# Patient Record
Sex: Female | Born: 1980 | Race: White | Hispanic: No | Marital: Single | Smoking: Current every day smoker
Health system: Southern US, Community
[De-identification: ages and names within clinical notes are randomized; demographics above are authoritative.]

## PROBLEM LIST (undated history)

## (undated) DIAGNOSIS — K219 Gastro-esophageal reflux disease without esophagitis: Secondary | ICD-10-CM

## (undated) DIAGNOSIS — N3010 Interstitial cystitis (chronic) without hematuria: Secondary | ICD-10-CM

## (undated) DIAGNOSIS — K5909 Other constipation: Secondary | ICD-10-CM

## (undated) DIAGNOSIS — K2970 Gastritis, unspecified, without bleeding: Secondary | ICD-10-CM

## (undated) DIAGNOSIS — F329 Major depressive disorder, single episode, unspecified: Secondary | ICD-10-CM

## (undated) DIAGNOSIS — Z8742 Personal history of other diseases of the female genital tract: Secondary | ICD-10-CM

## (undated) DIAGNOSIS — F32A Depression, unspecified: Secondary | ICD-10-CM

## (undated) DIAGNOSIS — J45909 Unspecified asthma, uncomplicated: Secondary | ICD-10-CM

## (undated) DIAGNOSIS — Z87442 Personal history of urinary calculi: Secondary | ICD-10-CM

## (undated) DIAGNOSIS — F419 Anxiety disorder, unspecified: Secondary | ICD-10-CM

## (undated) HISTORY — PX: OTHER SURGICAL HISTORY: SHX169

## (undated) HISTORY — DX: Interstitial cystitis (chronic) without hematuria: N30.10

## (undated) HISTORY — DX: Other constipation: K59.09

## (undated) HISTORY — DX: Personal history of urinary calculi: Z87.442

## (undated) HISTORY — DX: Gastritis, unspecified, without bleeding: K29.70

## (undated) HISTORY — DX: Major depressive disorder, single episode, unspecified: F32.9

## (undated) HISTORY — DX: Gastro-esophageal reflux disease without esophagitis: K21.9

## (undated) HISTORY — DX: Depression, unspecified: F32.A

## (undated) HISTORY — PX: OVARIAN CYST SURGERY: SHX726

## (undated) HISTORY — DX: Unspecified asthma, uncomplicated: J45.909

## (undated) HISTORY — DX: Anxiety disorder, unspecified: F41.9

## (undated) HISTORY — DX: Personal history of other diseases of the female genital tract: Z87.42

---

## 2005-12-22 ENCOUNTER — Ambulatory Visit (HOSPITAL_COMMUNITY): Admission: RE | Admit: 2005-12-22 | Discharge: 2005-12-22 | Payer: Self-pay | Admitting: Urology

## 2008-09-25 ENCOUNTER — Ambulatory Visit: Payer: Self-pay | Admitting: Family Medicine

## 2008-10-09 ENCOUNTER — Ambulatory Visit: Payer: Self-pay | Admitting: Family Medicine

## 2009-07-16 ENCOUNTER — Ambulatory Visit: Payer: Self-pay | Admitting: Family Medicine

## 2010-01-15 ENCOUNTER — Ambulatory Visit: Payer: Self-pay | Admitting: Family Medicine

## 2010-08-27 ENCOUNTER — Emergency Department: Payer: Self-pay | Admitting: Emergency Medicine

## 2010-09-24 ENCOUNTER — Emergency Department: Payer: Self-pay | Admitting: Emergency Medicine

## 2010-10-02 ENCOUNTER — Emergency Department: Payer: Self-pay | Admitting: Emergency Medicine

## 2011-04-30 ENCOUNTER — Emergency Department: Payer: Self-pay | Admitting: Emergency Medicine

## 2011-05-01 ENCOUNTER — Emergency Department: Payer: Self-pay | Admitting: Emergency Medicine

## 2011-05-05 LAB — PATHOLOGY REPORT

## 2011-08-19 DIAGNOSIS — R3915 Urgency of urination: Secondary | ICD-10-CM | POA: Insufficient documentation

## 2011-08-19 DIAGNOSIS — N3010 Interstitial cystitis (chronic) without hematuria: Secondary | ICD-10-CM | POA: Insufficient documentation

## 2011-08-19 DIAGNOSIS — R350 Frequency of micturition: Secondary | ICD-10-CM | POA: Insufficient documentation

## 2011-08-19 DIAGNOSIS — R300 Dysuria: Secondary | ICD-10-CM | POA: Insufficient documentation

## 2011-08-19 DIAGNOSIS — IMO0002 Reserved for concepts with insufficient information to code with codable children: Secondary | ICD-10-CM | POA: Insufficient documentation

## 2011-08-19 DIAGNOSIS — R6882 Decreased libido: Secondary | ICD-10-CM | POA: Insufficient documentation

## 2011-08-19 DIAGNOSIS — R351 Nocturia: Secondary | ICD-10-CM | POA: Insufficient documentation

## 2012-04-27 DIAGNOSIS — N9489 Other specified conditions associated with female genital organs and menstrual cycle: Secondary | ICD-10-CM | POA: Insufficient documentation

## 2012-06-29 DIAGNOSIS — R319 Hematuria, unspecified: Secondary | ICD-10-CM | POA: Insufficient documentation

## 2012-06-29 DIAGNOSIS — IMO0001 Reserved for inherently not codable concepts without codable children: Secondary | ICD-10-CM | POA: Insufficient documentation

## 2012-07-14 LAB — HM PAP SMEAR: HM Pap smear: NORMAL

## 2013-02-23 ENCOUNTER — Encounter: Payer: Self-pay | Admitting: *Deleted

## 2013-02-28 ENCOUNTER — Ambulatory Visit: Payer: Self-pay | Admitting: Cardiovascular Disease

## 2013-03-24 ENCOUNTER — Ambulatory Visit: Payer: Self-pay | Admitting: Cardiovascular Disease

## 2013-05-23 ENCOUNTER — Emergency Department: Payer: Self-pay | Admitting: Emergency Medicine

## 2013-05-31 ENCOUNTER — Ambulatory Visit: Payer: Self-pay | Admitting: Cardiovascular Disease

## 2013-05-31 ENCOUNTER — Telehealth: Payer: Self-pay

## 2013-05-31 NOTE — Telephone Encounter (Signed)
Notified patient can come today to see Dr. Elease Hashimoto at 10:15.

## 2013-05-31 NOTE — Telephone Encounter (Signed)
Message copied by Festus Aloe on Wed May 31, 2013  8:17 AM ------      Message from: Tarri Fuller      Created: Tue May 30, 2013  4:57 PM      Regarding: New Pt appt.       Hi Jovonte Commins,            This pt called after you left last night. She said she was supposed to come in a few months ago as a new pt to see Dr. Kirke Corin. Pt states she had cancelled the 1st appt the she was moving and we did not have the new address or phone #. I have updated her address and phone #.             Pt states her PCP Dr. Sherrie Mustache referred her a few months ago. I have scheduled her for 06/09/13 with Arida as New pt appt. However; pt states she has been having chest tightness, sob on and off for the last year; she is also having left arm weakness, rapid HR around 156, low grade fever, elevated BP. I thought you may want to d/w Dr. Mariah Milling and see what he thinks if pt needs to come in sooner; I think she probably does due to her elevated HR. Have a great day!! Was good to see you!            Thank you      Okey Regal  ------

## 2013-06-09 ENCOUNTER — Ambulatory Visit: Payer: Self-pay | Admitting: Cardiovascular Disease

## 2013-06-13 ENCOUNTER — Encounter: Payer: Self-pay | Admitting: Cardiovascular Disease

## 2013-06-13 ENCOUNTER — Ambulatory Visit (INDEPENDENT_AMBULATORY_CARE_PROVIDER_SITE_OTHER): Payer: Medicaid Other | Admitting: Cardiovascular Disease

## 2013-06-13 VITALS — BP 112/74 | HR 85 | Ht 64.5 in | Wt 162.8 lb

## 2013-06-13 DIAGNOSIS — R002 Palpitations: Secondary | ICD-10-CM | POA: Insufficient documentation

## 2013-06-13 DIAGNOSIS — R0789 Other chest pain: Secondary | ICD-10-CM

## 2013-06-13 NOTE — Progress Notes (Signed)
**Note Martha-Identified via Obfuscation** Martha Guerra Date of Birth  19-May-1981       [facility] Office [address]  [address] [place], [place]  [address]   [place], [place]  [address] [phone]     [phone]   Fax  [phone]    Fax [phone]  Problem List: 1. Chest pain 2. GERD 3. Depression 4, anxiety 5. Ovarian cysts   History of Present Illness:  Martha Guerra is a 32 yo with several cardiac complaints.  She has had a rapid HR for years.  She has trouble sleeping at night because of the rapid HR.  She has constant chest heaviness.  She has had a rapid HR on occasion - was measured at 160 in her urologist's office.    She takes her adderall only as needed and her HR is rapid on occasion even when she does not take the adderall.    Current Outpatient Prescriptions on File Prior to Visit  Medication Sig Dispense Refill  . amphetamine-dextroamphetamine (ADDERALL) 10 MG tablet Take 10 mg by mouth 4 (four) times daily - after meals and at bedtime.      Marland Kitchen esomeprazole (NEXIUM) 40 MG capsule Take 40 mg by mouth daily before breakfast.      . lidocaine 2 % SOLN 12.25 mL with EPINEPHrine 1 MG/ML SOLN 0.25 mL once.       Marland Kitchen oxycodone (OXY-IR) 5 MG capsule Take 5 mg by mouth 4 (four) times daily.      . pentosan polysulfate (ELMIRON) 100 MG capsule Take 100 mg by mouth 2 (two) times daily.       No current facility-administered medications on file prior to visit.    Allergies  Allergen Reactions  . Tylenol [Acetaminophen]     Past Medical History  Diagnosis Date  . Depression   . Chronic constipation   . History of ovarian cyst   . History of kidney stones   . Anxiety   . GERD (gastroesophageal reflux disease)   . Asthma   . Gastritis   . Cystitides, interstitial, chronic     Past Surgical History  Procedure Laterality Date  . Cystectomy    . Ovarian cyst surgery      History  Smoking status  . Current Every Day Smoker -- 1.50 packs/day  for 17 years  . Types: Cigarettes  Smokeless tobacco  . Not on file    History  Alcohol Use No    Family History  Problem Relation Age of Onset  . Heart attack Mother   . Hypertension Father     Reviw of Systems:  Reviewed in the HPI.  All other systems are negative.  Physical Exam: Blood pressure 112/74, pulse 85, height 5' 4.5" (1.638 m), weight 162 lb 12 oz (73.823 kg). General: Well developed, well nourished, in no acute distress.  Head: Normocephalic, atraumatic, sclera non-icteric, mucus membranes are moist,   Neck: Supple. Carotids are 2 + without bruits. No JVD   Lungs: Clear   Heart: RR, normal S1, S2  Abdomen: Soft, non-tender, non-distended with normal bowel sounds.  Msk:  Strength and tone are normal   Extremities: No clubbing or cyanosis. No edema.  Distal pedal pulses are 2+ and equal    Neuro: CN II - XII intact.  Alert and oriented X 3.   Psych:  Normal   ECG: June 13, 2013:  NSR . Normal ECG  Assessment / Plan:

## 2013-06-13 NOTE — Assessment & Plan Note (Addendum)
Martha Guerra presents today for further ablation of her palpitations. She states that she's had a fast heart rate for many years.  In talking with her it is clear that she has a very unhealthy living style. Her diet is completely unrestricted. She drinks up to 4 Vanella  milkshakes each day. She used to drink 4 or 5 bottles of [facility]-  now has decreased it to one bottle day. She smokes one half packs of cigarettes a day. She does not get any regular exercise.  I told her about the possibility of supraventricular tachycardia and explained how to slow heart rate down using the Valsalva maneuver.  I truly think that her heart rate in the 90s are due to her unhealthy living so. I've given her information on the [facility] low glycemic index diet. I've counseled her about eating a low-fat low carbohydrate diet. I've explained her that it may take 2 or 3 months for her to change her diet to more healthy diet. I've explained the importance of regular exercise.  I'll see her again in one month for followup visit. She has been successful in correcting her diet and is still having palpitations we will post monitor on her for further evaluation of her arrhythmias. Place monitor her if she has any serious arrhythmias in the meantime.  Told her to expect to go through some withdrawal. 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

## 2013-06-13 NOTE — Patient Instructions (Addendum)
  [facility] Low Glycemic Diet (Source: [facility], 2006) Low Glycemic Foods (20-49) (Decrease risk of developing heart disease) Breakfast Cereals: All-Bran All-Bran Fruit 'n Oats Fiber One Oatmeal (not instant) Oat bran Fruits and fruit juices: (Limit to 1-2 servings per day) Apples Apricots (fresh & dried) Blackberries Blueberries Cherries Cranberries Peaches Pears Plums Prunes Grapefruit Raspberries Strawberries Tangerine Apple juice Grapefruit juice Tomato juice Beans and legumes (fresh-cooked): Black-eyed peas Butter beans Chick peas Lentils  Green beans Lima beans Kidney beans Navy beans Pinto beans Snow peas Non-starchy vegetables: Asparagus, avocado, broccoli, cabbage, cauliflower, celery, cucumber, greens, lettuce, mushrooms, peppers, tomatoes, okra, onions, spinach, summer squash Grains: Barley Bulgur Rye Wild rice Nuts and oils : Almonds Peanuts Sunflower seeds Hazelnuts Pecans Walnuts Oils that are liquid at room temperature Dairy, fish, meat, soy, and eggs: Milk, skim Lowfat cheese Yogurt, lowfat, fruit sugar sweetened Lean red meat Fish  Skinless chicken & [place] Shellfish Egg whites (up to 3 daily) Soy products  Egg yolks (up to 7 or _____ per week) Moderate Glycemic Foods (50-69) Breakfast Cereals: Bran Buds Bran Chex Just Right Mini-Wheats  Special K Swiss muesli Fruits: Banana (under-ripe) Dates Figs Grapes Kiwi Mango Oranges Raisins Fruit Juices: Cranberry juice Orange juice Beans and legumes: Boston-type baked beans Canned pinto, kidney, or navy beans Green peas Vegetables: Beets Carrots  Sweet potato Yam Corn on the cob Breads: Pita (pocket) bread Oat bran bread Pumpernickel bread Rye bread Wheat bread, high fiber  Grains: Cornmeal Rice, brown Rice, white Couscous Pasta: Macaroni Pizza, cheese Ravioli, meat filled Spaghetti, white  Nuts: Cashews Macadamia Snacks: Chocolate Ice cream, lowfat Muffin  Popcorn High Glycemic Foods (70-100)  Breakfast Cereals: Cheerios Corn Chex Corn Flakes Cream of Wheat Grape Nuts Grape Nut Flakes Grits Nutri-Grain Puffed Rice Puffed Wheat Rice Chex Rice Krispies Shredded Wheat Team Total Fruits: Pineapple Watermelon Banana (over-ripe) Beverages: Sodas, sweet tea, pineapple juice Vegetables: Potato, baked, boiled, fried, mashed Jamaica fries Canned or frozen corn Parsnips Winter squash Breads: Most breads (white and whole grain) Bagels Bread sticks Bread stuffing Kaiser roll Dinner rolls Grains: Rice, instant Tapioca, with milk Candy and most cookies Snacks: Donuts Corn chips Jelly beans Pretzels Pastries    Follow-up in 1 month with Dr Elease Hashimoto.

## 2013-06-20 ENCOUNTER — Telehealth: Payer: Self-pay

## 2013-06-20 NOTE — Telephone Encounter (Signed)
Returned call to patient she stated she has changed her diet and is eating more vegetables,no mountain dews,drinking 6 glasses of water a day.Stated her heart rate is much better ranging under 100 beats/min.Stated B/P is ranging 90/54,93/68.Stated she feels fine.Advised to continue to eat a healthy diet and drink plenty of water.Advised to continue to monitor B/P.Message sent to Dr.Nahser for review.

## 2013-06-20 NOTE — Telephone Encounter (Signed)
Returned call to patient Dr.Nahser is pleased with your progress.Continue the good work.

## 2013-06-20 NOTE — Telephone Encounter (Signed)
Pt states dr Elease Hashimoto put her on a special diet, pt states her BP is 90/54, wonders if it is too low. States she feels ok. Just not sure if this reading is normal. Please call.

## 2013-06-20 NOTE — Telephone Encounter (Signed)
Great.  She just needed some guidance.  Tell her that I'm pleased with her progress.

## 2013-07-19 ENCOUNTER — Ambulatory Visit: Payer: Medicaid Other | Admitting: Cardiovascular Disease

## 2013-07-25 ENCOUNTER — Ambulatory Visit: Payer: Medicaid Other | Admitting: Cardiovascular Disease

## 2013-08-16 ENCOUNTER — Ambulatory Visit: Payer: Self-pay | Admitting: Family Medicine

## 2013-11-03 ENCOUNTER — Encounter: Payer: Self-pay | Admitting: Family Medicine

## 2014-03-09 ENCOUNTER — Ambulatory Visit: Payer: Medicaid Other | Admitting: Cardiovascular Disease

## 2014-03-15 ENCOUNTER — Ambulatory Visit: Payer: Self-pay | Admitting: Family Medicine

## 2014-03-27 ENCOUNTER — Ambulatory Visit (INDEPENDENT_AMBULATORY_CARE_PROVIDER_SITE_OTHER): Payer: Medicaid Other | Admitting: Cardiovascular Disease

## 2014-03-27 ENCOUNTER — Encounter: Payer: Self-pay | Admitting: Cardiovascular Disease

## 2014-03-27 VITALS — BP 102/74 | HR 78 | Ht 64.5 in | Wt 164.2 lb

## 2014-03-27 DIAGNOSIS — R079 Chest pain, unspecified: Secondary | ICD-10-CM

## 2014-03-27 DIAGNOSIS — R002 Palpitations: Secondary | ICD-10-CM

## 2014-03-27 NOTE — Patient Instructions (Addendum)
Increase your intake of fluids (water with electrolyte tabs like Nun tablets, or gatorade) , protein ( hard boiled eggs, chicken, fish) , and a electrolytes ( V-8 juice, salt, potassium chloride  which is sold as No-Salt    Follow up as needed

## 2014-03-27 NOTE — Assessment & Plan Note (Signed)
Martha Guerra is feeling much better.  He's completely stopped eating some and sugar. She's not drinking [employer] or  Milkshakes.  She's having fewer palpitations.  Overall she's doing very well. I've encouraged her to stop smoking which I think will also help her quite a bit.  I'll see her on an as-needed basis.

## 2014-03-27 NOTE — Progress Notes (Signed)
Martha Guerra Date of Birth  1981/08/31       [facility] Office [address], [place]  [address]   [place], [place]  [address] [phone]     [phone]   Fax  [phone]    Fax [phone]  Problem List: 1. Chest pain 2. GERD 3. Depression 4, anxiety 5. Ovarian cysts   History of Present Illness:  Martha Guerra is a 33 yo with several cardiac complaints.  She has had a rapid HR for years.  She has trouble sleeping at night because of the rapid HR.  She has constant chest heaviness.  She has had a rapid HR on occasion - was measured at 160 in her urologist's office.    She takes her adderall only as needed and her HR is rapid on occasion even when she does not take the adderall.    March 27, 2014:  She has changed her diet for the better.  She is avoiding sugar,  Avoiding [facility].   She has had lots of stomach issues.  Liver enzymes are elevate.   Slightly better on PPI Does not drink ETOH.    She is still smoking.  Wants to quit.    Current Outpatient Prescriptions on File Prior to Visit  Medication Sig Dispense Refill  . fesoterodine (TOVIAZ) 8 MG TB24 tablet Take 8 mg by mouth as needed.       Marland Kitchen oxycodone (OXY-IR) 5 MG capsule Take 5 mg by mouth 4 (four) times daily.      . pentosan polysulfate (ELMIRON) 100 MG capsule Take 100 mg by mouth 2 (two) times daily.       No current facility-administered medications on file prior to visit.    Allergies  Allergen Reactions  . Tylenol [Acetaminophen]     Past Medical History  Diagnosis Date  . Depression   . Chronic constipation   . History of ovarian cyst   . History of kidney stones   . Anxiety   . GERD (gastroesophageal reflux disease)   . Asthma   . Gastritis   . Cystitides, interstitial, chronic     Past Surgical History  Procedure Laterality Date  . Cystectomy    . Ovarian cyst surgery      History  Smoking status    . Current Every Day Smoker -- 1.50 packs/day for 17 years  . Types: Cigarettes  Smokeless tobacco  . Not on file    History  Alcohol Use No    Family History  Problem Relation Age of Onset  . Heart attack Mother   . Hypertension Father     Reviw of Systems:  Reviewed in the HPI.  All other systems are negative.  Physical Exam: Blood pressure 102/74, pulse 78, height 5' 4.5" (1.638 m), weight 164 lb 4 oz (74.503 kg). General: Well developed, well nourished, in no acute distress.  Head: Normocephalic, atraumatic, sclera non-icteric, mucus membranes are moist,   Neck: Supple. Carotids are 2 + without bruits. No JVD   Lungs: Clear   Heart: RR, normal S1, S2  Abdomen: Soft, non-tender, non-distended with normal bowel sounds.  Msk:  Strength and tone are normal   Extremities: No clubbing or cyanosis. No edema.  Distal pedal pulses are 2+ and equal    Neuro: CN II - XII intact.  Alert and oriented X 3.   Psych:  Normal  ECG: March 27, 2014:   NSR at 12.  Normal ECG   Assessment / Plan:

## 2014-07-13 ENCOUNTER — Ambulatory Visit: Payer: Self-pay | Admitting: Family Medicine

## 2014-07-17 ENCOUNTER — Ambulatory Visit: Payer: Self-pay | Admitting: Family Medicine

## 2014-08-07 DIAGNOSIS — F411 Generalized anxiety disorder: Secondary | ICD-10-CM | POA: Insufficient documentation

## 2014-08-07 DIAGNOSIS — F17200 Nicotine dependence, unspecified, uncomplicated: Secondary | ICD-10-CM | POA: Insufficient documentation

## 2014-08-07 DIAGNOSIS — J449 Chronic obstructive pulmonary disease, unspecified: Secondary | ICD-10-CM | POA: Insufficient documentation

## 2014-08-07 DIAGNOSIS — R7401 Elevation of levels of liver transaminase levels: Secondary | ICD-10-CM | POA: Insufficient documentation

## 2014-08-07 DIAGNOSIS — F410 Panic disorder [episodic paroxysmal anxiety] without agoraphobia: Secondary | ICD-10-CM | POA: Insufficient documentation

## 2014-08-07 DIAGNOSIS — M549 Dorsalgia, unspecified: Secondary | ICD-10-CM | POA: Insufficient documentation

## 2014-08-07 DIAGNOSIS — K219 Gastro-esophageal reflux disease without esophagitis: Secondary | ICD-10-CM | POA: Insufficient documentation

## 2014-08-07 DIAGNOSIS — F329 Major depressive disorder, single episode, unspecified: Secondary | ICD-10-CM | POA: Insufficient documentation

## 2014-08-07 DIAGNOSIS — F319 Bipolar disorder, unspecified: Secondary | ICD-10-CM | POA: Insufficient documentation

## 2014-08-07 DIAGNOSIS — R1011 Right upper quadrant pain: Secondary | ICD-10-CM | POA: Insufficient documentation

## 2014-08-07 DIAGNOSIS — K5900 Constipation, unspecified: Secondary | ICD-10-CM | POA: Insufficient documentation

## 2014-08-07 DIAGNOSIS — R7402 Elevation of levels of lactic acid dehydrogenase (LDH): Secondary | ICD-10-CM | POA: Insufficient documentation

## 2014-08-07 DIAGNOSIS — R740 Nonspecific elevation of levels of transaminase and lactic acid dehydrogenase [LDH]: Secondary | ICD-10-CM

## 2014-08-15 ENCOUNTER — Ambulatory Visit: Payer: Self-pay | Admitting: Gastroenterology

## 2014-09-20 ENCOUNTER — Ambulatory Visit: Payer: Self-pay | Admitting: Gastroenterology

## 2014-10-09 ENCOUNTER — Ambulatory Visit: Payer: Self-pay | Admitting: Gastroenterology

## 2014-10-25 DIAGNOSIS — Z79891 Long term (current) use of opiate analgesic: Secondary | ICD-10-CM | POA: Insufficient documentation

## 2014-10-25 DIAGNOSIS — R82998 Other abnormal findings in urine: Secondary | ICD-10-CM | POA: Insufficient documentation

## 2014-11-12 ENCOUNTER — Ambulatory Visit: Payer: Self-pay

## 2014-11-13 DIAGNOSIS — M797 Fibromyalgia: Secondary | ICD-10-CM | POA: Insufficient documentation

## 2015-01-07 ENCOUNTER — Ambulatory Visit: Payer: Self-pay | Admitting: Gastroenterology

## 2015-02-11 LAB — SURGICAL PATHOLOGY

## 2015-05-01 ENCOUNTER — Telehealth: Payer: Self-pay | Admitting: Family Medicine

## 2015-05-01 MED ORDER — FLUCONAZOLE 150 MG PO TABS: 150.0000 mg | ORAL_TABLET | Freq: Once | ORAL | Status: DC

## 2015-05-01 NOTE — Telephone Encounter (Signed)
Have sent rx diflucan to [facility] [place]. Ov if not better in 4-5 days

## 2015-05-01 NOTE — Telephone Encounter (Signed)
Pt wants to know if you can call her in something for yeast infection.  Her call back is [place].  Thanks, Con Memos

## 2015-05-01 NOTE — Telephone Encounter (Signed)
Advised patient that prescription has been sent into the pharmacy.

## 2015-05-03 ENCOUNTER — Telehealth: Payer: Self-pay | Admitting: Family Medicine

## 2015-05-03 DIAGNOSIS — B373 Candidiasis of vulva and vagina: Secondary | ICD-10-CM

## 2015-05-03 DIAGNOSIS — B3731 Acute candidiasis of vulva and vagina: Secondary | ICD-10-CM

## 2015-05-03 MED ORDER — TERCONAZOLE 0.4 % VA CREA: 1.0000 | TOPICAL_CREAM | Freq: Every day | VAGINAL | Status: DC

## 2015-05-03 NOTE — Telephone Encounter (Signed)
Sent rx. Please notify patient. Thanks.   

## 2015-05-03 NOTE — Telephone Encounter (Signed)
Pt states she was given a Rx fluconazole (DIFLUCAN) 150 MG for yeast infection and pt took this yesterday.  Pt states about 10 minutes after she took the Rx her stomach started hurting so she made herself throw up to get it off of her stomach.  Pt is requesting a cream or a suppository to help with a yeast infection.  [facility] [place].  [phone]

## 2015-05-16 ENCOUNTER — Other Ambulatory Visit: Payer: Self-pay | Admitting: Family Medicine

## 2015-05-16 NOTE — Telephone Encounter (Signed)
Pt contacted office for refill request on the following medications: Alprazolam 2 mg. Pt stated she contacted [facility] to get her last refill and was advised that she doesn't have any more refills. Pt stated that the pharmacy has new owners and new computer systems and thinks that may be the problem. Pt would like [place] to send in new RX to contact the pharmacy so she can get the last refill. Thanks TNP

## 2015-05-17 NOTE — Telephone Encounter (Signed)
Phoned in to pharmacy. 

## 2015-05-17 NOTE — Telephone Encounter (Signed)
Refill request for Alprazolam 2 mg Last filled by MD on- 03/08/2015 #90 x3 Last Appt: 11/03/2014 Next Appt: none Please advise refill? [place] stated that pt picked-up last refill 05/16/2015.

## 2015-05-17 NOTE — Telephone Encounter (Signed)
It's OK to add 3 additional refills to this prescription so she can have it refilled when it is time. This is not an approval to Refill it Early if it was just filled on 05-16-15. Thanks.

## 2015-05-23 ENCOUNTER — Telehealth: Payer: Self-pay | Admitting: Family Medicine

## 2015-05-23 NOTE — Telephone Encounter (Signed)
Pt called thinking she has intestinal parasite.  Please advise    Thanks,  Con Memos

## 2015-05-23 NOTE — Telephone Encounter (Signed)
Patient called back and stated that her cousin, who has stayed at her house recently was diagnosed with pinworms. Patient said that she did the tape test and found more worms.

## 2015-05-23 NOTE — Telephone Encounter (Signed)
Returned pt's call. Patient stated that she found a worm in her stool this morning. Patient wanted to know want she should do?

## 2015-05-24 NOTE — Telephone Encounter (Signed)
LMOVM for pt to return call 

## 2015-05-24 NOTE — Telephone Encounter (Signed)
She needs to try OTC Pin-X. This is more effective and safer than prescription medications.

## 2015-05-24 NOTE — Telephone Encounter (Signed)
i spoke to patient and told her about the pin x otc.  She said she would try that.  Thanks Con Memos

## 2015-05-28 ENCOUNTER — Telehealth: Payer: Self-pay | Admitting: Family Medicine

## 2015-05-28 MED ORDER — ALBENDAZOLE 200 MG PO TABS: ORAL_TABLET | ORAL | Status: DC

## 2015-05-28 NOTE — Telephone Encounter (Signed)
Cancelled Rx at [facility]. Left message for patient saying that Rx was ready.

## 2015-05-28 NOTE — Telephone Encounter (Signed)
She wants to know if you will please call in something for the pinworms. She has medicaid and does not have the money to buy the over the counter RX.  [facility] [place].    [facility]

## 2015-05-28 NOTE — Telephone Encounter (Signed)
[facility] [place]. Also sent sent prescription to [facility] which was her default pharmacy in [place]. Please cancel the prescription that was sent to [facility] drug. Thanks.

## 2015-06-26 ENCOUNTER — Ambulatory Visit: Payer: Self-pay | Admitting: Family Medicine

## 2015-07-15 ENCOUNTER — Other Ambulatory Visit: Payer: Self-pay

## 2015-07-15 ENCOUNTER — Ambulatory Visit: Payer: Self-pay | Admitting: Gastroenterology

## 2015-07-15 DIAGNOSIS — K2970 Gastritis, unspecified, without bleeding: Secondary | ICD-10-CM | POA: Insufficient documentation

## 2015-07-15 DIAGNOSIS — G4700 Insomnia, unspecified: Secondary | ICD-10-CM | POA: Insufficient documentation

## 2015-07-15 DIAGNOSIS — Z87898 Personal history of other specified conditions: Secondary | ICD-10-CM | POA: Insufficient documentation

## 2015-07-15 DIAGNOSIS — Z87442 Personal history of urinary calculi: Secondary | ICD-10-CM | POA: Insufficient documentation

## 2015-08-15 ENCOUNTER — Ambulatory Visit: Payer: Self-pay | Admitting: Family Medicine

## 2015-08-16 ENCOUNTER — Ambulatory Visit: Payer: Self-pay | Admitting: Family Medicine

## 2015-08-20 ENCOUNTER — Other Ambulatory Visit: Payer: Self-pay

## 2015-08-20 ENCOUNTER — Telehealth: Payer: Self-pay | Admitting: Gastroenterology

## 2015-08-20 DIAGNOSIS — K5900 Constipation, unspecified: Secondary | ICD-10-CM

## 2015-08-20 MED ORDER — LINACLOTIDE 290 MCG PO CAPS: 290.0000 ug | ORAL_CAPSULE | Freq: Every day | ORAL | Status: DC

## 2015-08-20 NOTE — Telephone Encounter (Signed)
patient would like a prescription of linzess 290 called into pharmacy [facility] [place], please call patient once it has bene done

## 2015-08-20 NOTE — Telephone Encounter (Signed)
Rx for Linzess 290mg  sent to pt's pharmacy and pt notified this was done.

## 2015-08-27 ENCOUNTER — Other Ambulatory Visit: Payer: Self-pay

## 2015-08-28 ENCOUNTER — Encounter (INDEPENDENT_AMBULATORY_CARE_PROVIDER_SITE_OTHER): Payer: Self-pay

## 2015-08-28 ENCOUNTER — Encounter: Payer: Self-pay | Admitting: Gastroenterology

## 2015-08-28 ENCOUNTER — Ambulatory Visit (INDEPENDENT_AMBULATORY_CARE_PROVIDER_SITE_OTHER): Payer: Medicaid Other | Admitting: Gastroenterology

## 2015-08-28 VITALS — BP 115/69 | HR 84 | Temp 98.4°F | Ht 61.5 in | Wt 168.0 lb

## 2015-08-28 DIAGNOSIS — R1011 Right upper quadrant pain: Secondary | ICD-10-CM

## 2015-08-28 NOTE — Progress Notes (Signed)
Primary Care Physician: Lelon Huh, MD  Primary Gastroenterologist:  Dr. Lucilla Lame  Chief Complaint  Patient presents with  . Abdominal Pain  . Nausea    HPI: Martha Guerra is a 34 y.o. female here off of her right-sided abdominal pain. The patient thinks it may be something wrong with her liver. The patient reports that she has the nausea with the abdominal pain. With that she took anti-inflammatory medication use warm compresses when she saw me last for the same abdominal pain. She reports that it did not help her symptoms much. As chronic back pain also.  Current Outpatient Prescriptions  Medication Sig Dispense Refill  . alprazolam (XANAX) 2 MG tablet TAKE 1/2 TABLET BY MOUTH FOUR TIMES A DAY AND 1 TABLET AT BEDTIME AS NEEDED  3  . amphetamine-dextroamphetamine (ADDERALL) 10 MG tablet Take by mouth.    . busPIRone (BUSPAR) 15 MG tablet   4  . fesoterodine (TOVIAZ) 8 MG TB24 tablet Take 8 mg by mouth as needed.     . lamoTRIgine (LAMICTAL) 100 MG tablet   4  . lidocaine (XYLOCAINE) 2 % jelly 1 application as needed.    . Linaclotide (LINZESS) 290 MCG CAPS capsule Take 1 capsule (290 mcg total) by mouth daily. 30 capsule 6  . oxycodone (OXY-IR) 5 MG capsule Take 5 mg by mouth 4 (four) times daily.    . pentosan polysulfate (ELMIRON) 100 MG capsule Take 100 mg by mouth 2 (two) times daily.    . TRINTELLIX 20 MG TABS   4  . albendazole (ALBENZA) 200 MG tablet 2 tablets at once, repeat in 2 weeks (Patient not taking: Reported on 08/28/2015) 4 tablet 0  . fluconazole (DIFLUCAN) 150 MG tablet Take 1 tablet (150 mg total) by mouth once. (Patient not taking: Reported on 08/28/2015) 1 tablet 0  . PROAIR HFA 108 (90 BASE) MCG/ACT inhaler TAKE 2 PUFFS BY MOUTH INTO LUNGS EVERY 6 HOURS AS NEEDED FOR WHEEZING  12  . terconazole (TERAZOL 7) 0.4 % vaginal cream Place 1 applicator vaginally at bedtime. (Patient not taking: Reported on 08/28/2015) 45 g 0   No current facility-administered  medications for this visit.    Allergies as of 08/28/2015 - Review Complete 08/28/2015  Allergen Reaction Noted  . Tylenol [acetaminophen]  02/23/2013    ROS:  General: Negative for anorexia, weight loss, fever, chills, fatigue, weakness. ENT: Negative for hoarseness, difficulty swallowing , nasal congestion. CV: Negative for chest pain, angina, palpitations, dyspnea on exertion, peripheral edema.  Respiratory: Negative for dyspnea at rest, dyspnea on exertion, cough, sputum, wheezing.  GI: See history of present illness. GU:  Negative for dysuria, hematuria, urinary incontinence, urinary frequency, nocturnal urination.  Endo: Negative for unusual weight change.    Physical Examination:   BP 115/69 mmHg  Pulse 84  Temp(Src) 98.4 F (36.9 C) (Oral)  Ht 5' 1.5" (1.562 m)  Wt 168 lb (76.204 kg)  BMI 31.23 kg/m2  General: Well-nourished, well-developed in no acute distress.  Eyes: No icterus. Conjunctivae pink. Mouth: Oropharyngeal mucosa moist and pink , no lesions erythema or exudate. Lungs: Clear to auscultation bilaterally. Non-labored. Heart: Regular rate and rhythm, no murmurs rubs or gallops.  Abdomen: The patient's tenderness was reproducible with out touching the abdominal wall and only lifting the leg 6 inches above the exam table. One finger palpation was then applied to the right side of the abdomen which resulted in excruciating tenderness Extremities: No lower extremity edema. No clubbing or deformities.  Neuro: Alert and oriented x 3.  Grossly intact. Skin: Warm and dry, no jaundice.   Psych: Alert and cooperative, normal mood and affect.  Labs:    Imaging Studies: No results found.  Assessment and Plan:   Martha Guerra is a 34 y.o. y/o female who comes in today with continued abdominal pain that was reproduced with out touching the patient but by merely lifting the patient's legs above the exam table thereby flexing the abdominal wall muscles. After doing  this the pain continued and she started to have nausea from the pain. The patient also has chronic back pain. I will check the patient's liver enzymes as per her request because of the right-sided abdominal pain although she has been assured that this is 100% musculoskeletal pain.   Note: This dictation was prepared with Dragon dictation along with smaller phrase technology. Any transcriptional errors that result from this process are unintentional.

## 2015-08-29 LAB — HEPATIC FUNCTION PANEL
ALT: 19 IU/L (ref 0–32)
AST: 18 IU/L (ref 0–40)
Albumin: 4.5 g/dL (ref 3.5–5.5)
Alkaline Phosphatase: 79 IU/L (ref 39–117)
Bilirubin Total: 0.3 mg/dL (ref 0.0–1.2)
Bilirubin, Direct: 0.09 mg/dL (ref 0.00–0.40)
Total Protein: 6.9 g/dL (ref 6.0–8.5)

## 2015-08-30 ENCOUNTER — Telehealth: Payer: Self-pay

## 2015-08-30 NOTE — Telephone Encounter (Signed)
Pt notified of lab results

## 2015-08-30 NOTE — Telephone Encounter (Signed)
-----   Message from Lucilla Lame, MD sent at 08/29/2015  9:57 AM EST ----- The patient know her liver enzymes were normal.

## 2015-09-11 ENCOUNTER — Telehealth: Payer: Self-pay | Admitting: Family Medicine

## 2015-09-11 ENCOUNTER — Other Ambulatory Visit: Payer: Self-pay | Admitting: *Deleted

## 2015-09-11 MED ORDER — ALPRAZOLAM 2 MG PO TABS: ORAL_TABLET | ORAL | Status: DC

## 2015-09-11 NOTE — Telephone Encounter (Signed)
Pt calling stating she got a call from [facility] saying her medication got ejected alprazolam Duanne Moron) 2 MG tablet  cause she was using too pharmacies pt states she only use's [facility], Pt states she did get it refilled this morning 09-11-15 but at [facility] not [place], pt states she is calling to let Dr know so the Dr. Lance Muss thinking she abusing this drug. Pt states to please take [facility] out for her pharmacy.  Thanks CC.

## 2015-09-11 NOTE — Telephone Encounter (Signed)
Please call in alprazolam.  

## 2015-09-11 NOTE — Telephone Encounter (Signed)
Rx called in to pharmacy. 

## 2015-09-13 NOTE — Telephone Encounter (Signed)
Please review. [place] [facility] has been listed as Copy.

## 2015-10-07 ENCOUNTER — Other Ambulatory Visit: Payer: Self-pay | Admitting: Family Medicine

## 2015-10-07 NOTE — Telephone Encounter (Signed)
Pt needs needs new refill on her alprazolam Duanne Moron) 2 MG tablet 09/11/15 -- Birdie Sons, MD TAKE 1/2 TABLET BY MOUTH FOUR TIMES   She uses [facility] in [place]  Her call back [phone]  Thanks,  Con Memos

## 2015-10-08 MED ORDER — ALPRAZOLAM 2 MG PO TABS: ORAL_TABLET | ORAL | Status: DC

## 2015-10-08 NOTE — Telephone Encounter (Signed)
Please call in alprazolam.  

## 2015-10-09 NOTE — Telephone Encounter (Signed)
Rx called in to pharmacy. 

## 2015-11-19 ENCOUNTER — Ambulatory Visit: Payer: Medicaid Other | Admitting: Family Medicine

## 2015-11-26 ENCOUNTER — Ambulatory Visit (INDEPENDENT_AMBULATORY_CARE_PROVIDER_SITE_OTHER): Payer: Medicaid Other | Admitting: Family Medicine

## 2015-11-26 ENCOUNTER — Encounter: Payer: Self-pay | Admitting: Family Medicine

## 2015-11-26 VITALS — BP 130/60 | HR 104 | Temp 99.2°F | Resp 16 | Wt 172.0 lb

## 2015-11-26 DIAGNOSIS — R0981 Nasal congestion: Secondary | ICD-10-CM | POA: Diagnosis not present

## 2015-11-26 MED ORDER — FLUTICASONE PROPIONATE 50 MCG/ACT NA SUSP: 2.0000 | Freq: Every day | NASAL | Status: DC

## 2015-11-26 NOTE — Patient Instructions (Signed)
Use OTC nasal SALINE spray every 3-4 hours throughout the day

## 2015-11-26 NOTE — Progress Notes (Signed)
Patient: Martha Guerra Female    DOB: Jul 17, 1981   35 y.o.   MRN: [id number] Visit Date: 11/26/2015  Today's Provider: Lelon Huh, MD   Chief Complaint  Patient presents with  . Nasal Congestion   Subjective:    HPI   Right side nostril has felt stopped-up  for the last 8 months. Feels like she can't breathe through the right side. It is only on the right side. It feels like her nose is caving in. Has frequent bloody mucoid drainage. No drainage. Has not used any OTC medications or nose sprays. She requests ENT referral for evaluation.   Allergies  Allergen Reactions  . Tylenol [Acetaminophen]    Previous Medications   ALPRAZOLAM (XANAX) 2 MG TABLET    TAKE 1/2 TABLET BY MOUTH FOUR TIMES A DAY AND 1 TABLET AT BEDTIME AS NEEDED   AMPHETAMINE-DEXTROAMPHETAMINE (ADDERALL) 10 MG TABLET    Take by mouth.   BUSPIRONE (BUSPAR) 15 MG TABLET       FESOTERODINE (TOVIAZ) 8 MG TB24 TABLET    Take 8 mg by mouth as needed.    LAMOTRIGINE (LAMICTAL) 100 MG TABLET       LIDOCAINE (XYLOCAINE) 2 % JELLY    1 application as needed.   LINACLOTIDE (LINZESS) 290 MCG CAPS CAPSULE    Take 1 capsule (290 mcg total) by mouth daily.   OXYCODONE (OXY-IR) 5 MG CAPSULE    Take 5 mg by mouth 4 (four) times daily.   PENTOSAN POLYSULFATE (ELMIRON) 100 MG CAPSULE    Take 100 mg by mouth 2 (two) times daily.   PROAIR HFA 108 (90 BASE) MCG/ACT INHALER    TAKE 2 PUFFS BY MOUTH INTO LUNGS EVERY 6 HOURS AS NEEDED FOR WHEEZING   TRINTELLIX 20 MG TABS        Review of Systems  Constitutional: Negative for fever, appetite change and fatigue.  HENT: Positive for congestion and sinus pressure. Negative for postnasal drip, rhinorrhea and sore throat.        Occasional ear congestion  Respiratory: Negative for chest tightness, shortness of breath and wheezing.   Cardiovascular: Negative for chest pain and palpitations.  Gastrointestinal: Negative for nausea, vomiting and abdominal pain.  Neurological:  Positive for headaches. Negative for dizziness and weakness.    Social History  Substance Use Topics  . Smoking status: Current Every Day Smoker -- 1.50 packs/day for 17 years    Types: Cigarettes  . Smokeless tobacco: Never Used  . Alcohol Use: No   Objective:   BP 130/60 mmHg  Pulse 104  Temp(Src) 99.2 F (37.3 C) (Oral)  Resp 16  Wt 172 lb (78.019 kg)  SpO2 97%  LMP 11/24/2015  Physical Exam  General Appearance:    Alert, cooperative, no distress  HENT:   bilateral TM normal without fluid or infection, neck without nodes, right frontal sinus tender and nasal mucosa congested.   Eyes:    PERRL, conjunctiva/corneas clear, EOM's intact       Lungs:     Clear to auscultation bilaterally, respirations unlabored  Heart:    Regular rate and rhythm  Neurologic:   Awake, alert, oriented x 3. No apparent focal neurological           defect.           Assessment & Plan:     1. Nasal congestion  - fluticasone (FLONASE) 50 MCG/ACT nasal spray; Place 2 sprays into both nostrils daily.  Dispense: 16 g; Refill: 6  Patient Instructions  Use OTC nasal SALINE spray every 3-4 hours throughout the day   She requests start ENT referral process while trying nasal spray above.  - Ambulatory referral to ENT        Lelon Huh, MD  [facility]

## 2015-12-25 ENCOUNTER — Other Ambulatory Visit: Payer: Self-pay | Admitting: Psychiatry

## 2015-12-25 DIAGNOSIS — M546 Pain in thoracic spine: Secondary | ICD-10-CM

## 2016-01-16 ENCOUNTER — Ambulatory Visit
Admission: RE | Admit: 2016-01-16 | Discharge: 2016-01-16 | Disposition: A | Discharge status: Home / Self Care | Payer: Medicaid Other | Source: Ambulatory Visit | Attending: Psychiatry | Admitting: Psychiatry

## 2016-01-16 DIAGNOSIS — M546 Pain in thoracic spine: Secondary | ICD-10-CM | POA: Insufficient documentation

## 2016-01-24 ENCOUNTER — Ambulatory Visit: Payer: Medicaid Other | Admitting: Family Medicine

## 2016-02-26 ENCOUNTER — Ambulatory Visit (INDEPENDENT_AMBULATORY_CARE_PROVIDER_SITE_OTHER): Payer: Medicaid Other | Admitting: Family Medicine

## 2016-02-26 ENCOUNTER — Encounter: Payer: Self-pay | Admitting: Family Medicine

## 2016-02-26 VITALS — BP 102/60 | HR 89 | Temp 98.6°F | Resp 16 | Wt 175.0 lb

## 2016-02-26 DIAGNOSIS — N3001 Acute cystitis with hematuria: Secondary | ICD-10-CM | POA: Diagnosis not present

## 2016-02-26 DIAGNOSIS — R309 Painful micturition, unspecified: Secondary | ICD-10-CM

## 2016-02-26 DIAGNOSIS — R5383 Other fatigue: Secondary | ICD-10-CM | POA: Diagnosis not present

## 2016-02-26 DIAGNOSIS — N390 Urinary tract infection, site not specified: Secondary | ICD-10-CM | POA: Insufficient documentation

## 2016-02-26 LAB — POCT URINALYSIS DIPSTICK
Bilirubin, UA: NEGATIVE
Glucose, UA: NEGATIVE
Leukocytes, UA: NEGATIVE
Nitrite, UA: NEGATIVE
Spec Grav, UA: 1.025
Urobilinogen, UA: 0.2
pH, UA: 6

## 2016-02-26 MED ORDER — CIPROFLOXACIN HCL 500 MG PO TABS: 500.0000 mg | ORAL_TABLET | Freq: Two times a day (BID) | ORAL | Status: AC

## 2016-02-26 NOTE — Progress Notes (Signed)
Patient: Martha Guerra Female    DOB: Apr 20, 1981   35 y.o.   MRN: [id number] Visit Date: 02/26/2016  Today's Provider: Lelon Huh, MD   Chief Complaint  Patient presents with  . Nausea  . Back Pain   Subjective:    Dysuria  This is a new problem. The current episode started in the past 7 days (3 days ago). The problem occurs every urination. The quality of the pain is described as burning and aching. The pain is at a severity of 4/10. The pain is moderate. There has been no fever. She is not sexually active. Associated symptoms include chills, flank pain, frequency, nausea and urgency. Pertinent negatives include no discharge, hematuria, hesitancy, possible pregnancy, sweats or vomiting. Treatments tried: pain meds. The treatment provided mild relief. Her past medical history is significant for recurrent UTIs.    Painful urination x3 days. Pain in lower abdomen and lower back. Pain is described as burning, aching. Patient stated that urine has strong odor and is cloudy.   Allergies  Allergen Reactions  . Tylenol [Acetaminophen]    Previous Medications   ALPRAZOLAM (XANAX) 2 MG TABLET    TAKE 1/2 TABLET BY MOUTH FOUR TIMES A DAY AND 1 TABLET AT BEDTIME AS NEEDED   AMPHETAMINE-DEXTROAMPHETAMINE (ADDERALL) 10 MG TABLET    Take by mouth.   BUSPIRONE (BUSPAR) 15 MG TABLET       FESOTERODINE (TOVIAZ) 8 MG TB24 TABLET    Take 8 mg by mouth as needed.    LAMOTRIGINE (LAMICTAL) 100 MG TABLET       LIDOCAINE (XYLOCAINE) 2 % JELLY    1 application as needed.   LINACLOTIDE (LINZESS) 290 MCG CAPS CAPSULE    Take 1 capsule (290 mcg total) by mouth daily.   OXYCODONE (OXY-IR) 5 MG CAPSULE    Take 5 mg by mouth 4 (four) times daily.   PENTOSAN POLYSULFATE (ELMIRON) 100 MG CAPSULE    Take 100 mg by mouth 2 (two) times daily.   PROAIR HFA 108 (90 BASE) MCG/ACT INHALER    Reported on 02/26/2016   TRINTELLIX 20 MG TABS        Review of Systems  Constitutional: Positive for chills.  Negative for appetite change and fatigue.  Respiratory: Negative for chest tightness and shortness of breath.   Cardiovascular: Negative for palpitations.  Gastrointestinal: Positive for nausea. Negative for vomiting.  Genitourinary: Positive for dysuria, urgency, frequency, flank pain and vaginal pain. Negative for hesitancy and hematuria.  Neurological: Negative for dizziness.    Social History  Substance Use Topics  . Smoking status: Current Every Day Smoker -- 1.50 packs/day for 17 years    Types: Cigarettes  . Smokeless tobacco: Never Used  . Alcohol Use: No   Objective:   BP 102/60 mmHg  Pulse 89  Temp(Src) 98.6 F (37 C) (Oral)  Resp 16  Wt 175 lb (79.379 kg)  SpO2 98%  LMP 02/18/2016  Physical Exam  General Appearance:    Alert, cooperative, no distress  Eyes:    PERRL, conjunctiva/corneas clear, EOM's intact       Lungs:     Clear to auscultation bilaterally, respirations unlabored  Heart:    Regular rate and rhythm  Abdomen:   soft, round. Mild suprapubic tenderness. BS + x 4. No CVA tenderness      Assessment & Plan:     1. Painful urination  - POCT urinalysis dipstick - Urine culture  2. Acute  cystitis with hematuria  - Urine culture - ciprofloxacin (CIPRO) 500 MG tablet; Take 1 tablet (500 mg total) by mouth 2 (two) times daily.  Dispense: 14 tablet; Refill: 0  3. Other fatigue  - Comprehensive metabolic panel - CBC - TSH       Lelon Huh, MD  [facility]

## 2016-02-27 LAB — COMPREHENSIVE METABOLIC PANEL
ALT: 50 IU/L — ABNORMAL HIGH (ref 0–32)
AST: 31 IU/L (ref 0–40)
Albumin/Globulin Ratio: 1.5 (ref 1.2–2.2)
Albumin: 4 g/dL (ref 3.5–5.5)
Alkaline Phosphatase: 99 IU/L (ref 39–117)
BUN/Creatinine Ratio: 24 — ABNORMAL HIGH (ref 9–23)
BUN: 14 mg/dL (ref 6–20)
Bilirubin Total: <0.2 mg/dL (ref 0.0–1.2)
CO2: 24 mmol/L (ref 18–29)
Calcium: 9.2 mg/dL (ref 8.7–10.2)
Chloride: 99 mmol/L (ref 96–106)
Creatinine, Ser: 0.59 mg/dL (ref 0.57–1.00)
GFR calc Af Amer: 137 mL/min/{1.73_m2} (ref >59)
GFR calc non Af Amer: 119 mL/min/{1.73_m2} (ref >59)
Globulin, Total: 2.7 g/dL (ref 1.5–4.5)
Glucose: 93 mg/dL (ref 65–99)
Potassium: 4.8 mmol/L (ref 3.5–5.2)
Sodium: 137 mmol/L (ref 134–144)
Total Protein: 6.7 g/dL (ref 6.0–8.5)

## 2016-02-27 LAB — TSH: TSH: 0.658 u[IU]/mL (ref 0.450–4.500)

## 2016-02-27 LAB — CBC
Hematocrit: 43.7 % (ref 34.0–46.6)
Hemoglobin: 14.6 g/dL (ref 11.1–15.9)
MCH: 30 pg (ref 26.6–33.0)
MCHC: 33.4 g/dL (ref 31.5–35.7)
MCV: 90 fL (ref 79–97)
Platelets: 317 10*3/uL (ref 150–379)
RBC: 4.86 x10E6/uL (ref 3.77–5.28)
RDW: 13.6 % (ref 12.3–15.4)
WBC: 7.2 10*3/uL (ref 3.4–10.8)

## 2016-02-28 ENCOUNTER — Telehealth: Payer: Self-pay

## 2016-02-28 ENCOUNTER — Other Ambulatory Visit: Payer: Self-pay

## 2016-02-28 DIAGNOSIS — R309 Painful micturition, unspecified: Secondary | ICD-10-CM

## 2016-02-28 DIAGNOSIS — R319 Hematuria, unspecified: Secondary | ICD-10-CM

## 2016-02-28 LAB — URINE CULTURE: Organism ID, Bacteria: NO GROWTH

## 2016-02-28 NOTE — Telephone Encounter (Signed)
Patient advised. Patient is still having pain and would like to proceed with pelvic ultrasound. Please schedule. Order has been placed in patient chart. Thanks.

## 2016-02-28 NOTE — Telephone Encounter (Signed)
Patient advised and verbally voiced understanding.  

## 2016-02-28 NOTE — Telephone Encounter (Signed)
-----   Message from Birdie Sons, MD sent at 02/28/2016  8:35 AM EDT ----- Urine culture is negative. If pain is not better then she needs pelvic ultrasound for pain and hematuria if pain is better she needs to follow up next after finishing antibiotics to recheck urinalysis.

## 2016-02-28 NOTE — Telephone Encounter (Signed)
Patient advised. Patient states she has been taking the Cipro since Tuesday and it has been upsetting her stomach and causing nausea. Patient states in the past, she has not been able to tolerate the Cipro because of the side effects. Patient would like to have the antibiotic switched to [place]. Patient uses [facility] in [place]. Patient is still having pain and agrees to have the pelvic ultrasound as stated below. Order placed for pelvic ultrasound.

## 2016-02-28 NOTE — Telephone Encounter (Signed)
Spoke with Linus Salmons about patient. Normally we like for a course of antibiotics to be completed once started since unfinished courses of antibiotics is what is believed to build antibiotic resistance. However, if she is not tolerating the antibiotic and being that she has no bacterial growth and no signs of infection on CBC she may just discontinue and await pelvic US. No need to start a new antibiotic without cause and risk other antibiotic resistance developing.

## 2016-03-03 ENCOUNTER — Telehealth: Payer: Self-pay | Admitting: Family Medicine

## 2016-03-03 NOTE — Telephone Encounter (Signed)
FYI--Insurance company denied pelvic ultrasound

## 2016-03-12 ENCOUNTER — Ambulatory Visit (INDEPENDENT_AMBULATORY_CARE_PROVIDER_SITE_OTHER): Payer: Medicaid Other | Admitting: Family Medicine

## 2016-03-12 ENCOUNTER — Telehealth: Payer: Self-pay | Admitting: Family Medicine

## 2016-03-12 ENCOUNTER — Encounter: Payer: Self-pay | Admitting: Family Medicine

## 2016-03-12 VITALS — BP 110/72 | HR 100 | Temp 98.5°F | Resp 16 | Wt 174.0 lb

## 2016-03-12 DIAGNOSIS — L709 Acne, unspecified: Secondary | ICD-10-CM

## 2016-03-12 DIAGNOSIS — N63 Unspecified lump in unspecified breast: Secondary | ICD-10-CM

## 2016-03-12 DIAGNOSIS — N6310 Unspecified lump in the right breast, unspecified quadrant: Secondary | ICD-10-CM

## 2016-03-12 DIAGNOSIS — R102 Pelvic and perineal pain: Secondary | ICD-10-CM

## 2016-03-12 LAB — POCT URINALYSIS DIPSTICK
Bilirubin, UA: NEGATIVE
Glucose, UA: NEGATIVE
Ketones, UA: NEGATIVE
Nitrite, UA: NEGATIVE
Spec Grav, UA: 1.02
Urobilinogen, UA: 0.2
pH, UA: 6

## 2016-03-12 MED ORDER — TRETINOIN 0.05 % EX CREA: TOPICAL_CREAM | Freq: Every day | CUTANEOUS | Status: DC

## 2016-03-12 NOTE — Telephone Encounter (Signed)
Please order as requested by radiology. Lump is in right breast at approximately 7 oclock

## 2016-03-12 NOTE — Telephone Encounter (Signed)
Ok to add order  

## 2016-03-12 NOTE — Progress Notes (Signed)
Patient: Martha Guerra Female    DOB: 1981/08/28   35 y.o.   MRN: [id number] Visit Date: 03/12/2016  Today's Provider: Lelon Huh, MD   Chief Complaint  Patient presents with  . Back Pain   Subjective:    Back Pain This is a recurrent problem. The current episode started more than 1 month ago. The problem occurs intermittently. The problem is unchanged. The quality of the pain is described as cramping and aching. The pain does not radiate. The pain is moderate. The symptoms are aggravated by position. Associated symptoms include bladder incontinence, dysuria, headaches and pelvic pain. Pertinent negatives include no abdominal pain, bowel incontinence, chest pain, fever, leg pain, numbness, paresis, paresthesias, perianal numbness, tingling, weakness or weight loss. Treatments tried: pain medications. The treatment provided moderate relief.   Patient was seen 02/26/2016 for painful urination and hematuria. She was started on Cipro, but stopped a few days later since cultures were negative and it was upsetting her stomach. She states that since then she has had persistent moderate to severe lower pelvic pain that is crampy in nature. Mildly nauseated, normal BMs, no vaginal discharge or bleeding. LMP was first week of May and was normal. She is followed by urology for interstitial cystitis and gyn for history of endometriosis. She has follow up scheduled with Dr. Keturah Barre on June 14th. She states she has not been sexually active for many months.   She is also having pain and tenderness in her right breast for about 2 weeks. Feels like there is a knot in her breast.   She also requests prescription for tretinoin for fascial acne   Allergies  Allergen Reactions  . Tylenol [Acetaminophen]    Previous Medications   ALPRAZOLAM (XANAX) 2 MG TABLET    TAKE 1/2 TABLET BY MOUTH FOUR TIMES A DAY AND 1 TABLET AT BEDTIME AS NEEDED   AMPHETAMINE-DEXTROAMPHETAMINE (ADDERALL) 10 MG TABLET    Take  by mouth.   BUSPIRONE (BUSPAR) 15 MG TABLET       FESOTERODINE (TOVIAZ) 8 MG TB24 TABLET    Take 8 mg by mouth as needed.    LAMOTRIGINE (LAMICTAL) 100 MG TABLET       LIDOCAINE (XYLOCAINE) 2 % JELLY    1 application as needed.   LINACLOTIDE (LINZESS) 290 MCG CAPS CAPSULE    Take 1 capsule (290 mcg total) by mouth daily.   OXYCODONE (OXY-IR) 5 MG CAPSULE    Take 5 mg by mouth 4 (four) times daily.   PENTOSAN POLYSULFATE (ELMIRON) 100 MG CAPSULE    Take 100 mg by mouth 2 (two) times daily.   PROAIR HFA 108 (90 BASE) MCG/ACT INHALER    Reported on 02/26/2016   TRINTELLIX 20 MG TABS        Review of Systems  Constitutional: Negative for fever, chills, weight loss, appetite change and fatigue.  Respiratory: Negative for chest tightness and shortness of breath.   Cardiovascular: Negative for chest pain and palpitations.  Gastrointestinal: Negative for nausea, vomiting, abdominal pain and bowel incontinence.  Genitourinary: Positive for bladder incontinence, dysuria and pelvic pain.  Musculoskeletal: Positive for back pain.  Neurological: Positive for headaches. Negative for dizziness, tingling, weakness, numbness and paresthesias.    Social History  Substance Use Topics  . Smoking status: Current Every Day Smoker -- 1.50 packs/day for 17 years    Types: Cigarettes  . Smokeless tobacco: Never Used  . Alcohol Use: No   Objective:  BP 110/72 mmHg  Pulse 100  Temp(Src) 98.5 F (36.9 C) (Oral)  Resp 16  Wt 174 lb (78.926 kg)  SpO2 98%  LMP 02/18/2016  Physical Exam  Breast Exam. Moderate tenderness with subtle nodule right breast, just lateral to areola at about 7 o'clock. No nipple discharge or bleeding  Abd: Upper abd soft, nontender, non-distended with no massed. Moderate supra pubic tenderness. No masses.     Assessment & Plan:     1. Pelvic pain in female  - POCT urinalysis dipstick - US Pelvis Complete; Future  2. Acne, unspecified acne type rx tretinoin 0.05%  cream  3. Lump of right breast U/s and dx mammogram right breast.    Results for orders placed or performed in visit on 03/12/16  POCT urinalysis dipstick  Result Value Ref Range   Color, UA Yellow    Clarity, UA Slightly Cloudy    Glucose, UA Neg    Bilirubin, UA Neg    Ketones, UA Neg    Spec Grav, UA 1.020    Blood, UA Trace    pH, UA 6.0    Protein, UA Trace    Urobilinogen, UA 0.2    Nitrite, UA Neg    Leukocytes, UA small (1+) (A) Negative       The entirety of the information documented in the History of Present Illness, Review of Systems and Physical Exam were personally obtained by me. Portions of this information were initially documented by Meyer Cory, CMA and reviewed by me for thoroughness and accuracy.    Lelon Huh, MD  [facility]

## 2016-03-12 NOTE — Telephone Encounter (Signed)
Please add order for bilateral diagnostic mammogram with ultrasound of right/left breast.[facility] will also need clock position of breast lump

## 2016-03-13 NOTE — Telephone Encounter (Signed)
Orders in Epic.

## 2016-03-17 ENCOUNTER — Telehealth: Payer: Self-pay | Admitting: *Deleted

## 2016-03-17 DIAGNOSIS — Z1239 Encounter for other screening for malignant neoplasm of breast: Secondary | ICD-10-CM

## 2016-03-17 NOTE — Telephone Encounter (Signed)
-----   Message from Parke Poisson sent at 03/17/2016  8:57 AM EDT ----- Please add ultrasound of left breast limited

## 2016-03-17 NOTE — Telephone Encounter (Signed)
Order in epic. 

## 2016-03-17 NOTE — Telephone Encounter (Signed)
-----   Message from Parke Poisson sent at 03/17/2016  9:46 AM EDT ----- [id number] is the  test we need to order

## 2016-03-18 ENCOUNTER — Telehealth: Payer: Self-pay | Admitting: Family Medicine

## 2016-03-18 NOTE — Telephone Encounter (Signed)
Returned call

## 2016-03-18 NOTE — Telephone Encounter (Signed)
Martha Guerra in Ultrasound needs to talk to you about the order that was entered for [name].  [phone]

## 2016-03-19 ENCOUNTER — Ambulatory Visit: Admission: RE | Admit: 2016-03-19 | Payer: Medicaid Other | Source: Ambulatory Visit

## 2016-03-23 ENCOUNTER — Other Ambulatory Visit: Payer: Self-pay | Admitting: *Deleted

## 2016-03-24 ENCOUNTER — Other Ambulatory Visit: Payer: Medicaid Other

## 2016-03-24 ENCOUNTER — Telehealth: Payer: Self-pay | Admitting: Family Medicine

## 2016-03-24 ENCOUNTER — Ambulatory Visit: Admission: RE | Admit: 2016-03-24 | Payer: Medicaid Other | Source: Ambulatory Visit

## 2016-03-24 MED ORDER — ALPRAZOLAM 2 MG PO TABS: ORAL_TABLET | ORAL | Status: DC

## 2016-03-24 NOTE — Telephone Encounter (Signed)
Rx called in to pharmacy. 

## 2016-03-24 NOTE — Telephone Encounter (Signed)
Patient instructed that she needed an appointment, she states that she would wait she didn't want to come in right now.

## 2016-03-24 NOTE — Telephone Encounter (Signed)
Pt states she is having sinus congestion and pressure.  Pt is requesting a Rx to help with a sinus infection.  [facility] [place].  [phone]

## 2016-03-24 NOTE — Telephone Encounter (Signed)
Please call in lorazepam.  

## 2016-03-30 ENCOUNTER — Other Ambulatory Visit: Payer: Self-pay | Admitting: *Deleted

## 2016-03-30 ENCOUNTER — Other Ambulatory Visit: Payer: Self-pay | Admitting: Family Medicine

## 2016-03-30 DIAGNOSIS — R309 Painful micturition, unspecified: Secondary | ICD-10-CM

## 2016-03-30 DIAGNOSIS — R102 Pelvic and perineal pain: Secondary | ICD-10-CM

## 2016-03-30 DIAGNOSIS — R319 Hematuria, unspecified: Secondary | ICD-10-CM

## 2016-03-31 ENCOUNTER — Ambulatory Visit: Admission: RE | Admit: 2016-03-31 | Payer: Medicaid Other | Source: Ambulatory Visit

## 2016-04-01 ENCOUNTER — Ambulatory Visit: Payer: Medicaid Other | Admitting: Obstetrics and Gynecology

## 2016-04-10 ENCOUNTER — Inpatient Hospital Stay: Admission: RE | Admit: 2016-04-10 | Payer: Medicaid Other | Source: Ambulatory Visit

## 2016-04-10 ENCOUNTER — Ambulatory Visit: Payer: Medicaid Other | Attending: Family Medicine

## 2016-04-10 ENCOUNTER — Other Ambulatory Visit: Payer: Medicaid Other

## 2016-04-20 ENCOUNTER — Other Ambulatory Visit: Payer: Self-pay | Admitting: Family Medicine

## 2016-04-20 NOTE — Telephone Encounter (Signed)
Pt contacted office for refill request on the following medications:  alprazolam (XANAX) 2 MG tablet.  [facility].  CB#[phone]/MW

## 2016-04-20 NOTE — Telephone Encounter (Signed)
Refills are available at pharmacy for alprazolam. Confirmed.

## 2016-04-27 ENCOUNTER — Ambulatory Visit: Payer: Medicaid Other | Attending: Family Medicine

## 2016-08-14 ENCOUNTER — Ambulatory Visit: Payer: Medicaid Other | Admitting: Family Medicine

## 2016-09-18 ENCOUNTER — Other Ambulatory Visit: Payer: Self-pay | Admitting: *Deleted

## 2016-09-18 MED ORDER — ALPRAZOLAM 2 MG PO TABS: ORAL_TABLET | ORAL | 5 refills | Status: DC

## 2016-09-18 NOTE — Telephone Encounter (Signed)
Called pharmacy x 2. Phone call keeps getting hung up-aa

## 2016-09-18 NOTE — Telephone Encounter (Signed)
Please call in alprazolam.  

## 2016-09-21 NOTE — Telephone Encounter (Signed)
Rx called in to pharmacy. 

## 2016-10-07 ENCOUNTER — Other Ambulatory Visit: Payer: Self-pay | Admitting: Family Medicine

## 2016-10-07 DIAGNOSIS — K5900 Constipation, unspecified: Secondary | ICD-10-CM

## 2016-10-07 NOTE — Telephone Encounter (Signed)
This medication is prescribed by Dr. Allen Norris. Patient will need to contact him for refills. Looks like he last filled it 08/20/2015 with qty 30 with 6 refills. Tried calling patient to advise her of this. Left a message to call back.

## 2016-10-07 NOTE — Telephone Encounter (Signed)
Pt needs refill on   Linaclotide (LINZESS) 290 MCG CAPS capsule  Taking 08/20/15 -- Lucilla Lame, MD    Take 1 capsule (290 mcg total) by mouth daily.     [facility]

## 2016-10-07 NOTE — Telephone Encounter (Signed)
Please review. Thanks!  

## 2016-10-08 NOTE — Telephone Encounter (Signed)
Patient advised as below.  

## 2016-10-15 ENCOUNTER — Other Ambulatory Visit: Payer: Self-pay

## 2016-10-15 ENCOUNTER — Telehealth: Payer: Self-pay | Admitting: Gastroenterology

## 2016-10-15 DIAGNOSIS — K5900 Constipation, unspecified: Secondary | ICD-10-CM

## 2016-10-15 MED ORDER — LINACLOTIDE 290 MCG PO CAPS: 290.0000 ug | ORAL_CAPSULE | Freq: Every day | ORAL | 6 refills | Status: DC

## 2016-10-15 NOTE — Telephone Encounter (Signed)
Left vm notifying Pt rx for Linzess was sent to pharmacy.

## 2016-10-15 NOTE — Telephone Encounter (Signed)
linzess 290 mcg - Can she get a refill? Her stomach is beginning to bother her again and this is the only thing that helps and she is out. Please call [facility] in [place]

## 2016-12-25 ENCOUNTER — Ambulatory Visit (INDEPENDENT_AMBULATORY_CARE_PROVIDER_SITE_OTHER): Payer: Medicaid Other | Admitting: Family Medicine

## 2016-12-25 ENCOUNTER — Encounter: Payer: Self-pay | Admitting: Family Medicine

## 2016-12-25 VITALS — BP 100/60 | HR 98 | Temp 98.8°F | Resp 16 | Wt 195.0 lb

## 2016-12-25 DIAGNOSIS — R519 Headache, unspecified: Secondary | ICD-10-CM

## 2016-12-25 DIAGNOSIS — R51 Headache: Secondary | ICD-10-CM

## 2016-12-25 DIAGNOSIS — G8929 Other chronic pain: Secondary | ICD-10-CM

## 2016-12-25 MED ORDER — AMITRIPTYLINE HCL 10 MG PO TABS: ORAL_TABLET | ORAL | 1 refills | Status: DC

## 2016-12-25 NOTE — Progress Notes (Signed)
Patient: Martha Guerra Female    DOB: Dec 13, 1980   36 y.o.   MRN: [id number] Visit Date: 12/25/2016  Today's Provider: Lelon Huh, MD   Chief Complaint  Patient presents with  . Headache   Subjective:    Headache   This is a chronic problem. Episode onset: 1 year ago. The problem occurs intermittently. The problem has been gradually worsening. The pain is located in the bilateral region. The pain quality is similar to prior headaches. The quality of the pain is described as throbbing. Pertinent negatives include no abdominal pain, abnormal behavior, anorexia, back pain, blurred vision, coughing, dizziness, drainage, ear pain, eye pain, eye redness, eye watering, facial sweating, fever, hearing loss, insomnia, loss of balance, muscle aches, nausea, neck pain, numbness, phonophobia, photophobia, rhinorrhea, seizures, sinus pressure, sore throat, swollen glands, tingling, tinnitus, visual change, vomiting, weakness or weight loss. The symptoms are aggravated by bright light and emotional stress. Treatments tried: Botox injections. The treatment provided moderate relief.   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 Is now having headaches across both sides of forehead and temples nearly everyday. She is under a lot of stress since her father is dying of bone cancer.      Allergies  Allergen Reactions  . Tylenol [Acetaminophen]      Current Outpatient Prescriptions:  .  alprazolam (XANAX) 2 MG tablet, TAKE 1/2 TABLET BY MOUTH FOUR TIMES A DAY AND 1 TABLET AT BEDTIME AS NEEDED, Disp: 90 tablet, Rfl: 5 .  amphetamine-dextroamphetamine (ADDERALL) 10 MG tablet, Take by mouth., Disp: , Rfl:  .  busPIRone (BUSPAR) 15 MG tablet, , Disp: , Rfl: 4 .  fesoterodine (TOVIAZ) 8 MG TB24 tablet, Take 8 mg by mouth as needed. , Disp: , Rfl:  .  lamoTRIgine (LAMICTAL) 100 MG tablet, , Disp: , Rfl: 4 .   lidocaine (XYLOCAINE) 2 % jelly, 1 application as needed., Disp: , Rfl:  .  linaclotide (LINZESS) 290 MCG CAPS capsule, Take 1 capsule (290 mcg total) by mouth daily., Disp: 30 capsule, Rfl: 6 .  oxycodone (OXY-IR) 5 MG capsule, Take 5 mg by mouth 4 (four) times daily., Disp: , Rfl:  .  pentosan polysulfate (ELMIRON) 100 MG capsule, Take 100 mg by mouth 2 (two) times daily., Disp: , Rfl:  .  tretinoin (RETIN-A) 0.05 % cream, Apply topically at bedtime., Disp: 45 g, Rfl: 1 .  TRINTELLIX 20 MG TABS, , Disp: , Rfl: 4  Review of Systems  Constitutional: Negative for appetite change, chills, fatigue, fever and weight loss.  HENT: Negative for ear pain, hearing loss, rhinorrhea, sinus pressure, sore throat and tinnitus.   Eyes: Negative for blurred vision, photophobia, pain and redness.  Respiratory: Negative for cough, chest tightness and shortness of breath.   Cardiovascular: Negative for chest pain and palpitations.  Gastrointestinal: Negative for abdominal pain, anorexia, nausea and vomiting.  Musculoskeletal: Negative for back pain and neck pain.  Neurological: Positive for headaches. Negative for dizziness, tingling, seizures, weakness, numbness and loss of balance.  Psychiatric/Behavioral: The patient does not have insomnia.     Social History  Substance Use Topics  . Smoking status: Current Every Day Smoker    Packs/day: 0.50    Years: 17.00    Types: Cigarettes  . Smokeless tobacco: Never Used  . Alcohol use No   Objective:   BP 100/60 (BP  Location: Left Arm, Patient Position: Sitting, Cuff Size: Large)   Pulse 98   Temp 98.8 F (37.1 C) (Oral)   Resp 16   Wt 195 lb (88.5 kg)   SpO2 97% Comment: room air  BMI 36.25 kg/m  Vitals:   12/25/16 1129  Resp: 16  Weight: 195 lb (88.5 kg)     Physical Exam   General Appearance:    Alert, cooperative, no distress  Eyes:    PERRL, conjunctiva/corneas clear, EOM's intact       Lungs:     Clear to auscultation bilaterally,  respirations unlabored  Heart:    Regular rate and rhythm  Neurologic:   Awake, alert, oriented x 3. No apparent focal neurological           defect.           Assessment & Plan:     1. Chronic intractable headache, unspecified headache type Responded well to botux injections injection in the past. Will see if we get her with headache clinic for botux injection that takes Medicaid.  In the mean time will start 10mg  TCA and titrate slowly to effective dose. Call if any problems from medication.  - amitriptyline (ELAVIL) 10 MG tablet; Take 1-5 tablets at before bedtime  Dispense: 100 tablet; Refill: 1 - AMB referral to headache clinic       Lelon Huh, MD  [facility]

## 2017-02-05 DIAGNOSIS — R51 Headache: Secondary | ICD-10-CM | POA: Diagnosis not present

## 2017-02-05 DIAGNOSIS — M542 Cervicalgia: Secondary | ICD-10-CM | POA: Diagnosis not present

## 2017-02-05 DIAGNOSIS — M5481 Occipital neuralgia: Secondary | ICD-10-CM | POA: Diagnosis not present

## 2017-02-05 DIAGNOSIS — G245 Blepharospasm: Secondary | ICD-10-CM | POA: Diagnosis not present

## 2017-02-05 LAB — VITAMIN D 25 HYDROXY (VIT D DEFICIENCY, FRACTURES): Vit D, 25-Hydroxy: 7.8

## 2017-03-19 ENCOUNTER — Other Ambulatory Visit: Payer: Self-pay | Admitting: *Deleted

## 2017-03-19 MED ORDER — ALPRAZOLAM 2 MG PO TABS: ORAL_TABLET | ORAL | 5 refills | Status: DC

## 2017-03-19 NOTE — Telephone Encounter (Signed)
Please call in alprazolam.  

## 2017-03-20 NOTE — Telephone Encounter (Signed)
Called in. Martha Guerra, CMA  

## 2017-04-29 ENCOUNTER — Other Ambulatory Visit: Payer: Self-pay | Admitting: Family Medicine

## 2017-04-29 DIAGNOSIS — G8929 Other chronic pain: Secondary | ICD-10-CM

## 2017-04-29 DIAGNOSIS — M545 Low back pain: Principal | ICD-10-CM

## 2017-06-30 ENCOUNTER — Ambulatory Visit
Admission: RE | Admit: 2017-06-30 | Discharge: 2017-06-30 | Disposition: A | Discharge status: Home / Self Care | Payer: Medicaid Other | Source: Ambulatory Visit | Attending: Family Medicine | Admitting: Family Medicine

## 2017-06-30 DIAGNOSIS — M545 Low back pain: Secondary | ICD-10-CM

## 2017-06-30 DIAGNOSIS — G8929 Other chronic pain: Secondary | ICD-10-CM | POA: Diagnosis present

## 2017-06-30 DIAGNOSIS — M5127 Other intervertebral disc displacement, lumbosacral region: Secondary | ICD-10-CM | POA: Diagnosis not present

## 2017-07-09 ENCOUNTER — Ambulatory Visit (INDEPENDENT_AMBULATORY_CARE_PROVIDER_SITE_OTHER): Payer: Medicaid Other | Admitting: Physician Assistant

## 2017-07-09 ENCOUNTER — Encounter: Payer: Self-pay | Admitting: Physician Assistant

## 2017-07-09 VITALS — BP 104/60 | HR 80 | Temp 98.0°F | Wt 187.0 lb

## 2017-07-09 DIAGNOSIS — R062 Wheezing: Secondary | ICD-10-CM

## 2017-07-09 DIAGNOSIS — R918 Other nonspecific abnormal finding of lung field: Secondary | ICD-10-CM | POA: Diagnosis not present

## 2017-07-09 DIAGNOSIS — J40 Bronchitis, not specified as acute or chronic: Secondary | ICD-10-CM | POA: Diagnosis not present

## 2017-07-09 DIAGNOSIS — L301 Dyshidrosis [pompholyx]: Secondary | ICD-10-CM

## 2017-07-09 DIAGNOSIS — J0110 Acute frontal sinusitis, unspecified: Secondary | ICD-10-CM | POA: Diagnosis not present

## 2017-07-09 MED ORDER — ALBUTEROL SULFATE HFA 108 (90 BASE) MCG/ACT IN AERS: 2.0000 | INHALATION_SPRAY | Freq: Four times a day (QID) | RESPIRATORY_TRACT | 2 refills | Status: DC | PRN

## 2017-07-09 MED ORDER — TRIAMCINOLONE ACETONIDE 0.1 % EX CREA: 1.0000 "application " | TOPICAL_CREAM | Freq: Two times a day (BID) | CUTANEOUS | 0 refills | Status: DC

## 2017-07-09 MED ORDER — DOXYCYCLINE HYCLATE 100 MG PO TABS: 100.0000 mg | ORAL_TABLET | Freq: Two times a day (BID) | ORAL | 0 refills | Status: AC

## 2017-07-09 NOTE — Progress Notes (Signed)
[place]  Chief Complaint  Patient presents with  . Sinusitis    Started about two weeks ago.  Marland Kitchen URI    Subjective:    Patient ID: Martha Guerra, female    DOB: [birth year], 36 y.o.   MRN: [id number]  Upper Respiratory Infection: Martha Guerra is a 36 y.o. female with a past medical history significant for pack per day smoking, lung nodules, and recent sick contacts complaining of symptoms of a URI, possible sinusitis. Symptoms include right ear pain, congestion and cough. Onset of symptoms was 2 weeks ago, gradually worsening since that time. She also c/o congestion, cough described as productive, lightheadedness and post nasal drip for the past 2 weeks .  She is drinking plenty of fluids. Evaluation to date: none. Treatment to date: decongestants. The treatment has provided minimal relief.   Also c/o bumps on the palm of her hands that are itchy and painful. Get worse with sweating. She pops them and they spread.  Also needs another referral for pulmonology. She had previously seen Dr. Renee Harder for lung nodules and hasn't followed up for two years.  Review of Systems  Constitutional: Positive for fatigue. Negative for activity change, appetite change, chills, diaphoresis, fever and unexpected weight change.  HENT: Positive for congestion, ear pain (Right ear pain), postnasal drip, rhinorrhea, sinus pain, sinus pressure and voice change. Negative for ear discharge, nosebleeds, sneezing, sore throat, tinnitus and trouble swallowing.   Eyes: Negative.   Respiratory: Positive for cough, chest tightness, shortness of breath and wheezing. Negative for apnea, choking and stridor.   Gastrointestinal: Negative.   Neurological: Positive for light-headedness. Negative for dizziness and headaches.       Objective:   BP 104/60 (BP Location: Left Arm, Patient Position: Sitting, Cuff Size: Normal)   Pulse 80   Temp 98 F (36.7 C) (Oral)    Wt 187 lb (84.8 kg)   LMP 06/26/2017   SpO2 93%   BMI 34.76 kg/m   Patient Active Problem List   Diagnosis Date Noted  . Urinary tract infection 02/26/2016  . Cannot sleep 07/15/2015  . H/O disease 07/15/2015  . H/O urinary stone 07/15/2015  . Gastric catarrh 07/15/2015  . Anxiety state 08/07/2014  . Chronic interstitial cystitis 08/07/2014  . CAFL (chronic airflow limitation) ([place]) 08/07/2014  . Acid reflux 08/07/2014  . Compulsive tobacco user syndrome 08/07/2014  . Depression, major, single episode 08/07/2014  . Palpitations 06/13/2013    Outpatient Encounter Prescriptions as of 07/09/2017  Medication Sig Note  . alprazolam (XANAX) 2 MG tablet TAKE 1/2 TABLET BY MOUTH FOUR TIMES A DAY AND 1 TABLET AT BEDTIME AS NEEDED   . amitriptyline (ELAVIL) 10 MG tablet Take 1-5 tablets at before bedtime   . amphetamine-dextroamphetamine (ADDERALL) 10 MG tablet Take by mouth. 06/26/2015: Received from: [facility]  . busPIRone (BUSPAR) 15 MG tablet  08/27/2015: Received from: External Pharmacy  . fesoterodine (TOVIAZ) 8 MG TB24 tablet Take 8 mg by mouth as needed.    . lamoTRIgine (LAMICTAL) 100 MG tablet  08/27/2015: Received from: External Pharmacy  . lidocaine (XYLOCAINE) 2 % jelly 1 application as needed.   . linaclotide (LINZESS) 290 MCG CAPS capsule Take 1 capsule (290 mcg total) by mouth daily.   Marland Kitchen oxycodone (OXY-IR) 5 MG capsule Take 5 mg by mouth 4 (four) times daily.   . pentosan polysulfate (ELMIRON) 100 MG capsule Take 100 mg by mouth 2 (two) times daily.   Marland Kitchen  tretinoin (RETIN-A) 0.05 % cream Apply topically at bedtime.   Marland Kitchen albuterol (PROVENTIL HFA;VENTOLIN HFA) 108 (90 Base) MCG/ACT inhaler Inhale 2 puffs into the lungs every 6 (six) hours as needed for wheezing or shortness of breath.   . doxycycline (VIBRA-TABS) 100 MG tablet Take 1 tablet (100 mg total) by mouth 2 (two) times daily.   Marland Kitchen triamcinolone cream (KENALOG) 0.1 % Apply 1 application topically 2 (two)  times daily.   . TRINTELLIX 20 MG TABS  08/27/2015: Received from: External Pharmacy   No facility-administered encounter medications on file as of 07/09/2017.     Allergies  Allergen Reactions  . Tylenol [Acetaminophen]        Physical Exam  Constitutional: She is oriented to person, place, and time. She appears well-developed.  Cardiovascular: Normal rate and regular rhythm.   Pulmonary/Chest: Effort normal. She has wheezes. She has no rales.  Wheezing in bilateral lung fields. Rhonchi in bilateral lung fields that clears with coughing.  Neurological: She is alert and oriented to person, place, and time.  Skin: Skin is warm and dry.  Fluid filled bumps on palms of hands.  Psychiatric: She has a normal mood and affect. Her behavior is normal.       Assessment & Plan:   1. Bronchitis  Declines prednisone. Will do inhaler.  2. Acute frontal sinusitis, recurrence not specified  - doxycycline (VIBRA-TABS) 100 MG tablet; Take 1 tablet (100 mg total) by mouth 2 (two) times daily.  Dispense: 20 tablet; Refill: 0  3. Multiple lung nodules  - Ambulatory referral to Pulmonology  4. Dyshidrotic eczema  - triamcinolone cream (KENALOG) 0.1 %; Apply 1 application topically 2 (two) times daily.  Dispense: 30 g; Refill: 0  5. Wheezing  - albuterol (PROVENTIL HFA;VENTOLIN HFA) 108 (90 Base) MCG/ACT inhaler; Inhale 2 puffs into the lungs every 6 (six) hours as needed for wheezing or shortness of breath.  Dispense: 1 Inhaler; Refill: 2  Return if symptoms worsen or fail to improve.    Patient Instructions  Acute Bronchitis, Adult Acute bronchitis is when air tubes (bronchi) in the lungs suddenly get swollen. The condition can make it hard to breathe. It can also cause these symptoms:  A cough.  Coughing up clear, yellow, or green mucus.  Wheezing.  Chest congestion.  Shortness of breath.  A fever.  Body aches.  Chills.  A sore throat.  Follow these instructions at  home: Medicines  Take over-the-counter and prescription medicines only as told by your doctor.  If you were prescribed an antibiotic medicine, take it as told by your doctor. Do not stop taking the antibiotic even if you start to feel better. General instructions  Rest.  Drink enough fluids to keep your pee (urine) clear or pale yellow.  Avoid smoking and secondhand smoke. If you smoke and you need help quitting, ask your doctor. Quitting will help your lungs heal faster.  Use an inhaler, cool mist vaporizer, or humidifier as told by your doctor.  Keep all follow-up visits as told by your doctor. This is important. How is this prevented? To lower your risk of getting this condition again:  Wash your hands often with soap and water. If you cannot use soap and water, use hand sanitizer.  Avoid contact with people who have cold symptoms.  Try not to touch your hands to your mouth, nose, or eyes.  Make sure to get the flu shot every year.  Contact a doctor if:  Your symptoms do  not get better in 2 weeks. Get help right away if:  You cough up blood.  You have chest pain.  You have very bad shortness of breath.  You become dehydrated.  You faint (pass out) or keep feeling like you are going to pass out.  You keep throwing up (vomiting).  You have a very bad headache.  Your fever or chills gets worse. This information is not intended to replace advice given to you by your health care provider. Make sure you discuss any questions you have with your health care provider. Document Released: 03/23/2008 Document Revised: 05/13/2016 Document Reviewed: 03/25/2016 Elsevier Interactive Patient Education  2017 [employer].     The entirety of the information documented in the History of Present Illness, Review of Systems and Physical Exam were personally obtained by me. Portions of this information were initially documented by Ashley Royalty, CMA and reviewed by me for  thoroughness and accuracy.

## 2017-07-09 NOTE — Patient Instructions (Signed)

## 2017-07-15 ENCOUNTER — Encounter: Payer: Self-pay | Admitting: *Deleted

## 2017-10-06 ENCOUNTER — Other Ambulatory Visit: Payer: Self-pay | Admitting: Family Medicine

## 2018-01-24 ENCOUNTER — Ambulatory Visit: Payer: Self-pay | Admitting: Physician Assistant

## 2018-01-28 ENCOUNTER — Other Ambulatory Visit: Payer: Self-pay | Admitting: Family Medicine

## 2018-01-28 MED ORDER — ALPRAZOLAM 2 MG PO TABS: ORAL_TABLET | ORAL | 3 refills | Status: DC

## 2018-01-28 NOTE — Telephone Encounter (Signed)
Please review. Thanks!  

## 2018-01-28 NOTE — Telephone Encounter (Signed)
Pt contacted office for refill request on the following medications:  alprazolam Duanne Moron) 2 MG tablet  [place]  Last Rx: 10/07/17 with 3 refills Pt stated she will be out of the medication this weekend and her pharmacy is closed on Sunday. Pt is requesting Rx be sent in today if possible. Pt was advised that refill request can take 24 to 48 hours. Please advise. Thanks TNP

## 2018-01-30 ENCOUNTER — Other Ambulatory Visit: Payer: Self-pay | Admitting: Family Medicine

## 2018-01-31 ENCOUNTER — Other Ambulatory Visit: Payer: Self-pay | Admitting: Family Medicine

## 2018-01-31 NOTE — Telephone Encounter (Signed)
[facility] faxed a refill request for the following medication. Thanks CC  alprazolam (XANAX) 2 MG tablet

## 2018-01-31 NOTE — Telephone Encounter (Signed)
Was sent to pharmacy earlier today. Ok to dispens 90 with no additional refills. Patient needs to schedule o.v. Before any additional refills can be approved.

## 2018-02-07 NOTE — Telephone Encounter (Signed)
Patient advised that she needs to schedule an office visit. She has an appointment to see Adrianna on Thursday 02/10/2018, and she states she would schedule an appointment with Fisher while she was in the office.

## 2018-02-10 ENCOUNTER — Ambulatory Visit: Payer: Self-pay | Admitting: Physician Assistant

## 2018-02-23 ENCOUNTER — Encounter: Payer: Self-pay | Admitting: Family Medicine

## 2018-02-23 ENCOUNTER — Ambulatory Visit: Payer: Medicaid Other | Admitting: Family Medicine

## 2018-02-23 VITALS — BP 104/60 | HR 103 | Temp 98.2°F | Resp 16 | Wt 190.0 lb

## 2018-02-23 DIAGNOSIS — F411 Generalized anxiety disorder: Secondary | ICD-10-CM

## 2018-02-23 DIAGNOSIS — R490 Dysphonia: Secondary | ICD-10-CM | POA: Diagnosis not present

## 2018-02-23 DIAGNOSIS — K219 Gastro-esophageal reflux disease without esophagitis: Secondary | ICD-10-CM | POA: Diagnosis not present

## 2018-02-23 DIAGNOSIS — L301 Dyshidrosis [pompholyx]: Secondary | ICD-10-CM | POA: Diagnosis not present

## 2018-02-23 MED ORDER — OMEPRAZOLE 40 MG PO CPDR: 40.0000 mg | DELAYED_RELEASE_CAPSULE | Freq: Every day | ORAL | 3 refills | Status: DC

## 2018-02-23 MED ORDER — TRIAMCINOLONE ACETONIDE 0.1 % EX LOTN: 1.0000 "application " | TOPICAL_LOTION | Freq: Two times a day (BID) | CUTANEOUS | 1 refills | Status: DC

## 2018-02-23 MED ORDER — MONTELUKAST SODIUM 10 MG PO TABS
10.0000 mg | ORAL_TABLET | Freq: Every day | ORAL | 3 refills | Status: DC
Start: 2018-02-23 — End: 2019-10-10

## 2018-02-23 MED ORDER — ALPRAZOLAM 2 MG PO TABS: ORAL_TABLET | ORAL | 3 refills | Status: DC

## 2018-02-23 NOTE — Progress Notes (Signed)
Patient: Martha Guerra Female    DOB: 09-Feb-1981   37 y.o.   MRN: [id number] Visit Date: 02/23/2018  Today's Provider: Lelon Huh, MD   Chief Complaint  Patient presents with  . Follow-up  . Anxiety   Subjective:    HPI  Anxiety Current treatment alprazolam pm. She reports it continues to work well. She is being followed by Dr. Kasandra Knudsen for depression, but he she states she prefers to have alprazolam refilled here, but Dr. Kasandra Knudsen is aware that she is taking it.   Follow up hoarseness Patient saw Carles Collet 07/09/2017 for bronchitis. Patient states she was having hoarseness then and it has not gotten better.States she gets heartburn every day. Previously diagnosed GERD by GI years ago. Is taking generic Zantac 150 every day which helps but she still has some heartburn most days.    Patient also had blisters on her hands off and on and was prescribed kenalog cream. Patient still has the blisters and Kenalog cream did not help.  She feels like it just rubs off her hands.    Allergies  Allergen Reactions  . Tylenol [Acetaminophen]      Current Outpatient Medications:  .  alprazolam (XANAX) 2 MG tablet, TAKE 1/2 TABLET BY MOUTH FOUR TIMES DAILY AND TAKE ONE TABLET BY MOUTH AT BEDTIME AS NEEDED, Disp: 90 tablet, Rfl: 3 .  amphetamine-dextroamphetamine (ADDERALL) 10 MG tablet, Take by mouth., Disp: , Rfl:  .  lamoTRIgine (LAMICTAL) 100 MG tablet, , Disp: , Rfl: 4 .  lidocaine (XYLOCAINE) 2 % jelly, 1 application as needed., Disp: , Rfl:  .  linaclotide (LINZESS) 290 MCG CAPS capsule, Take 1 capsule (290 mcg total) by mouth daily., Disp: 30 capsule, Rfl: 6 .  oxycodone (OXY-IR) 5 MG capsule, Take 5 mg by mouth 4 (four) times daily., Disp: , Rfl:  .  pentosan polysulfate (ELMIRON) 100 MG capsule, Take 100 mg by mouth 2 (two) times daily., Disp: , Rfl:  .  tretinoin (RETIN-A) 0.05 % cream, Apply topically at bedtime., Disp: 45 g, Rfl: 1 .  TRINTELLIX 20 MG TABS, , Disp: , Rfl:  4 .  triamcinolone cream (KENALOG) 0.1 %, Apply 1 application topically 2 (two) times daily. (Patient not taking: Reported on 02/23/2018), Disp: 30 g, Rfl: 0  Review of Systems  Constitutional: Negative for appetite change, chills, fatigue and fever.  Respiratory: Negative for chest tightness and shortness of breath.   Cardiovascular: Negative for chest pain and palpitations.  Gastrointestinal: Negative for abdominal pain, nausea and vomiting.  Neurological: Negative for dizziness and weakness.    Social History   Tobacco Use  . Smoking status: Current Every Day Smoker    Packs/day: 0.50    Years: 17.00    Pack years: 8.50    Types: Cigarettes  . Smokeless tobacco: Never Used  Substance Use Topics  . Alcohol use: No   Objective:   BP 104/60 (BP Location: Right Arm, Patient Position: Sitting, Cuff Size: Large)   Pulse (!) 103   Temp 98.2 F (36.8 C) (Oral)   Resp 16   Wt 190 lb (86.2 kg)   SpO2 97%   BMI 35.32 kg/m  Vitals:   02/23/18 1020  BP: 104/60  Pulse: (!) 103  Resp: 16  Temp: 98.2 F (36.8 C)  TempSrc: Oral  SpO2: 97%  Weight: 190 lb (86.2 kg)     Physical Exam  General Appearance:    Alert, cooperative, no distress  HENT:   neck without nodes, sinuses nontender and nasal mucosa pale and congested  Eyes:    PERRL, conjunctiva/corneas clear, EOM's intact       Lungs:     Clear to auscultation bilaterally, respirations unlabored  Heart:    Regular rate and rhythm  Neurologic:   Awake, alert, oriented x 3. No apparent focal neurological           defect.           Assessment & Plan:     1. Hoarseness of voice Discussed Ddx including GERD, post nasal drainage, and vocal cord damage secondary to smoking. Will add- montelukast (SINGULAIR) 10 MG tablet; Take 1 tablet (10 mg total) by mouth at bedtime.  Dispense: 30 tablet; Refill: 3 And treat GERD as below.   She requests to go ahead and initiate ENT referral since she also had chronic nasal congestion  which she thinks is secondary to nasal polyps. Advised she will likely need laryngoscopy if hoarseness does not resolve by the time she sees ENT.   2. Gastroesophageal reflux disease, esophagitis presence not specified Partially treated by taking her son's ranitidine. Will change to- omeprazole (PRILOSEC) 40 MG capsule; Take 1 capsule (40 mg total) by mouth daily.  Dispense: 30 capsule; Refill: 3  3. Anxiety state Stable, refill- alprazolam (XANAX) 2 MG tablet; TAKE 1/2 TABLET BY MOUTH FOUR TIMES DAILY AND TAKE ONE TABLET BY MOUTH AT BEDTIME AS NEEDED  Dispense: 90 tablet; Refill: 3  Continue routine follow up with Dr. Kasandra Knudsen for depression.   4. Dyshidrotic eczema Change from steroid cream to lotion. If not effective then she is to call for dermatology referral.  - triamcinolone lotion (KENALOG) 0.1 %; Apply 1 application topically 2 (two) times daily.  Dispense: 60 mL; Refill: 1       Lelon Huh, MD  [facility]

## 2018-03-02 ENCOUNTER — Encounter: Payer: Self-pay | Admitting: Physician Assistant

## 2018-03-02 ENCOUNTER — Ambulatory Visit: Payer: Medicaid Other | Admitting: Physician Assistant

## 2018-03-02 VITALS — BP 108/72 | HR 84 | Temp 98.4°F | Resp 16 | Wt 189.0 lb

## 2018-03-02 DIAGNOSIS — N889 Noninflammatory disorder of cervix uteri, unspecified: Secondary | ICD-10-CM

## 2018-03-02 DIAGNOSIS — N898 Other specified noninflammatory disorders of vagina: Secondary | ICD-10-CM | POA: Diagnosis not present

## 2018-03-02 DIAGNOSIS — R918 Other nonspecific abnormal finding of lung field: Secondary | ICD-10-CM | POA: Diagnosis not present

## 2018-03-02 DIAGNOSIS — Z124 Encounter for screening for malignant neoplasm of cervix: Secondary | ICD-10-CM

## 2018-03-02 NOTE — Patient Instructions (Signed)

## 2018-03-02 NOTE — Progress Notes (Signed)
Patient: Martha Guerra Female    DOB: 04-Sep-1981   37 y.o.   MRN: [id number] Visit Date: 03/03/2018  Today's Provider: Trinna Post, PA-C   Chief Complaint  Patient presents with  . Vaginal Pain   Subjective:    Martha Guerra is a 37 y/o woman with history of pulmonary nodules presenting today with below issue.   Additionally, she is due for a PAP smear. Her last PAP smear was performed in 07/13/2012 and was normal. She reports however some years ago after having a miscarriage, a nurse told her that she could see precancerous cells on her cervix and that this should be followed up.   She also reports having a history of pulmonary nodules. She saw Dr. Vella Kohler in 2016 and was supposed to return for a follow up but never did. She is wondering whether this would be the cause of her hoarse voice over the past several months. Has recently seen Dr. Caryn Section for this and she was referred to ENT and also instructed to take heartburn medications. She would like to go back and see Dr. Vella Kohler.   Vaginal Pain  The patient's primary symptoms include genital lesions and pelvic pain (Chronic issue). The patient's pertinent negatives include no vaginal discharge. This is a new problem. The current episode started in the past 7 days. The problem has been unchanged. Associated symptoms include back pain (Chronic issue). Pertinent negatives include no dysuria, flank pain, frequency, hematuria, joint swelling, nausea, painful intercourse, rash, sore throat, urgency or vomiting. She has tried nothing for the symptoms. The treatment provided mild relief. She is not sexually active. No, her partner does not have an STD.       Allergies  Allergen Reactions  . Tylenol [Acetaminophen]      Current Outpatient Medications:  .  alprazolam (XANAX) 2 MG tablet, TAKE 1/2 TABLET BY MOUTH FOUR TIMES DAILY AND TAKE ONE TABLET BY MOUTH AT BEDTIME AS NEEDED, Disp: 90 tablet, Rfl: 3 .   amphetamine-dextroamphetamine (ADDERALL) 10 MG tablet, Take by mouth., Disp: , Rfl:  .  lamoTRIgine (LAMICTAL) 100 MG tablet, , Disp: , Rfl: 4 .  lidocaine (XYLOCAINE) 2 % jelly, 1 application as needed., Disp: , Rfl:  .  linaclotide (LINZESS) 290 MCG CAPS capsule, Take 1 capsule (290 mcg total) by mouth daily., Disp: 30 capsule, Rfl: 6 .  montelukast (SINGULAIR) 10 MG tablet, Take 1 tablet (10 mg total) by mouth at bedtime., Disp: 30 tablet, Rfl: 3 .  omeprazole (PRILOSEC) 40 MG capsule, Take 1 capsule (40 mg total) by mouth daily., Disp: 30 capsule, Rfl: 3 .  oxycodone (OXY-IR) 5 MG capsule, Take 5 mg by mouth 4 (four) times daily., Disp: , Rfl:  .  pentosan polysulfate (ELMIRON) 100 MG capsule, Take 100 mg by mouth 2 (two) times daily., Disp: , Rfl:  .  tretinoin (RETIN-A) 0.05 % cream, Apply topically at bedtime., Disp: 45 g, Rfl: 1 .  triamcinolone lotion (KENALOG) 0.1 %, Apply 1 application topically 2 (two) times daily., Disp: 60 mL, Rfl: 1 .  TRINTELLIX 20 MG TABS, , Disp: , Rfl: 4  Review of Systems  Constitutional: Negative.   HENT: Negative for sore throat.   Gastrointestinal: Negative for nausea and vomiting.  Genitourinary: Positive for pelvic pain (Chronic issue) and vaginal pain. Negative for decreased urine volume, difficulty urinating, dyspareunia, dysuria, enuresis, flank pain, frequency, genital sores, hematuria, menstrual problem, urgency, vaginal bleeding and vaginal discharge.  Musculoskeletal:  Positive for back pain (Chronic issue).  Skin: Negative for rash.    Social History   Tobacco Use  . Smoking status: Former Smoker    Packs/day: 0.50    Years: 17.00    Pack years: 8.50    Types: Cigarettes  . Smokeless tobacco: Never Used  . Tobacco comment: started smoking age 64, 1/2-1 ppd, quit smoking 01/2018  Substance Use Topics  . Alcohol use: No   Objective:   BP 108/72 (BP Location: Right Arm, Patient Position: Sitting, Cuff Size: Large)   Pulse 84   Temp  98.4 F (36.9 C) (Oral)   Resp 16   Wt 189 lb (85.7 kg)   LMP 02/13/2018   BMI 35.13 kg/m  Vitals:   03/02/18 1415  BP: 108/72  Pulse: 84  Resp: 16  Temp: 98.4 F (36.9 C)  TempSrc: Oral  Weight: 189 lb (85.7 kg)     Physical Exam  Constitutional: She is oriented to person, place, and time. She appears well-developed and well-nourished.  Cardiovascular: Normal rate and regular rhythm.  Pulmonary/Chest: Effort normal and breath sounds normal.  Genitourinary: Cervix exhibits no motion tenderness. No erythema, tenderness or bleeding in the vagina. No foreign body in the vagina. No signs of injury around the vagina. No vaginal discharge found.  Genitourinary Comments: There is a cystic lesion on the inferior portion of the right labia minora that is tender and mildly fluctuant to palpation. The lesion does not appear herpetic and does not express any fluid. On internal exam, there appears to be a lesion on her cervix.   Neurological: She is alert and oriented to person, place, and time.  Skin: Skin is warm and dry.  Psychiatric: Her behavior is normal.        Assessment & Plan:     1. Lesion of cervix  Cervix appears abnormal on exam today. I am not sure how a provider was able to grossly visualize precancerous cells on previous exam, but she does appear to have lesion today and I have informed her gynecology should further evaluate. Will send PAP smear.   - Ambulatory referral to Obstetrics / Gynecology  2. Screening for cervical cancer  - Ambulatory referral to Obstetrics / Gynecology - Pap IG and HPV (high risk) DNA detection  3. Vaginal lesion  Cystic lesion, does not appear infected. Sitz baths 3-4 times per day. Does not appear herpetic but swabbed to r/o.  - Ambulatory referral to Obstetrics / Gynecology - Herpes simplex virus culture  4. Pulmonary nodules  This is a longstanding issue where patient was lost to follow up. I had placed a referral for this in  06/2017 and an appointment was scheduled on 07/20/2017 however the patient says she did not know about this. She says this clinic probably had the wrong phone number and that's why that occurred.  I will re-refer.  - Ambulatory referral to Pulmonology  Return if symptoms worsen or fail to improve.  The entirety of the information documented in the History of Present Illness, Review of Systems and Physical Exam were personally obtained by me. Portions of this information were initially documented by Ashley Royalty, CMA and reviewed by me for thoroughness and accuracy.   I have spent 25 minutes with this patient, >50% of which was spent on counseling and coordination of care.      Trinna Post, PA-C  [facility]

## 2018-03-05 LAB — HERPES SIMPLEX VIRUS CULTURE

## 2018-03-07 LAB — PAP IG AND HPV HIGH-RISK
HPV, high-risk: NEGATIVE
PAP Smear Comment: 0

## 2018-03-08 ENCOUNTER — Telehealth: Payer: Self-pay

## 2018-03-08 NOTE — Telephone Encounter (Signed)
Pt returned call ° °teri °

## 2018-03-08 NOTE — Telephone Encounter (Signed)
-----   Message from Trinna Post, [place] sent at 03/08/2018  9:11 AM EDT ----- Her PAP is normal and her HPV is negative, but I would like OBGYN to evaluate her and just make sure everything is OK.

## 2018-03-08 NOTE — Telephone Encounter (Signed)
LMTCB 03/08/2018  Thanks,   -Mickel Baas

## 2018-03-08 NOTE — Telephone Encounter (Signed)
-----   Message from Trinna Post, [place] sent at 03/05/2018  9:07 AM EDT ----- No herpes simplex virus isolated from lesion.

## 2018-03-09 ENCOUNTER — Encounter: Payer: Self-pay | Admitting: Obstetrics and Gynecology

## 2018-03-09 ENCOUNTER — Ambulatory Visit: Payer: Medicaid Other | Admitting: Obstetrics and Gynecology

## 2018-03-09 VITALS — BP 106/72 | HR 92 | Ht 64.0 in | Wt 192.1 lb

## 2018-03-09 DIAGNOSIS — Z8742 Personal history of other diseases of the female genital tract: Secondary | ICD-10-CM | POA: Diagnosis not present

## 2018-03-09 DIAGNOSIS — N941 Unspecified dyspareunia: Secondary | ICD-10-CM | POA: Diagnosis not present

## 2018-03-09 DIAGNOSIS — N888 Other specified noninflammatory disorders of cervix uteri: Secondary | ICD-10-CM

## 2018-03-09 DIAGNOSIS — N814 Uterovaginal prolapse, unspecified: Secondary | ICD-10-CM

## 2018-03-09 DIAGNOSIS — N854 Malposition of uterus: Secondary | ICD-10-CM | POA: Diagnosis not present

## 2018-03-09 NOTE — Telephone Encounter (Signed)
Pt advised.   Thanks,   -Zailen Albarran  

## 2018-03-09 NOTE — Progress Notes (Signed)
HPI:      Martha Guerra is a 37 y.o. G2P1011 who LMP was Patient's last menstrual period was 02/07/2018.  Subjective:   She presents today sent because of a concern for possible lesion on the cervix and vagina. A significant note the patient's most recent Pap smear was normal with negative HPV.  She complains of pelvic pressure symptoms and dyspareunia. She also states that she has noticed something possibly bulging at the opening of the vagina and is concerned. Reports occasional urine leakage but says that this is a constant for her because of her interstitial cystitis. Reports a remote history of endometriosis treated surgically which significantly reduced her pain. Recent HSV negative.    Hx: The following portions of the patient's history were reviewed and updated as appropriate:             She  has a past medical history of Anxiety, Asthma, Chronic constipation, Cystitides, interstitial, chronic, Depression, Gastritis, GERD (gastroesophageal reflux disease), History of kidney stones, and History of ovarian cyst. She does not have any pertinent problems on file. She  has a past surgical history that includes cystectomy and Ovarian cyst surgery. Her family history includes Heart attack in her mother; Hypertension in her father. She  reports that she has quit smoking. Her smoking use included cigarettes. She has a 8.50 pack-year smoking history. She has never used smokeless tobacco. She reports that she does not drink alcohol or use drugs. She has a current medication list which includes the following prescription(s): alprazolam, amphetamine-dextroamphetamine, lamotrigine, lidocaine, linaclotide, montelukast, omeprazole, oxycodone, pentosan polysulfate, tretinoin, triamcinolone lotion, and trintellix. She is allergic to tylenol [acetaminophen].       Review of Systems:  Review of Systems  Constitutional: Denied constitutional symptoms, night sweats, recent illness, fatigue,  fever, insomnia and weight loss.  Eyes: Denied eye symptoms, eye pain, photophobia, vision change and visual disturbance.  Ears/Nose/Throat/Neck: Denied ear, nose, throat or neck symptoms, hearing loss, nasal discharge, sinus congestion and sore throat.  Cardiovascular: Denied cardiovascular symptoms, arrhythmia, chest pain/pressure, edema, exercise intolerance, orthopnea and palpitations.  Respiratory: Denied pulmonary symptoms, asthma, pleuritic pain, productive sputum, cough, dyspnea and wheezing.  Gastrointestinal: Denied, gastro-esophageal reflux, melena, nausea and vomiting.  Genitourinary: See HPI for additional information.  Musculoskeletal: Denied musculoskeletal symptoms, stiffness, swelling, muscle weakness and myalgia.  Dermatologic: Denied dermatology symptoms, rash and scar.  Neurologic: Denied neurology symptoms, dizziness, headache, neck pain and syncope.  Psychiatric: Denied psychiatric symptoms, anxiety and depression.  Endocrine: Denied endocrine symptoms including hot flashes and night sweats.   Meds:   Current Outpatient Medications on File Prior to Visit  Medication Sig Dispense Refill  . alprazolam (XANAX) 2 MG tablet TAKE 1/2 TABLET BY MOUTH FOUR TIMES DAILY AND TAKE ONE TABLET BY MOUTH AT BEDTIME AS NEEDED 90 tablet 3  . amphetamine-dextroamphetamine (ADDERALL) 10 MG tablet Take by mouth.    . lamoTRIgine (LAMICTAL) 100 MG tablet   4  . lidocaine (XYLOCAINE) 2 % jelly 1 application as needed.    . linaclotide (LINZESS) 290 MCG CAPS capsule Take 1 capsule (290 mcg total) by mouth daily. 30 capsule 6  . montelukast (SINGULAIR) 10 MG tablet Take 1 tablet (10 mg total) by mouth at bedtime. 30 tablet 3  . omeprazole (PRILOSEC) 40 MG capsule Take 1 capsule (40 mg total) by mouth daily. 30 capsule 3  . oxycodone (OXY-IR) 5 MG capsule Take 5 mg by mouth 4 (four) times daily.    . pentosan polysulfate (ELMIRON)  100 MG capsule Take 100 mg by mouth 2 (two) times daily.    Marland Kitchen  tretinoin (RETIN-A) 0.05 % cream Apply topically at bedtime. 45 g 1  . triamcinolone lotion (KENALOG) 0.1 % Apply 1 application topically 2 (two) times daily. 60 mL 1  . TRINTELLIX 20 MG TABS   4   No current facility-administered medications on file prior to visit.     Objective:     Vitals:   03/09/18 1441  BP: 106/72  Pulse: 92              Physical examination   Pelvic:   Vulva: Normal appearance.  No lesions.  Vagina: No lesions or abnormalities noted.  Support:  Second-degree cystocele with some uterine descensus  Urethra No masses tenderness or scarring.  Meatus Normal size without lesions or prolapse.  Cervix:  Friable but no ulcerated lesions noted.  Several small nabothian cysts present  Anus: Normal exam.  No lesions.  Perineum: Normal exam.  No lesions.        Bimanual   Uterus: Normal size.  Tender to palpation.  Mobile.  Retroverted  Adnexae: No masses.  Non-tender to palpation.  Cul-de-sac: Negative for abnormality.     Assessment:    G2P1011 Patient Active Problem List   Diagnosis Date Noted  . Urinary tract infection 02/26/2016  . Cannot sleep 07/15/2015  . H/O disease 07/15/2015  . H/O urinary stone 07/15/2015  . Gastric catarrh 07/15/2015  . Anxiety state 08/07/2014  . Chronic interstitial cystitis 08/07/2014  . CAFL (chronic airflow limitation) ([place]) 08/07/2014  . GERD (gastroesophageal reflux disease) 08/07/2014  . Compulsive tobacco user syndrome 08/07/2014  . Depression, major, single episode 08/07/2014  . Palpitations 06/13/2013     1. Nabothian follicles on cervix   2. Female dyspareunia   3. Personal history of endometriosis   4. Retroverted uterus   5. Cystocele with uterine descensus     Can find no evidence of ulcerative lesions at this time.  I have reassured by her normal Pap negative HSV and negative HPV. I believe it is likely her pelvic pain and pain with intercourse may be secondary to a recurrence of her  endometriosis. Her pelvic pressure symptoms also may be because of some mild to moderate pelvic prolapse and indeed this is likely which she sees at the entrance to her vagina.   Plan:            1.  I have reassured her regarding her Pap smear and cervical cancer.  2.  No further work-up at this time.  3.  I think it would be reasonable to consider treatment for endometriosis- OCPs or IUD.  Patient will consider these options and inform us if she would like to try something.  4.  Her prolapse is mild and not generally causing issues-no treatment at this time.  I do not believe surgery is warranted yet. Orders No orders of the defined types were placed in this encounter.   No orders of the defined types were placed in this encounter.     F/U  Return for Pt to contact us if symptoms worsen. I spent 37 minutes involved in the care of this patient of which greater than 50% was spent discussing all of the above issues including endometriosis cervical cancer Pap smears pain with intercourse ulcerative lesions of the vagina and cervix nabothian cysts pelvic prolapse interstitial cystitis etc.  Finis Bud, M.D. 03/09/2018 4:05 PM

## 2018-03-09 NOTE — Progress Notes (Signed)
Pt is present today due to a lesion on cervix. Pt stated that she has sharp pain and aching in the vaginal area.

## 2018-04-08 DIAGNOSIS — K219 Gastro-esophageal reflux disease without esophagitis: Secondary | ICD-10-CM | POA: Diagnosis not present

## 2018-04-08 DIAGNOSIS — J358 Other chronic diseases of tonsils and adenoids: Secondary | ICD-10-CM | POA: Diagnosis not present

## 2018-04-08 DIAGNOSIS — J301 Allergic rhinitis due to pollen: Secondary | ICD-10-CM | POA: Diagnosis not present

## 2018-04-28 ENCOUNTER — Telehealth: Payer: Self-pay | Admitting: Physician Assistant

## 2018-04-28 NOTE — Telephone Encounter (Signed)
Pt was referred to [facility] pulmonology for pulmonary nodules.Their office and myself have made several attempts to contact pt without return phone call

## 2018-04-28 NOTE — Telephone Encounter (Signed)
Noted, thanks!

## 2018-05-04 DIAGNOSIS — M542 Cervicalgia: Secondary | ICD-10-CM | POA: Diagnosis not present

## 2018-05-04 DIAGNOSIS — Z3202 Encounter for pregnancy test, result negative: Secondary | ICD-10-CM | POA: Diagnosis not present

## 2018-05-04 DIAGNOSIS — M25551 Pain in right hip: Secondary | ICD-10-CM | POA: Diagnosis not present

## 2018-05-04 DIAGNOSIS — G8929 Other chronic pain: Secondary | ICD-10-CM | POA: Diagnosis not present

## 2018-05-04 DIAGNOSIS — Z79891 Long term (current) use of opiate analgesic: Secondary | ICD-10-CM | POA: Diagnosis not present

## 2018-05-04 DIAGNOSIS — M47812 Spondylosis without myelopathy or radiculopathy, cervical region: Secondary | ICD-10-CM | POA: Diagnosis not present

## 2018-06-01 DIAGNOSIS — G8929 Other chronic pain: Secondary | ICD-10-CM | POA: Diagnosis not present

## 2018-06-01 DIAGNOSIS — M25551 Pain in right hip: Secondary | ICD-10-CM | POA: Diagnosis not present

## 2018-06-01 DIAGNOSIS — Z3202 Encounter for pregnancy test, result negative: Secondary | ICD-10-CM | POA: Diagnosis not present

## 2018-06-01 DIAGNOSIS — Z79891 Long term (current) use of opiate analgesic: Secondary | ICD-10-CM | POA: Diagnosis not present

## 2018-06-01 DIAGNOSIS — M47812 Spondylosis without myelopathy or radiculopathy, cervical region: Secondary | ICD-10-CM | POA: Diagnosis not present

## 2018-06-01 DIAGNOSIS — M542 Cervicalgia: Secondary | ICD-10-CM | POA: Diagnosis not present

## 2018-06-16 DIAGNOSIS — N8110 Cystocele, unspecified: Secondary | ICD-10-CM | POA: Diagnosis not present

## 2018-06-16 DIAGNOSIS — N3010 Interstitial cystitis (chronic) without hematuria: Secondary | ICD-10-CM | POA: Diagnosis not present

## 2018-06-23 ENCOUNTER — Other Ambulatory Visit: Payer: Self-pay | Admitting: Family Medicine

## 2018-06-23 DIAGNOSIS — F4010 Social phobia, unspecified: Secondary | ICD-10-CM | POA: Diagnosis not present

## 2018-06-23 DIAGNOSIS — F3189 Other bipolar disorder: Secondary | ICD-10-CM | POA: Diagnosis not present

## 2018-06-23 DIAGNOSIS — F411 Generalized anxiety disorder: Secondary | ICD-10-CM

## 2018-06-23 DIAGNOSIS — F410 Panic disorder [episodic paroxysmal anxiety] without agoraphobia: Secondary | ICD-10-CM | POA: Diagnosis not present

## 2018-06-23 DIAGNOSIS — F900 Attention-deficit hyperactivity disorder, predominantly inattentive type: Secondary | ICD-10-CM | POA: Diagnosis not present

## 2018-06-23 MED ORDER — ALPRAZOLAM 2 MG PO TABS: ORAL_TABLET | ORAL | 3 refills | Status: DC

## 2018-06-23 NOTE — Telephone Encounter (Signed)
Patient needs refill on Alprazolam 2 mg. Sent to [facility] in [place]

## 2018-06-30 DIAGNOSIS — G8929 Other chronic pain: Secondary | ICD-10-CM | POA: Diagnosis not present

## 2018-06-30 DIAGNOSIS — M25551 Pain in right hip: Secondary | ICD-10-CM | POA: Diagnosis not present

## 2018-06-30 DIAGNOSIS — M542 Cervicalgia: Secondary | ICD-10-CM | POA: Diagnosis not present

## 2018-06-30 DIAGNOSIS — Z3202 Encounter for pregnancy test, result negative: Secondary | ICD-10-CM | POA: Diagnosis not present

## 2018-06-30 DIAGNOSIS — M47812 Spondylosis without myelopathy or radiculopathy, cervical region: Secondary | ICD-10-CM | POA: Diagnosis not present

## 2018-06-30 DIAGNOSIS — Z79891 Long term (current) use of opiate analgesic: Secondary | ICD-10-CM | POA: Diagnosis not present

## 2018-08-01 DIAGNOSIS — M47812 Spondylosis without myelopathy or radiculopathy, cervical region: Secondary | ICD-10-CM | POA: Diagnosis not present

## 2018-08-01 DIAGNOSIS — M542 Cervicalgia: Secondary | ICD-10-CM | POA: Diagnosis not present

## 2018-08-01 DIAGNOSIS — Z3202 Encounter for pregnancy test, result negative: Secondary | ICD-10-CM | POA: Diagnosis not present

## 2018-08-01 DIAGNOSIS — Z79891 Long term (current) use of opiate analgesic: Secondary | ICD-10-CM | POA: Diagnosis not present

## 2018-08-01 DIAGNOSIS — G8929 Other chronic pain: Secondary | ICD-10-CM | POA: Diagnosis not present

## 2018-08-30 ENCOUNTER — Ambulatory Visit: Payer: Medicaid Other | Admitting: Family Medicine

## 2018-08-30 ENCOUNTER — Encounter: Payer: Self-pay | Admitting: Family Medicine

## 2018-08-30 VITALS — BP 92/62 | HR 96 | Temp 98.7°F | Resp 16 | Wt 187.0 lb

## 2018-08-30 DIAGNOSIS — G8929 Other chronic pain: Secondary | ICD-10-CM | POA: Diagnosis not present

## 2018-08-30 DIAGNOSIS — R05 Cough: Secondary | ICD-10-CM | POA: Diagnosis not present

## 2018-08-30 DIAGNOSIS — M542 Cervicalgia: Secondary | ICD-10-CM | POA: Diagnosis not present

## 2018-08-30 DIAGNOSIS — M5126 Other intervertebral disc displacement, lumbar region: Secondary | ICD-10-CM | POA: Diagnosis not present

## 2018-08-30 DIAGNOSIS — N3010 Interstitial cystitis (chronic) without hematuria: Secondary | ICD-10-CM

## 2018-08-30 DIAGNOSIS — R059 Cough, unspecified: Secondary | ICD-10-CM

## 2018-08-30 DIAGNOSIS — M549 Dorsalgia, unspecified: Secondary | ICD-10-CM

## 2018-08-30 DIAGNOSIS — M545 Low back pain: Secondary | ICD-10-CM | POA: Diagnosis not present

## 2018-08-30 NOTE — Progress Notes (Signed)
Patient: Martha Guerra Female    DOB: 1981-05-30   37 y.o.   MRN: [id number] Visit Date: 08/30/2018  Today's Provider: Lelon Huh, MD   Chief Complaint  Patient presents with  . Follow-up   Subjective:    HPI Pain management: Patient comes in requesting a referral to a different pain management clinic. She has been followed at [facility] in [facility] for several conditions until she received notice this week this it was closed and would she need to follow up with PCP for referral to another pain clinic. She was originally referred at the recommendation of her urologist Alona Bene for pain related to interstitial cystitis. She has also had back and neck pains secondary to disk herniations. She was evaluated for this at [place] a few years ago, but states has since been spine injections done at [facility] and they have been effective. She states he current medication regiment has been effective at controlling pain from IC and has been stable for the last year.     Allergies  Allergen Reactions  . Tylenol [Acetaminophen]      Current Outpatient Medications:  .  alprazolam (XANAX) 2 MG tablet, TAKE 1/2 TABLET BY MOUTH FOUR TIMES DAILY AND TAKE ONE TABLET BY MOUTH AT BEDTIME AS NEEDED, Disp: 90 tablet, Rfl: 3 .  amphetamine-dextroamphetamine (ADDERALL) 10 MG tablet, Take by mouth., Disp: , Rfl:  .  lamoTRIgine (LAMICTAL) 100 MG tablet, , Disp: , Rfl: 4 .  lidocaine (XYLOCAINE) 2 % jelly, 1 application as needed., Disp: , Rfl:  .  linaclotide (LINZESS) 290 MCG CAPS capsule, Take 1 capsule (290 mcg total) by mouth daily., Disp: 30 capsule, Rfl: 6 .  montelukast (SINGULAIR) 10 MG tablet, Take 1 tablet (10 mg total) by mouth at bedtime., Disp: 30 tablet, Rfl: 3 .  omeprazole (PRILOSEC) 40 MG capsule, Take 1 capsule (40 mg total) by mouth daily., Disp: 30 capsule, Rfl: 3 .  oxyCODONE (ROXICODONE) 15 MG immediate release tablet, Take 1 tablet by  mouth 4 (four) times daily., Disp: , Rfl: 0 .  pentosan polysulfate (ELMIRON) 100 MG capsule, Take 100 mg by mouth 2 (two) times daily., Disp: , Rfl:  .  tretinoin (RETIN-A) 0.05 % cream, Apply topically at bedtime., Disp: 45 g, Rfl: 1 .  triamcinolone lotion (KENALOG) 0.1 %, Apply 1 application topically 2 (two) times daily., Disp: 60 mL, Rfl: 1 .  TRINTELLIX 20 MG TABS, , Disp: , Rfl: 4  Review of Systems  Constitutional: Negative for appetite change, chills, fatigue and fever.  Respiratory: Negative for chest tightness and shortness of breath.   Cardiovascular: Negative for chest pain and palpitations.  Gastrointestinal: Negative for abdominal pain, nausea and vomiting.  Neurological: Negative for dizziness and weakness.    Social History   Tobacco Use  . Smoking status: Former Smoker    Packs/day: 0.50    Years: 17.00    Pack years: 8.50    Types: Cigarettes  . Smokeless tobacco: Never Used  . Tobacco comment: started smoking age 73, 1/2-1 ppd, quit smoking 01/2018  Substance Use Topics  . Alcohol use: No   Objective:   BP 92/62 (BP Location: Left Arm, Patient Position: Sitting, Cuff Size: Large)   Pulse 96   Temp 98.7 F (37.1 C) (Oral)   Resp 16   Wt 187 lb (84.8 kg)   BMI 32.10 kg/m  Vitals:   08/30/18 1404  BP: 92/62  Pulse: 96  Resp: 16  Temp: 98.7 F (37.1 C)  TempSrc: Oral  Weight: 187 lb (84.8 kg)     Physical Exam  General Appearance:    Alert, cooperative, no distress  HENT:   ENT exam normal, no neck nodes or sinus tenderness, neck without nodes, post nasal drip noted and nasal mucosa congested  Eyes:    PERRL, conjunctiva/corneas clear, EOM's intact       Lungs:     Clear to auscultation bilaterally, respirations unlabored  Heart:    Regular rate and rhythm  Neurologic:   Awake, alert, oriented x 3. No apparent focal neurological           defect.           Assessment & Plan:     1. Chronic interstitial cystitis Followed by Dr Amalia Hailey and  pain previously managed at [facility] which is now closed.  - Ambulatory referral to Pain Clinic  2. Cough Mild. Likely viral URI. Call if no improving over the next several days.   3. Chronic bilateral low back pain without sciatica   4. Neck pain   5. Lumbar herniated disc  - Ambulatory referral to Pain Clinic       Lelon Huh, MD  [facility]

## 2018-08-30 NOTE — Patient Instructions (Signed)
Try OTC chlorpheniramine (Clrotrimeton) and Delsym at night for cough

## 2018-09-28 DIAGNOSIS — Z79891 Long term (current) use of opiate analgesic: Secondary | ICD-10-CM | POA: Diagnosis not present

## 2018-09-28 DIAGNOSIS — M542 Cervicalgia: Secondary | ICD-10-CM | POA: Diagnosis not present

## 2018-09-28 DIAGNOSIS — Z3202 Encounter for pregnancy test, result negative: Secondary | ICD-10-CM | POA: Diagnosis not present

## 2018-09-28 DIAGNOSIS — G8929 Other chronic pain: Secondary | ICD-10-CM | POA: Diagnosis not present

## 2018-09-28 DIAGNOSIS — M47812 Spondylosis without myelopathy or radiculopathy, cervical region: Secondary | ICD-10-CM | POA: Diagnosis not present

## 2018-10-17 ENCOUNTER — Other Ambulatory Visit: Payer: Self-pay | Admitting: Family Medicine

## 2018-10-17 DIAGNOSIS — F411 Generalized anxiety disorder: Secondary | ICD-10-CM

## 2018-10-17 MED ORDER — ALPRAZOLAM 2 MG PO TABS: ORAL_TABLET | ORAL | 3 refills | Status: DC

## 2018-10-17 NOTE — Telephone Encounter (Signed)
Pt needing a refill on: alprazolam Duanne Moron) 2 MG tablet  Please fill at: [id number] - [address], [place] [phone] (Phone) [phone] (Fax)    Thanks, [employer]

## 2018-10-26 DIAGNOSIS — Z3202 Encounter for pregnancy test, result negative: Secondary | ICD-10-CM | POA: Diagnosis not present

## 2018-10-26 DIAGNOSIS — Z79891 Long term (current) use of opiate analgesic: Secondary | ICD-10-CM | POA: Diagnosis not present

## 2018-10-26 DIAGNOSIS — M545 Low back pain: Secondary | ICD-10-CM | POA: Diagnosis not present

## 2018-10-26 DIAGNOSIS — G8929 Other chronic pain: Secondary | ICD-10-CM | POA: Diagnosis not present

## 2018-10-26 DIAGNOSIS — M542 Cervicalgia: Secondary | ICD-10-CM | POA: Diagnosis not present

## 2018-10-26 DIAGNOSIS — M47812 Spondylosis without myelopathy or radiculopathy, cervical region: Secondary | ICD-10-CM | POA: Diagnosis not present

## 2018-11-17 DIAGNOSIS — F4310 Post-traumatic stress disorder, unspecified: Secondary | ICD-10-CM | POA: Diagnosis not present

## 2018-11-17 DIAGNOSIS — F3189 Other bipolar disorder: Secondary | ICD-10-CM | POA: Diagnosis not present

## 2018-11-17 DIAGNOSIS — F900 Attention-deficit hyperactivity disorder, predominantly inattentive type: Secondary | ICD-10-CM | POA: Diagnosis not present

## 2018-11-17 DIAGNOSIS — Z79899 Other long term (current) drug therapy: Secondary | ICD-10-CM | POA: Diagnosis not present

## 2018-11-17 DIAGNOSIS — F410 Panic disorder [episodic paroxysmal anxiety] without agoraphobia: Secondary | ICD-10-CM | POA: Diagnosis not present

## 2018-11-23 DIAGNOSIS — G8929 Other chronic pain: Secondary | ICD-10-CM | POA: Diagnosis not present

## 2018-11-30 ENCOUNTER — Encounter: Payer: Self-pay | Admitting: Family Medicine

## 2018-11-30 ENCOUNTER — Ambulatory Visit: Payer: Medicaid Other | Admitting: Family Medicine

## 2018-11-30 VITALS — BP 108/62 | HR 89 | Temp 98.0°F | Resp 16 | Wt 181.0 lb

## 2018-11-30 DIAGNOSIS — L659 Nonscarring hair loss, unspecified: Secondary | ICD-10-CM

## 2018-11-30 DIAGNOSIS — Z79899 Other long term (current) drug therapy: Secondary | ICD-10-CM

## 2018-11-30 DIAGNOSIS — M545 Low back pain: Secondary | ICD-10-CM | POA: Diagnosis not present

## 2018-11-30 DIAGNOSIS — N3010 Interstitial cystitis (chronic) without hematuria: Secondary | ICD-10-CM | POA: Diagnosis not present

## 2018-11-30 DIAGNOSIS — G8929 Other chronic pain: Secondary | ICD-10-CM | POA: Diagnosis not present

## 2018-11-30 DIAGNOSIS — M5126 Other intervertebral disc displacement, lumbar region: Secondary | ICD-10-CM

## 2018-11-30 NOTE — Progress Notes (Signed)
Patient: Martha Guerra Female    DOB: [birth year]   38 y.o.   MRN: [id number] Visit Date: 11/30/2018  Today's Provider: Lelon Huh, MD   Chief Complaint  Patient presents with  . Follow-up   Subjective:     HPI  Follow up Evaluation: Patient comes in today requesting a referral to a new pain clinic. She was previously followed at [facility] for chronic pain secondary to interstitial cystitis and back and neck pain due to disk herniations. She requests referral to local pain clinic since the practice in [facility] has shut down  She also reports that she is losing her hair and request evaluation or referral to have this treated. It has been going on for several months and seems to be getting worse.    Allergies  Allergen Reactions  . Tylenol [Acetaminophen]      Current Outpatient Medications:  .  alprazolam (XANAX) 2 MG tablet, TAKE 1/2 TABLET BY MOUTH FOUR TIMES DAILY AND TAKE ONE TABLET BY MOUTH AT BEDTIME AS NEEDED, Disp: 90 tablet, Rfl: 3 .  amphetamine-dextroamphetamine (ADDERALL) 10 MG tablet, Take by mouth., Disp: , Rfl:  .  lamoTRIgine (LAMICTAL) 100 MG tablet, , Disp: , Rfl: 4 .  lidocaine (XYLOCAINE) 2 % jelly, 1 application as needed., Disp: , Rfl:  .  linaclotide (LINZESS) 290 MCG CAPS capsule, Take 1 capsule (290 mcg total) by mouth daily., Disp: 30 capsule, Rfl: 6 .  omeprazole (PRILOSEC) 40 MG capsule, Take 1 capsule (40 mg total) by mouth daily., Disp: 30 capsule, Rfl: 3 .  oxyCODONE (ROXICODONE) 15 MG immediate release tablet, Take 1 tablet by mouth 4 (four) times daily., Disp: , Rfl: 0 .  pentosan polysulfate (ELMIRON) 100 MG capsule, Take 100 mg by mouth 2 (two) times daily., Disp: , Rfl:  .  tretinoin (RETIN-A) 0.05 % cream, Apply topically at bedtime., Disp: 45 g, Rfl: 1 .  triamcinolone lotion (KENALOG) 0.1 %, Apply 1 application topically 2 (two) times daily., Disp: 60 mL, Rfl: 1 .  TRINTELLIX 20 MG TABS, , Disp: , Rfl: 4 .   montelukast (SINGULAIR) 10 MG tablet, Take 1 tablet (10 mg total) by mouth at bedtime. (Patient not taking: Reported on 11/30/2018), Disp: 30 tablet, Rfl: 3  Review of Systems  Constitutional: Negative for appetite change, chills, fatigue and fever.  Respiratory: Negative for chest tightness and shortness of breath.   Cardiovascular: Negative for chest pain and palpitations.  Gastrointestinal: Negative for abdominal pain, nausea and vomiting.  Musculoskeletal: Positive for arthralgias.  Skin:       Hair loss  Neurological: Negative for dizziness and weakness.    Social History   Tobacco Use  . Smoking status: Current Every Day Smoker    Packs/day: 0.50    Years: 17.00    Pack years: 8.50    Types: Cigarettes  . Smokeless tobacco: Never Used  . Tobacco comment: started smoking age 35, 1/2-1 ppd, quit smoking 01/2018  Substance Use Topics  . Alcohol use: No      Objective:   BP 108/62 (BP Location: Left Arm, Patient Position: Sitting, Cuff Size: Large)   Pulse 89   Temp 98 F (36.7 C) (Oral)   Resp 16   Wt 181 lb (82.1 kg)   SpO2 98% Comment: room air  BMI 31.07 kg/m  Vitals:   11/30/18 1633  BP: 108/62  Pulse: 89  Resp: 16  Temp: 98 F (36.7 C)  TempSrc: Oral  SpO2: 98%  Weight: 181 lb (82.1 kg)     Physical Exam  General appearance: alert, well developed, well nourished, cooperative and in no distress Head: Normocephalic, without obvious abnormality, atraumatic Respiratory: Respirations even and unlabored, normal respiratory rate Extremities: No gross deformities Skin: several areas of thinning hear across scalp.     Assessment & Plan    1. Chronic interstitial cystitis  - Ambulatory referral to Pain Clinic  2. Chronic bilateral low back pain without sciatica  - Ambulatory referral to Pain Clinic  3. Lumbar herniated disc  - Ambulatory referral to Pain Clinic  4. Alopecia Anticipated referral to dermatology if labs are normal.  - CBC with  Differential/Platelet - TSH  5. Long-term use of high-risk medication  - Comprehensive metabolic panel     Lelon Huh, MD  [facility]

## 2018-11-30 NOTE — Patient Instructions (Signed)
.   Please review the attached list of medications and notify my office if there are any errors.   . Please bring all of your medications to every appointment so we can make sure that our medication list is the same as yours.   

## 2018-12-02 DIAGNOSIS — Z79899 Other long term (current) drug therapy: Secondary | ICD-10-CM | POA: Diagnosis not present

## 2018-12-03 LAB — COMPREHENSIVE METABOLIC PANEL
ALT: 27 IU/L (ref 0–32)
AST: 19 IU/L (ref 0–40)
Albumin/Globulin Ratio: 1.7 (ref 1.2–2.2)
Albumin: 4 g/dL (ref 3.8–4.8)
Alkaline Phosphatase: 96 IU/L (ref 39–117)
BUN/Creatinine Ratio: 17 (ref 9–23)
BUN: 12 mg/dL (ref 6–20)
Bilirubin Total: <0.2 mg/dL (ref 0.0–1.2)
CO2: 23 mmol/L (ref 20–29)
Calcium: 9.4 mg/dL (ref 8.7–10.2)
Chloride: 99 mmol/L (ref 96–106)
Creatinine, Ser: 0.72 mg/dL (ref 0.57–1.00)
GFR calc Af Amer: 123 mL/min/{1.73_m2} (ref >59)
GFR calc non Af Amer: 107 mL/min/{1.73_m2} (ref >59)
Globulin, Total: 2.4 g/dL (ref 1.5–4.5)
Glucose: 91 mg/dL (ref 65–99)
Potassium: 4.6 mmol/L (ref 3.5–5.2)
Sodium: 136 mmol/L (ref 134–144)
Total Protein: 6.4 g/dL (ref 6.0–8.5)

## 2018-12-03 LAB — CBC WITH DIFFERENTIAL/PLATELET
Basophils Absolute: 0.1 10*3/uL (ref 0.0–0.2)
Basos: 1 %
EOS (ABSOLUTE): 0.2 10*3/uL (ref 0.0–0.4)
Eos: 2 %
Hematocrit: 40.3 % (ref 34.0–46.6)
Hemoglobin: 13.9 g/dL (ref 11.1–15.9)
Immature Grans (Abs): 0 10*3/uL (ref 0.0–0.1)
Immature Granulocytes: 0 %
Lymphocytes Absolute: 2.6 10*3/uL (ref 0.7–3.1)
Lymphs: 30 %
MCH: 30.1 pg (ref 26.6–33.0)
MCHC: 34.5 g/dL (ref 31.5–35.7)
MCV: 87 fL (ref 79–97)
Monocytes Absolute: 0.8 10*3/uL (ref 0.1–0.9)
Monocytes: 9 %
Neutrophils Absolute: 5.2 10*3/uL (ref 1.4–7.0)
Neutrophils: 58 %
Platelets: 337 10*3/uL (ref 150–450)
RBC: 4.62 x10E6/uL (ref 3.77–5.28)
RDW: 13.3 % (ref 11.7–15.4)
WBC: 8.9 10*3/uL (ref 3.4–10.8)

## 2018-12-03 LAB — TSH: TSH: 1.39 u[IU]/mL (ref 0.450–4.500)

## 2018-12-05 ENCOUNTER — Telehealth: Payer: Self-pay

## 2018-12-05 DIAGNOSIS — L659 Nonscarring hair loss, unspecified: Secondary | ICD-10-CM

## 2018-12-05 NOTE — Telephone Encounter (Signed)
Pt advised.  She would like to be referred to Dermatology for hair loss.   Thanks,   -Mickel Baas

## 2018-12-05 NOTE — Telephone Encounter (Signed)
-----   Message from Birdie Sons, MD sent at 12/03/2018  7:41 PM EST ----- Labs normal. Continue current medications.  Check yearly.

## 2018-12-12 ENCOUNTER — Telehealth: Payer: Self-pay | Admitting: Family Medicine

## 2018-12-12 DIAGNOSIS — L659 Nonscarring hair loss, unspecified: Secondary | ICD-10-CM

## 2018-12-12 NOTE — Telephone Encounter (Signed)
Pt wanting a new dermatologist referral sent to: [facility] on [address], [place]  1st referral was to a dermatologist with bad reviews.  Thanks, [employer]

## 2018-12-20 ENCOUNTER — Other Ambulatory Visit: Payer: Self-pay

## 2018-12-20 ENCOUNTER — Ambulatory Visit: Payer: Medicaid Other | Attending: Nurse Practitioner | Admitting: Nurse Practitioner

## 2018-12-20 ENCOUNTER — Encounter: Payer: Self-pay | Admitting: Nurse Practitioner

## 2018-12-20 VITALS — BP 106/85 | HR 125 | Temp 98.1°F | Resp 16 | Ht 65.0 in | Wt 185.0 lb

## 2018-12-20 DIAGNOSIS — Z79891 Long term (current) use of opiate analgesic: Secondary | ICD-10-CM | POA: Diagnosis not present

## 2018-12-20 DIAGNOSIS — M5441 Lumbago with sciatica, right side: Secondary | ICD-10-CM | POA: Diagnosis not present

## 2018-12-20 DIAGNOSIS — M533 Sacrococcygeal disorders, not elsewhere classified: Secondary | ICD-10-CM | POA: Diagnosis not present

## 2018-12-20 DIAGNOSIS — M79604 Pain in right leg: Secondary | ICD-10-CM | POA: Diagnosis not present

## 2018-12-20 DIAGNOSIS — G8929 Other chronic pain: Secondary | ICD-10-CM

## 2018-12-20 DIAGNOSIS — M899 Disorder of bone, unspecified: Secondary | ICD-10-CM | POA: Diagnosis not present

## 2018-12-20 DIAGNOSIS — R102 Pelvic and perineal pain: Secondary | ICD-10-CM | POA: Diagnosis not present

## 2018-12-20 DIAGNOSIS — Z79899 Other long term (current) drug therapy: Secondary | ICD-10-CM | POA: Diagnosis not present

## 2018-12-20 DIAGNOSIS — M5442 Lumbago with sciatica, left side: Secondary | ICD-10-CM

## 2018-12-20 DIAGNOSIS — M79605 Pain in left leg: Secondary | ICD-10-CM | POA: Insufficient documentation

## 2018-12-20 DIAGNOSIS — M542 Cervicalgia: Secondary | ICD-10-CM | POA: Insufficient documentation

## 2018-12-20 DIAGNOSIS — Z789 Other specified health status: Secondary | ICD-10-CM | POA: Diagnosis not present

## 2018-12-20 NOTE — Progress Notes (Signed)
Safety precautions to be maintained throughout the outpatient stay will include: orient to surroundings, keep bed in low position, maintain call bell within reach at all times, provide assistance with transfer out of bed and ambulation.  

## 2018-12-20 NOTE — Patient Instructions (Addendum)
____________________________________________________________________________________________  Appointment Policy Summary  It is our goal and responsibility to provide the medical community with assistance in the evaluation and management of patients with chronic pain. Unfortunately our resources are limited. Because we do not have an unlimited amount of time, or available appointments, we are required to closely monitor and manage their use. The following rules exist to maximize their use:  Patient's responsibilities: 1. Punctuality:  At what time should I arrive? You should be physically present in our office 30 minutes before your scheduled appointment. Your scheduled appointment is with your assigned healthcare provider. However, it takes 5-10 minutes to be "checked-in", and another 15 minutes for the nurses to do the admission. If you arrive to our office at the time you were given for your appointment, you will end up being at least 20-25 minutes late to your appointment with the provider. 2. Tardiness:  What happens if I arrive only a few minutes after my scheduled appointment time? You will need to reschedule your appointment. The cutoff is your appointment time. This is why it is so important that you arrive at least 30 minutes before that appointment. If you have an appointment scheduled for 10:00 AM and you arrive at 10:01, you will be required to reschedule your appointment.  3. Plan ahead:  Always assume that you will encounter traffic on your way in. Plan for it. If you are dependent on a driver, make sure they understand these rules and the need to arrive early. 4. Other appointments and responsibilities:  Avoid scheduling any other appointments before or after your pain clinic appointments.  5. Be prepared:  Write down everything that you need to discuss with your healthcare provider and give this information to the admitting nurse. Write down the medications that you will need  refilled. Bring your pills and bottles (even the empty ones), to all of your appointments, except for those where a procedure is scheduled. 6. No children or pets:  Find someone to take care of them. It is not appropriate to bring them in. 7. Scheduling changes:  We request "advanced notification" of any changes or cancellations. 8. Advanced notification:  Defined as a time period of more than 24 hours prior to the originally scheduled appointment. This allows for the appointment to be offered to other patients. 9. Rescheduling:  When a visit is rescheduled, it will require the cancellation of the original appointment. For this reason they both fall within the category of "Cancellations".  10. Cancellations:  They require advanced notification. Any cancellation less than 24 hours before the  appointment will be recorded as a "No Show". 11. No Show:  Defined as an unkept appointment where the patient failed to notify or declare to the practice their intention or inability to keep the appointment.  Corrective process for repeat offenders:  1. Tardiness: Three (3) episodes of rescheduling due to late arrivals will be recorded as one (1) "No Show". 2. Cancellation or reschedule: Three (3) cancellations or rescheduling will be recorded as one (1) "No Show". 3. "No Shows": Three (3) "No Shows" within a 12 month period will result in discharge from the practice. ____________________________________________________________________________________________   ______________________________________________________________________________________________  Specialty Pain Scale  Introduction:  There are significant differences in how pain is reported. The word pain usually refers to physical pain, but it is also a common synonym of suffering. The medical community uses a scale from 0 (zero) to 10 (ten) to report pain level. Zero (0) is described as "no pain",   while ten (10) is described as "the worse pain  you can imagine". The problem with this scale is that physical pain is reported along with suffering. Suffering refers to mental pain, or more often yet it refers to any unpleasant feeling, emotion or aversion associated with the perception of harm or threat of harm. It is the psychological component of pain.  Pain Specialists prefer to separate the two components. The pain scale used by this practice is the Verbal Numerical Rating Scale (VNRS-11). This scale is for the physical pain only. DO NOT INCLUDE how your pain psychologically affects you. This scale is for adults 21 years of age and older. It has 11 (eleven) levels. The 1st level is 0/10. This means: "right now, I have no pain". In the context of pain management, it also means: "right now, my physical pain is under control with the current therapy".  General Information:  The scale should reflect your current level of pain. Unless you are specifically asked for the level of your worst pain, or your average pain. If you are asked for one of these two, then it should be understood that it is over the past 24 hours.  Levels 1 (one) through 5 (five) are described below, and can be treated as an outpatient. Ambulatory pain management facilities such as ours are more than adequate to treat these levels. Levels 6 (six) through 10 (ten) are also described below, however, these must be treated as a hospitalized patient. While levels 6 (six) and 7 (seven) may be evaluated at an urgent care facility, levels 8 (eight) through 10 (ten) constitute medical emergencies and as such, they belong in a hospital's emergency department. When having these levels (as described below), do not come to our office. Our facility is not equipped to manage these levels. Go directly to an urgent care facility or an emergency department to be evaluated.  Definitions:  Activities of Daily Living (ADL): Activities of daily living (ADL or ADLs) is a term used in healthcare to refer to  people's daily self-care activities. Health professionals often use a person's ability or inability to perform ADLs as a measurement of their functional status, particularly in regard to people post injury, with disabilities and the elderly. There are two ADL levels: Basic and Instrumental. Basic Activities of Daily Living (BADL  or BADLs) consist of self-care tasks that include: Bathing and showering; personal hygiene and grooming (including brushing/combing/styling hair); dressing; Toilet hygiene (getting to the toilet, cleaning oneself, and getting back up); eating and self-feeding (not including cooking or chewing and swallowing); functional mobility, often referred to as "transferring", as measured by the ability to walk, get in and out of bed, and get into and out of a chair; the broader definition (moving from one place to another while performing activities) is useful for people with different physical abilities who are still able to get around independently. Basic ADLs include the things many people do when they get up in the morning and get ready to go out of the house: get out of bed, go to the toilet, bathe, dress, groom, and eat. On the average, loss of function typically follows a particular order. Hygiene is the first to go, followed by loss of toilet use and locomotion. The last to go is the ability to eat. When there is only one remaining area in which the person is independent, there is a 62.9% chance that it is eating and only a 3.5% chance that it is hygiene. Instrumental Activities   of Daily Living (IADL or IADLs) are not necessary for fundamental functioning, but they let an individual live independently in a community. IADL consist of tasks that include: cleaning and maintaining the house; home establishment and maintenance; care of others (including selecting and supervising caregivers); care of pets; child rearing; managing money; managing financials (investments, etc.); meal preparation  and cleanup; shopping for groceries and necessities; moving within the community; safety procedures and emergency responses; health management and maintenance (taking prescribed medications); and using the telephone or other form of communication.  Instructions:  Most patients tend to report their pain as a combination of two factors, their physical pain and their psychosocial pain. This last one is also known as "suffering" and it is reflection of how physical pain affects you socially and psychologically. From now on, report them separately.  From this point on, when asked to report your pain level, report only your physical pain. Use the following table for reference.  Pain Clinic Pain Levels (0-5/10)  Pain Level Score  Description  No Pain 0   Mild pain 1 Nagging, annoying, but does not interfere with basic activities of daily living (ADL). Patients are able to eat, bathe, get dressed, toileting (being able to get on and off the toilet and perform personal hygiene functions), transfer (move in and out of bed or a chair without assistance), and maintain continence (able to control bladder and bowel functions). Blood pressure and heart rate are unaffected. A normal heart rate for a healthy adult ranges from 60 to 100 bpm (beats per minute).   Mild to moderate pain 2 Noticeable and distracting. Impossible to hide from other people. More frequent flare-ups. Still possible to adapt and function close to normal. It can be very annoying and may have occasional stronger flare-ups. With discipline, patients may get used to it and adapt.   Moderate pain 3 Interferes significantly with activities of daily living (ADL). It becomes difficult to feed, bathe, get dressed, get on and off the toilet or to perform personal hygiene functions. Difficult to get in and out of bed or a chair without assistance. Very distracting. With effort, it can be ignored when deeply involved in activities.   Moderately severe pain  4 Impossible to ignore for more than a few minutes. With effort, patients may still be able to manage work or participate in some social activities. Very difficult to concentrate. Signs of autonomic nervous system discharge are evident: dilated pupils (mydriasis); mild sweating (diaphoresis); sleep interference. Heart rate becomes elevated (>115 bpm). Diastolic blood pressure (lower number) rises above 100 mmHg. Patients find relief in laying down and not moving.   Severe pain 5 Intense and extremely unpleasant. Associated with frowning face and frequent crying. Pain overwhelms the senses.  Ability to do any activity or maintain social relationships becomes significantly limited. Conversation becomes difficult. Pacing back and forth is common, as getting into a comfortable position is nearly impossible. Pain wakes you up from deep sleep. Physical signs will be obvious: pupillary dilation; increased sweating; goosebumps; brisk reflexes; cold, clammy hands and feet; nausea, vomiting or dry heaves; loss of appetite; significant sleep disturbance with inability to fall asleep or to remain asleep. When persistent, significant weight loss is observed due to the complete loss of appetite and sleep deprivation.  Blood pressure and heart rate becomes significantly elevated. Caution: If elevated blood pressure triggers a pounding headache associated with blurred vision, then the patient should immediately seek attention at an urgent or emergency care unit, as   these may be signs of an impending stroke.    Emergency Department Pain Levels (6-10/10)  Emergency Room Pain 6 Severely limiting. Requires emergency care and should not be seen or managed at an outpatient pain management facility. Communication becomes difficult and requires great effort. Assistance to reach the emergency department may be required. Facial flushing and profuse sweating along with potentially dangerous increases in heart rate and blood pressure  will be evident.   Distressing pain 7 Self-care is very difficult. Assistance is required to transport, or use restroom. Assistance to reach the emergency department will be required. Tasks requiring coordination, such as bathing and getting dressed become very difficult.   Disabling pain 8 Self-care is no longer possible. At this level, pain is disabling. The individual is unable to do even the most "basic" activities such as walking, eating, bathing, dressing, transferring to a bed, or toileting. Fine motor skills are lost. It is difficult to think clearly.   Incapacitating pain 9 Pain becomes incapacitating. Thought processing is no longer possible. Difficult to remember your own name. Control of movement and coordination are lost.   The worst pain imaginable 10 At this level, most patients pass out from pain. When this level is reached, collapse of the autonomic nervous system occurs, leading to a sudden drop in blood pressure and heart rate. This in turn results in a temporary and dramatic drop in blood flow to the brain, leading to a loss of consciousness. Fainting is one of the body's self defense mechanisms. Passing out puts the brain in a calmed state and causes it to shut down for a while, in order to begin the healing process.    Summary: 1. Refer to this scale when providing [place] with your pain level. 2. Be accurate and careful when reporting your pain level. This will help with your care. 3. Over-reporting your pain level will lead to loss of credibility. 4. Even a level of 1/10 means that there is pain and will be treated at our facility. 5. High, inaccurate reporting will be documented as "Symptom Exaggeration", leading to loss of credibility and suspicions of possible secondary gains such as obtaining more narcotics, or wanting to appear disabled, for fraudulent reasons. 6. Only pain levels of 5 or below will be seen at our facility. 7. Pain levels of 6 and above will be sent to the  Emergency Department and the appointment cancelled. ______________________________________________________________________________________________   BMI interpretation table: BMI level Category Range association with higher incidence of chronic pain  <18 kg/m2 Underweight   18.5-24.9 kg/m2 Ideal body weight   25-29.9 kg/m2 Overweight Increased incidence by 20%  30-34.9 kg/m2 Obese (Class I) Increased incidence by 68%  35-39.9 kg/m2 Severe obesity (Class II) Increased incidence by 136%  >40 kg/m2 Extreme obesity (Class III) Increased incidence by 254%   BMI Readings from Last 4 Encounters:  12/20/18 30.79 kg/m  11/30/18 31.07 kg/m  08/30/18 32.10 kg/m  03/09/18 32.97 kg/m   Wt Readings from Last 4 Encounters:  12/20/18 185 lb (83.9 kg)  11/30/18 181 lb (82.1 kg)  08/30/18 187 lb (84.8 kg)  03/09/18 192 lb 1.6 oz (87.1 kg)

## 2018-12-20 NOTE — Progress Notes (Addendum)
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              Palpation: No palpable anomalies              Specialized Test(s): Deferred         Specialized Test(s): Deferred          Thoracic Spine Exam  Inspection: No masses, redness, or swelling Alignment: Symmetrical Functional ROM: Unrestricted ROM Stability: No instability detected Sensory: Unimpaired Muscle strength & Tone: No palpable anomalies  Lumbar Spine Exam  Inspection: No masses, redness, or swelling Alignment: Symmetrical Functional ROM: Unrestricted ROM      Stability: No instability detected Muscle strength & Tone: Functionally intact Sensory: Unimpaired Palpation: Complains of area being tender to palpation       Provocative Tests: Lumbar Hyperextension and rotation test: Positive bilaterally for facet joint pain. Patrick's Maneuver: Positive for left-sided S-I arthralgia              Gait & Posture Assessment  Ambulation: Unassisted Gait: Relatively normal for age and body habitus Posture: WNL   Lower Extremity Exam     Side: Right lower extremity  Side: Left lower extremity  Inspection: No masses, redness, swelling, or asymmetry. No contractures  Inspection: No masses, redness, swelling, or asymmetry. 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 Diagnoses of Chronic pain of both lower extremities (  Secondary Area of Pain) disease LR, Chronic neck pain (Tertiary Area of Pain) (R>L), Chronic female pelvic pain (Fourth Area of Pain), Long term current use of opiate analgesic, Long term prescription benzodiazepine use, Pharmacologic therapy, Disorder of skeletal system, Problems influencing health status, and Chronic sacroiliac joint pain were also pertinent to this visit.  Visit Diagnosis: 1. 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 Chronic pain of both lower extremities (Secondary Area of Pain) disease LR   3. Chronic neck pain (Tertiary Area of Pain) (R>L)   4. Chronic female pelvic pain (Fourth Area of Pain)   5. Long term current use of opiate analgesic   6. Long term prescription benzodiazepine use   7. Pharmacologic therapy   8. Disorder of skeletal system   9. Problems influencing health status   10. Chronic sacroiliac joint pain    Plan of Care  Initial treatment plan:  Please be advised that as per protocol, today's visit has been an evaluation only. We have not taken over the patient's controlled substance management.  Problem-specific plan: No problem-specific Assessment & Plan notes found for this  encounter.  Ordered Lab-work, Procedure(s), Referral(s), & Consult(s): Orders Placed This Encounter  Procedures  . DG Cervical Spine Complete  . DG Si Joints  . Compliance Drug Analysis, Ur  . Comp. Metabolic Panel (12)  . Magnesium  . Vitamin B12  . Sedimentation rate  . 25-Hydroxyvitamin D Lcms D2+D3  . C-reactive protein   Pharmacotherapy: Medications ordered:  No orders of the defined types were placed in this encounter.  Medications administered during this visit: Charlynne Pander "Nichole" had no medications administered during this visit.   Pharmacotherapy under consideration:  Opioid Analgesics: The patient was informed that there is no guarantee that she would be a candidate for opioid analgesics. The decision will be made following CDC guidelines. This decision will be based on the results of diagnostic studies, as well as Ms. Downs's risk profile.  Patient informed of possible dose reduction in oxycodone secondary to her continued use of benzodiazepines with a stimulant and her increased overdose risk score membrane stabilizer: To be determined at a later time Muscle relaxant: To be determined at a later time NSAID: To be determined at a later time Other analgesic(s): To be determined at a later time   Interventional therapies under consideration: Ms. Felipe was informed that there is no guarantee that she would be a candidate for interventional therapies. The decision will be based on the results of diagnostic studies, as well as Ms. Swartz's risk profile.  Possible procedure(s): Diagnostic bilateral lumbar facet nerve blocks Possible bilateral lumbar facet radiofrequency ablation Diagnostic bilateral cervical facet nerve blocks Possible bilateral cervical facet radiofrequency ablation   Provider-requested follow-up: Return for 2nd Visit, w/ Dr. Dossie Arbour, medical record release.  Future Appointments  Date Time Provider [place]  01/16/2019 11:15 AM Milinda Pointer, MD [facility] None    Primary Care Physician: Birdie Sons, MD Location: [facility] Note by:  Date: 12/20/2018; Time: 3:27 PM  Pain Score Disclaimer: We use the NRS-11 scale. This is a self-reported, subjective measurement of pain severity with only modest accuracy. It is used primarily to identify changes within a particular patient. It must be understood that outpatient pain scales are significantly less accurate that those used for research, where they can be applied under ideal controlled circumstances with minimal exposure to variables. In reality, the score is  likely to be a combination of pain intensity and pain affect, where pain affect describes the degree of emotional arousal or changes in action readiness caused by the sensory experience of pain. Factors such as social and work situation, setting, emotional state, anxiety levels, expectation, and prior pain experience may influence pain perception and show large inter-individual differences that may also be affected by time variables.  Patient instructions provided during this appointment: Patient Instructions   ____________________________________________________________________________________________  Appointment Policy Summary  It is our goal and responsibility to provide the medical community with assistance in the evaluation and management of patients with chronic pain. Unfortunately our resources are limited. Because we do not have an unlimited amount of time, or available appointments, we are required to closely monitor and manage their use. The following rules exist to maximize their use:  Patient's responsibilities: 1. Punctuality:  At what time should I arrive? You should be physically present in our office 30 minutes before your scheduled appointment. Your scheduled appointment is with your assigned healthcare provider. However, it takes 5-10 minutes to be "checked-in", and  another 15 minutes for the nurses to do the admission. If you arrive to our office at the time you were given for your appointment, you will end up being at least 20-25 minutes late to your appointment with the provider. 2. Tardiness:  What happens if I arrive only a few minutes after my scheduled appointment time? You will need to reschedule your appointment. The cutoff is your appointment time. This is why it is so important that you arrive at least 30 minutes before that appointment. If you have an appointment scheduled for 10:00 AM and you arrive at 10:01, you will be required to reschedule your appointment.  3. Plan ahead:  Always assume that you will encounter traffic on your way in. Plan for it. If you are dependent on a driver, make sure they understand these rules and the need to arrive early. 4. Other appointments and responsibilities:  Avoid scheduling any other appointments before or after your pain clinic appointments.  5. Be prepared:  Write down everything that you need to discuss with your healthcare provider and give this information to the admitting nurse. Write down the medications that you will need refilled. Bring your pills and bottles (even the empty ones), to all of your appointments, except for those where a procedure is scheduled. 6. No children or pets:  Find someone to take care of them. It is not appropriate to bring them in. 7. Scheduling changes:  We request "advanced notification" of any changes or cancellations. 8. Advanced notification:  Defined as a time period of more than 24 hours prior to the originally scheduled appointment. This allows for the appointment to be offered to other patients. 9. Rescheduling:  When a visit is rescheduled, it will require the cancellation of the original appointment. For this reason they both fall within the category of "Cancellations".  10. Cancellations:  They require advanced notification. Any cancellation less than 24 hours  before the  appointment will be recorded as a "No Show". 11. No Show:  Defined as an unkept appointment where the patient failed to notify or declare to the practice their intention or inability to keep the appointment.  Corrective process for repeat offenders:  1. Tardiness: Three (3) episodes of rescheduling due to late arrivals will be recorded as one (1) "No Show". 2. Cancellation or reschedule: Three (3) cancellations or rescheduling will be recorded as one (1) "No Show". 3. "  No Shows": Three (3) "No Shows" within a 12 month period will result in discharge from the practice. ____________________________________________________________________________________________   ______________________________________________________________________________________________  Specialty Pain Scale  Introduction:  There are significant differences in how pain is reported. The word pain usually refers to physical pain, but it is also a common synonym of suffering. The medical community uses a scale from 0 (zero) to 10 (ten) to report pain level. Zero (0) is described as "no pain", while ten (10) is described as "the worse pain you can imagine". The problem with this scale is that physical pain is reported along with suffering. Suffering refers to mental pain, or more often yet it refers to any unpleasant feeling, emotion or aversion associated with the perception of harm or threat of harm. It is the psychological component of pain.  Pain Specialists prefer to separate the two components. The pain scale used by this practice is the Verbal Numerical Rating Scale (VNRS-11). This scale is for the physical pain only. DO NOT INCLUDE how your pain psychologically affects you. This scale is for adults 20 years of age and older. It has 11 (eleven) levels. The 1st level is 0/10. This means: "right now, I have no pain". In the context of pain management, it also means: "right now, my physical pain is under control with  the current therapy".  General Information:  The scale should reflect your current level of pain. Unless you are specifically asked for the level of your worst pain, or your average pain. If you are asked for one of these two, then it should be understood that it is over the past 24 hours.  Levels 1 (one) through 5 (five) are described below, and can be treated as an outpatient. Ambulatory pain management facilities such as ours are more than adequate to treat these levels. Levels 6 (six) through 10 (ten) are also described below, however, these must be treated as a hospitalized patient. While levels 6 (six) and 7 (seven) may be evaluated at an urgent care facility, levels 8 (eight) through 10 (ten) constitute medical emergencies and as such, they belong in a hospital's emergency department. When having these levels (as described below), do not come to our office. Our facility is not equipped to manage these levels. Go directly to an urgent care facility or an emergency department to be evaluated.  Definitions:  Activities of Daily Living (ADL): Activities of daily living (ADL or ADLs) is a term used in healthcare to refer to people's daily self-care activities. Health professionals often use a person's ability or inability to perform ADLs as a measurement of their functional status, particularly in regard to people post injury, with disabilities and the elderly. There are two ADL levels: Basic and Instrumental. Basic Activities of Daily Living (BADL  or BADLs) consist of self-care tasks that include: Bathing and showering; personal hygiene and grooming (including brushing/combing/styling hair); dressing; Toilet hygiene (getting to the toilet, cleaning oneself, and getting back up); eating and self-feeding (not including cooking or chewing and swallowing); functional mobility, often referred to as "transferring", as measured by the ability to walk, get in and out of bed, and get into and out of a chair; the  broader definition (moving from one place to another while performing activities) is useful for people with different physical abilities who are still able to get around independently. Basic ADLs include the things many people do when they get up in the morning and get ready to go out of the house: get out  of bed, go to the toilet, bathe, dress, groom, and eat. On the average, loss of function typically follows a particular order. Hygiene is the first to go, followed by loss of toilet use and locomotion. The last to go is the ability to eat. When there is only one remaining area in which the person is independent, there is a 62.9% chance that it is eating and only a 3.5% chance that it is hygiene. Instrumental Activities of Daily Living (IADL or IADLs) are not necessary for fundamental functioning, but they let an individual live independently in a community. IADL consist of tasks that include: cleaning and maintaining the house; home establishment and maintenance; care of others (including selecting and supervising caregivers); care of pets; child rearing; managing money; managing financials (investments, etc.); meal preparation and cleanup; shopping for groceries and necessities; moving within the community; safety procedures and emergency responses; health management and maintenance (taking prescribed medications); and using the telephone or other form of communication.  Instructions:  Most patients tend to report their pain as a combination of two factors, their physical pain and their psychosocial pain. This last one is also known as "suffering" and it is reflection of how physical pain affects you socially and psychologically. From now on, report them separately.  From this point on, when asked to report your pain level, report only your physical pain. Use the following table for reference.  Pain Clinic Pain Levels (0-5/10)  Pain Level Score  Description  No Pain 0   Mild pain 1 Nagging, annoying,  but does not interfere with basic activities of daily living (ADL). Patients are able to eat, bathe, get dressed, toileting (being able to get on and off the toilet and perform personal hygiene functions), transfer (move in and out of bed or a chair without assistance), and maintain continence (able to control bladder and bowel functions). Blood pressure and heart rate are unaffected. A normal heart rate for a healthy adult ranges from 60 to 100 bpm (beats per minute).   Mild to moderate pain 2 Noticeable and distracting. Impossible to hide from other people. More frequent flare-ups. Still possible to adapt and function close to normal. It can be very annoying and may have occasional stronger flare-ups. With discipline, patients may get used to it and adapt.   Moderate pain 3 Interferes significantly with activities of daily living (ADL). It becomes difficult to feed, bathe, get dressed, get on and off the toilet or to perform personal hygiene functions. Difficult to get in and out of bed or a chair without assistance. Very distracting. With effort, it can be ignored when deeply involved in activities.   Moderately severe pain 4 Impossible to ignore for more than a few minutes. With effort, patients may still be able to manage work or participate in some social activities. Very difficult to concentrate. Signs of autonomic nervous system discharge are evident: dilated pupils (mydriasis); mild sweating (diaphoresis); sleep interference. Heart rate becomes elevated (>115 bpm). Diastolic blood pressure (lower number) rises above 100 mmHg. Patients find relief in laying down and not moving.   Severe pain 5 Intense and extremely unpleasant. Associated with frowning face and frequent crying. Pain overwhelms the senses.  Ability to do any activity or maintain social relationships becomes significantly limited. Conversation becomes difficult. Pacing back and forth is common, as getting into a comfortable position is  nearly impossible. Pain wakes you up from deep sleep. Physical signs will be obvious: pupillary dilation; increased sweating; goosebumps; brisk reflexes; cold,  clammy hands and feet; nausea, vomiting or dry heaves; loss of appetite; significant sleep disturbance with inability to fall asleep or to remain asleep. When persistent, significant weight loss is observed due to the complete loss of appetite and sleep deprivation.  Blood pressure and heart rate becomes significantly elevated. Caution: If elevated blood pressure triggers a pounding headache associated with blurred vision, then the patient should immediately seek attention at an urgent or emergency care unit, as these may be signs of an impending stroke.    Emergency Department Pain Levels (6-10/10)  Emergency Room Pain 6 Severely limiting. Requires emergency care and should not be seen or managed at an outpatient pain management facility. Communication becomes difficult and requires great effort. Assistance to reach the emergency department may be required. Facial flushing and profuse sweating along with potentially dangerous increases in heart rate and blood pressure will be evident.   Distressing pain 7 Self-care is very difficult. Assistance is required to transport, or use restroom. Assistance to reach the emergency department will be required. Tasks requiring coordination, such as bathing and getting dressed become very difficult.   Disabling pain 8 Self-care is no longer possible. At this level, pain is disabling. The individual is unable to do even the most "basic" activities such as walking, eating, bathing, dressing, transferring to a bed, or toileting. Fine motor skills are lost. It is difficult to think clearly.   Incapacitating pain 9 Pain becomes incapacitating. Thought processing is no longer possible. Difficult to remember your own name. Control of movement and coordination are lost.   The worst pain imaginable 10 At this level,  most patients pass out from pain. When this level is reached, collapse of the autonomic nervous system occurs, leading to a sudden drop in blood pressure and heart rate. This in turn results in a temporary and dramatic drop in blood flow to the brain, leading to a loss of consciousness. Fainting is one of the body's self defense mechanisms. Passing out puts the brain in a calmed state and causes it to shut down for a while, in order to begin the healing process.    Summary: 1. Refer to this scale when providing [place] with your pain level. 2. Be accurate and careful when reporting your pain level. This will help with your care. 3. Over-reporting your pain level will lead to loss of credibility. 4. Even a level of 1/10 means that there is pain and will be treated at our facility. 5. High, inaccurate reporting will be documented as "Symptom Exaggeration", leading to loss of credibility and suspicions of possible secondary gains such as obtaining more narcotics, or wanting to appear disabled, for fraudulent reasons. 6. Only pain levels of 5 or below will be seen at our facility. 7. Pain levels of 6 and above will be sent to the Emergency Department and the appointment cancelled. ______________________________________________________________________________________________   BMI interpretation table: BMI level Category Range association with higher incidence of chronic pain  <18 kg/m2 Underweight   18.5-24.9 kg/m2 Ideal body weight   25-29.9 kg/m2 Overweight Increased incidence by 20%  30-34.9 kg/m2 Obese (Class I) Increased incidence by 68%  35-39.9 kg/m2 Severe obesity (Class II) Increased incidence by 136%  >40 kg/m2 Extreme obesity (Class III) Increased incidence by 254%   BMI Readings from Last 4 Encounters:  12/20/18 30.79 kg/m  11/30/18 31.07 kg/m  08/30/18 32.10 kg/m  03/09/18 32.97 kg/m   Wt Readings from Last 4 Encounters:  12/20/18 185 lb (83.9 kg)  11/30/18 181  lb (82.1 kg)   08/30/18 187 lb (84.8 kg)  03/09/18 192 lb 1.6 oz (87.1 kg)

## 2018-12-23 LAB — COMP. METABOLIC PANEL (12)
AST: 15 IU/L (ref 0–40)
Albumin/Globulin Ratio: 1.6 (ref 1.2–2.2)
Albumin: 4.1 g/dL (ref 3.8–4.8)
Alkaline Phosphatase: 96 IU/L (ref 39–117)
BUN/Creatinine Ratio: 22 (ref 9–23)
BUN: 13 mg/dL (ref 6–20)
Bilirubin Total: 0.2 mg/dL (ref 0.0–1.2)
Calcium: 9 mg/dL (ref 8.7–10.2)
Chloride: 103 mmol/L (ref 96–106)
Creatinine, Ser: 0.58 mg/dL (ref 0.57–1.00)
GFR calc Af Amer: 135 mL/min/{1.73_m2} (ref >59)
GFR calc non Af Amer: 117 mL/min/{1.73_m2} (ref >59)
Globulin, Total: 2.5 g/dL (ref 1.5–4.5)
Glucose: 109 mg/dL — ABNORMAL HIGH (ref 65–99)
Potassium: 4.3 mmol/L (ref 3.5–5.2)
Sodium: 138 mmol/L (ref 134–144)
Total Protein: 6.6 g/dL (ref 6.0–8.5)

## 2018-12-23 LAB — C-REACTIVE PROTEIN: CRP: 2 mg/L (ref 0–10)

## 2018-12-23 LAB — 25-HYDROXY VITAMIN D LCMS D2+D3
25-Hydroxy, Vitamin D-2: 4.5 ng/mL
25-Hydroxy, Vitamin D-3: 4.5 ng/mL
25-Hydroxy, Vitamin D: 9 ng/mL — ABNORMAL LOW

## 2018-12-23 LAB — SEDIMENTATION RATE: Sed Rate: 19 mm/hr (ref 0–32)

## 2018-12-23 LAB — COMPLIANCE DRUG ANALYSIS, UR

## 2018-12-23 LAB — MAGNESIUM: Magnesium: 2.3 mg/dL (ref 1.6–2.3)

## 2018-12-23 LAB — VITAMIN B12: Vitamin B-12: 381 pg/mL (ref 232–1245)

## 2018-12-26 ENCOUNTER — Encounter: Payer: Self-pay | Admitting: Nurse Practitioner

## 2018-12-26 DIAGNOSIS — E559 Vitamin D deficiency, unspecified: Secondary | ICD-10-CM | POA: Insufficient documentation

## 2019-01-16 ENCOUNTER — Other Ambulatory Visit: Payer: Self-pay

## 2019-01-16 ENCOUNTER — Ambulatory Visit: Payer: Medicaid Other | Attending: Pain Medicine | Admitting: Pain Medicine

## 2019-01-16 DIAGNOSIS — R102 Pelvic and perineal pain: Secondary | ICD-10-CM

## 2019-01-16 DIAGNOSIS — G8929 Other chronic pain: Secondary | ICD-10-CM

## 2019-01-16 DIAGNOSIS — M5442 Lumbago with sciatica, left side: Secondary | ICD-10-CM

## 2019-01-16 DIAGNOSIS — M79605 Pain in left leg: Secondary | ICD-10-CM

## 2019-01-16 DIAGNOSIS — M5441 Lumbago with sciatica, right side: Secondary | ICD-10-CM

## 2019-01-16 DIAGNOSIS — Z79899 Other long term (current) drug therapy: Secondary | ICD-10-CM

## 2019-01-16 DIAGNOSIS — M542 Cervicalgia: Secondary | ICD-10-CM

## 2019-01-16 DIAGNOSIS — M79604 Pain in right leg: Secondary | ICD-10-CM

## 2019-01-16 DIAGNOSIS — M797 Fibromyalgia: Secondary | ICD-10-CM

## 2019-01-16 DIAGNOSIS — Z79891 Long term (current) use of opiate analgesic: Secondary | ICD-10-CM

## 2019-01-16 NOTE — Progress Notes (Signed)
Patient's Name: Martha Guerra  MRN: [id number]  Referring Provider: Birdie Sons, MD  DOB: 21-Nov-1980  PCP: Birdie Sons, MD  DOS: 01/16/2019  Note by: Gaspar Cola, MD  Service setting: Virtual Visit (Telephone)  Attending: Gaspar Cola, MD  Location: Telephone Encounter  Specialty: Interventional Pain Management  Patient type: Established   Pain Management Encounter Note - Virtual Visit via Telephone Telehealth (real-time audio visits between healthcare provider and patient).  Patient's Phone No.:  [phone] (home); [phone] (mobile); (Preferred) [phone]  Pre-screening note:  Our staff contacted [name] and offered her an "in person", "face-to-face" appointment versus a telephone encounter. She indicated preferring the telephone encounter, at this time.   Primary Reason(s) for Virtual Visit: Encounter for evaluation before starting new chronic pain management plan of care (Level of risk: moderate) COVID-19*  Social distancing based on CDC ans AMA recommendations.   I contacted [name] on 01/16/2019 at 12:16 PM by telephone, but I got no answer. I left a message to call back. I clearly identified myself as Gaspar Cola, MD.  Location: [facility] Note by: Gaspar Cola, MD Date: 01/16/2019; Time: 12:16 PM

## 2019-01-30 DIAGNOSIS — L659 Nonscarring hair loss, unspecified: Secondary | ICD-10-CM | POA: Diagnosis not present

## 2019-01-30 DIAGNOSIS — L648 Other androgenic alopecia: Secondary | ICD-10-CM | POA: Diagnosis not present

## 2019-02-13 ENCOUNTER — Encounter: Payer: Self-pay | Admitting: Family Medicine

## 2019-02-13 ENCOUNTER — Ambulatory Visit: Payer: Medicaid Other | Admitting: Family Medicine

## 2019-02-13 ENCOUNTER — Other Ambulatory Visit: Payer: Self-pay

## 2019-02-13 VITALS — BP 122/70 | HR 82 | Temp 98.6°F | Resp 16 | Wt 181.0 lb

## 2019-02-13 DIAGNOSIS — F411 Generalized anxiety disorder: Secondary | ICD-10-CM

## 2019-02-13 DIAGNOSIS — Z79891 Long term (current) use of opiate analgesic: Secondary | ICD-10-CM | POA: Diagnosis not present

## 2019-02-13 DIAGNOSIS — D229 Melanocytic nevi, unspecified: Secondary | ICD-10-CM

## 2019-02-13 DIAGNOSIS — F5104 Psychophysiologic insomnia: Secondary | ICD-10-CM | POA: Diagnosis not present

## 2019-02-13 DIAGNOSIS — K2900 Acute gastritis without bleeding: Secondary | ICD-10-CM | POA: Diagnosis not present

## 2019-02-13 MED ORDER — ALPRAZOLAM 2 MG PO TABS: ORAL_TABLET | ORAL | 3 refills | Status: DC

## 2019-02-13 NOTE — Patient Instructions (Addendum)
.   Please review the attached list of medications and notify my office if there are any errors.   . You could try taking OTC melatonin up to 5-10mg  at bedtime to help sleep better

## 2019-02-13 NOTE — Progress Notes (Signed)
Patient: Martha Guerra Female    DOB: [birth year]   38 y.o.   MRN: [id number] Visit Date: 02/13/2019  Today's Provider: Lelon Huh, MD   No chief complaint on file.  Subjective:     HPI Patient comes in today wanting to discuss changing her dose of alprazolam. She is wanting to decrease her dose. Her pain doctor told her that she has to stop QHS alprazolam before she would agrees to prescribed QHS Oxycodone due to her having trouble sleeping due to pain. She has tried melatonin and trazodone in the past which she states did not help much. She currently takes up to 2 tablets of alprazolam during the day and another one at bedtime.   Also has moles on her back which she states have gotten larger lately and would like to have evaluated by her dermatologist.   She has states that she has been having trouble with epigastric burning, worse when she is hungry, improves when she eats. She does report a history of h.pylori treated several years ago.    Allergies  Allergen Reactions  . Tylenol [Acetaminophen]      Current Outpatient Medications:  .  alprazolam (XANAX) 2 MG tablet, TAKE 1/2 TABLET BY MOUTH FOUR TIMES DAILY AND TAKE ONE TABLET BY MOUTH AT BEDTIME AS NEEDED, Disp: 90 tablet, Rfl: 3 .  amphetamine-dextroamphetamine (ADDERALL) 10 MG tablet, Take by mouth., Disp: , Rfl:  .  lamoTRIgine (LAMICTAL) 100 MG tablet, , Disp: , Rfl: 4 .  lidocaine (XYLOCAINE) 2 % jelly, 1 application as needed., Disp: , Rfl:  .  lidocaine (XYLOCAINE) 5 % ointment, Apply 1-2 x a day as directed, Disp: , Rfl:  .  oxyCODONE (ROXICODONE) 15 MG immediate release tablet, Take 1 tablet by mouth 4 (four) times daily., Disp: , Rfl: 0 .  pentosan polysulfate (ELMIRON) 100 MG capsule, Take 100 mg by mouth 2 (two) times daily., Disp: , Rfl:  .  topiramate (TOPAMAX) 50 MG tablet, , Disp: , Rfl:  .  TRINTELLIX 20 MG TABS, , Disp: , Rfl: 4 .  linaclotide (LINZESS) 290 MCG CAPS capsule, Take 1 capsule  (290 mcg total) by mouth daily. (Patient not taking: Reported on 12/20/2018), Disp: 30 capsule, Rfl: 6 .  montelukast (SINGULAIR) 10 MG tablet, Take 1 tablet (10 mg total) by mouth at bedtime. (Patient not taking: Reported on 11/30/2018), Disp: 30 tablet, Rfl: 3 .  omeprazole (PRILOSEC) 40 MG capsule, Take 1 capsule (40 mg total) by mouth daily. (Patient not taking: Reported on 12/20/2018), Disp: 30 capsule, Rfl: 3 .  tretinoin (RETIN-A) 0.05 % cream, Apply topically at bedtime. (Patient not taking: Reported on 12/20/2018), Disp: 45 g, Rfl: 1 .  triamcinolone lotion (KENALOG) 0.1 %, Apply 1 application topically 2 (two) times daily. (Patient not taking: Reported on 12/20/2018), Disp: 60 mL, Rfl: 1  Review of Systems  Constitutional: Negative for appetite change, chills, fatigue and fever.  Respiratory: Negative for chest tightness and shortness of breath.   Cardiovascular: Negative for chest pain and palpitations.  Gastrointestinal: Negative for abdominal pain, nausea and vomiting.  Neurological: Negative for dizziness and weakness.    Social History   Tobacco Use  . Smoking status: Former Smoker    Packs/day: 0.50    Years: 17.00    Pack years: 8.50    Types: Cigarettes    Last attempt to quit: 12/12/2018    Years since quitting: 0.1  . Smokeless tobacco: Never Used  .  Tobacco comment: started smoking age 54, 1/2-1 ppd, quit smoking 01/2018  Substance Use Topics  . Alcohol use: No      Objective:   BP 122/70 (BP Location: Left Arm, Patient Position: Sitting, Cuff Size: Normal)   Pulse 82   Temp 98.6 F (37 C)   Resp 16   Wt 181 lb (82.1 kg)   SpO2 99%   BMI 30.12 kg/m  Vitals:   02/13/19 1400  BP: 122/70  Pulse: 82  Resp: 16  Temp: 98.6 F (37 C)  SpO2: 99%  Weight: 181 lb (82.1 kg)     Physical Exam  General appearance: alert, well developed, well nourished, cooperative and in no distress Head: Normocephalic, without obvious abnormality, atraumatic Respiratory:  Respirations even and unlabored, normal respiratory rate Extremities: No gross deformities Skin: Multiple cherry angiomas across torso. Several slightly pigments fleshy lesions back.   Psych: Appropriate mood and affect. Neurologic: Mental status: Alert, oriented to person, place, and time, thought content appropriate.     Assessment & Plan    1. Anxiety state Counseled on potential BZ withdrawal which may exacerbate insomnia as below. She is to d/c hs alprazolam if her pain doctor adds hs oxycodone and work on reducing alprazolam as able during the day.  - alprazolam (XANAX) 2 MG tablet; TAKE 1/2 TABLET BY MOUTH FOUR TIMES DAILY, NO MORE THAN 2 TABLETS PER DAY  Dispense: 60 tablet; Refill: 3  2. Psychophysiological insomnia Is to stop hs alprazolam as above and can try OTC melatonin.   3. Chronic use of opiate drug for therapeutic purpose   4. Enlarged skin mole  - Ambulatory referral to Dermatology  5. Acute gastritis without hemorrhage, unspecified gastritis type She does report history of h.pylori, currently only taking prn omeprazole.  - H. pylori breath test     Lelon Huh, MD  [facility]

## 2019-02-14 LAB — H. PYLORI BREATH TEST: H pylori Breath Test: NEGATIVE

## 2019-02-15 ENCOUNTER — Telehealth: Payer: Self-pay

## 2019-02-15 NOTE — Telephone Encounter (Signed)
-----   Message from Birdie Sons, MD sent at 02/15/2019  8:56 AM EDT ----- H. Pylori test is negative. She needs to avoid NSAID such as ibuprofen and naprosyn, and take omeprazole every day for the next 30 days. If her prescription is running out can send one refill.

## 2019-02-15 NOTE — Telephone Encounter (Signed)
Tried calling patient. Left message to call back. 

## 2019-02-17 ENCOUNTER — Other Ambulatory Visit: Payer: Self-pay

## 2019-02-17 DIAGNOSIS — K219 Gastro-esophageal reflux disease without esophagitis: Secondary | ICD-10-CM

## 2019-02-17 MED ORDER — OMEPRAZOLE 40 MG PO CPDR: 40.0000 mg | DELAYED_RELEASE_CAPSULE | Freq: Every day | ORAL | 0 refills | Status: DC

## 2019-02-17 NOTE — Telephone Encounter (Signed)
Spoke to pt and advised lab results and instructions.  Sent rx to pharmacy.  dbs

## 2019-03-07 ENCOUNTER — Ambulatory Visit: Payer: Medicaid Other | Admitting: Family Medicine

## 2019-03-07 DIAGNOSIS — T25212A Burn of second degree of left ankle, initial encounter: Secondary | ICD-10-CM | POA: Diagnosis not present

## 2019-03-07 DIAGNOSIS — Z23 Encounter for immunization: Secondary | ICD-10-CM | POA: Diagnosis not present

## 2019-03-07 DIAGNOSIS — L03116 Cellulitis of left lower limb: Secondary | ICD-10-CM | POA: Diagnosis not present

## 2019-03-09 DIAGNOSIS — B078 Other viral warts: Secondary | ICD-10-CM | POA: Diagnosis not present

## 2019-03-09 DIAGNOSIS — D1800 Hemangioma unspecified site: Secondary | ICD-10-CM | POA: Diagnosis not present

## 2019-03-09 DIAGNOSIS — D229 Melanocytic nevi, unspecified: Secondary | ICD-10-CM | POA: Diagnosis not present

## 2019-03-09 DIAGNOSIS — L918 Other hypertrophic disorders of the skin: Secondary | ICD-10-CM | POA: Diagnosis not present

## 2019-06-19 ENCOUNTER — Other Ambulatory Visit: Payer: Self-pay | Admitting: Family Medicine

## 2019-06-19 DIAGNOSIS — F411 Generalized anxiety disorder: Secondary | ICD-10-CM

## 2019-06-28 DIAGNOSIS — F900 Attention-deficit hyperactivity disorder, predominantly inattentive type: Secondary | ICD-10-CM | POA: Diagnosis not present

## 2019-06-28 DIAGNOSIS — F410 Panic disorder [episodic paroxysmal anxiety] without agoraphobia: Secondary | ICD-10-CM | POA: Diagnosis not present

## 2019-06-28 DIAGNOSIS — F4310 Post-traumatic stress disorder, unspecified: Secondary | ICD-10-CM | POA: Diagnosis not present

## 2019-07-20 ENCOUNTER — Other Ambulatory Visit: Payer: Self-pay | Admitting: Family Medicine

## 2019-07-20 DIAGNOSIS — K219 Gastro-esophageal reflux disease without esophagitis: Secondary | ICD-10-CM

## 2019-09-17 ENCOUNTER — Other Ambulatory Visit: Payer: Self-pay | Admitting: Family Medicine

## 2019-09-17 DIAGNOSIS — F411 Generalized anxiety disorder: Secondary | ICD-10-CM

## 2019-09-17 NOTE — Telephone Encounter (Signed)
Requested medication (s) are due for refill today: yes  Requested medication (s) are on the active medication list: yes  Last refill: 08/18/2019  Future visit scheduled: no  Notes to clinic:  Refill cannot be delegated    Requested Prescriptions  Pending Prescriptions Disp Refills   alprazolam (XANAX) 2 MG tablet [Pharmacy Med Name: ALPRAZOLAM 2 MG TAB] 60 tablet     Sig: TAKE 1/2 TABLET BY MOUTH 4 TIMES DAILY MAX 2 TABLETS PER DAY     Not Delegated - Psychiatry:  Anxiolytics/Hypnotics Failed - 09/17/2019  1:44 PM      Failed - This refill cannot be delegated      Failed - Urine Drug Screen completed in last 360 days.      Failed - Valid encounter within last 6 months    Recent Outpatient Visits          7 months ago [place], Donald E, MD   9 months ago Chronic interstitial cystitis   [facility] Birdie Sons, MD   1 year ago Chronic interstitial cystitis   [facility] Birdie Sons, MD   1 year ago Lesion of cervix   [facility] Trinna Post, [place]   1 year ago Hoarseness of voice   [facility] Birdie Sons, MD

## 2019-10-09 ENCOUNTER — Ambulatory Visit: Payer: Self-pay

## 2019-10-09 NOTE — Telephone Encounter (Signed)
Went ahead and booked a virtual with Dr. Caryn Section at 10:40 for 10/10/2019. He had a slot come open. Patient advised if symptoms worsened or if she developed any cardio symptoms to go to the UC or ER for evaluation. She verbalized understanding.

## 2019-10-09 NOTE — Telephone Encounter (Signed)
Double book my 2:20 Vaccine appointments on 10/10/2019 and make it virtual. She can cancel the Wednesday appointment if she comes in tomorrow.

## 2019-10-09 NOTE — Progress Notes (Signed)
Patient: Martha Guerra Female    DOB: 1981/08/10   38 y.o.   MRN: [id number] Visit Date: 10/09/2019  Today's Provider: Lelon Huh, MD   No chief complaint on file.  Subjective:    Virtual Visit via Telephone Note  I connected with Martha Guerra on 10/09/19 at 10:40 AM EST by telephone and verified that I am speaking with the correct person using two identifiers.  Location: Patient:  Provider:    I discussed the limitations, risks, security and privacy concerns of performing an evaluation and management service by telephone and the availability of in person appointments. I also discussed with the patient that there may be a patient responsible charge related to this service. The patient expressed understanding and agreed to proceed.  URI  This is a new problem. Episode onset: approximately 1 week ago. The problem has been gradually improving. There has been no fever. Associated symptoms include congestion, coughing, neck pain and a sore throat. Chest pain: chest tightness. Treatments tried: OTC cold medication. The treatment provided moderate relief.    Allergies  Allergen Reactions  . Prednisone Shortness Of Breath  . Acetaminophen Other (See Comments) and Nausea Only     Current Outpatient Medications:  .  alprazolam (XANAX) 2 MG tablet, TAKE 1/2 TABLET BY MOUTH 4 TIMES DAILY MAX 2 TABLETS PER DAY, Disp: 60 tablet, Rfl: 5 .  amphetamine-dextroamphetamine (ADDERALL) 10 MG tablet, Take by mouth., Disp: , Rfl:  .  lamoTRIgine (LAMICTAL) 100 MG tablet, , Disp: , Rfl: 4 .  lidocaine (XYLOCAINE) 2 % jelly, 1 application as needed., Disp: , Rfl:  .  lidocaine (XYLOCAINE) 5 % ointment, Apply 1-2 x a day as directed, Disp: , Rfl:  .  linaclotide (LINZESS) 290 MCG CAPS capsule, Take 1 capsule (290 mcg total) by mouth daily. (Patient not taking: Reported on 12/20/2018), Disp: 30 capsule, Rfl: 6 .  montelukast (SINGULAIR) 10 MG tablet, Take 1 tablet (10 mg total) by  mouth at bedtime. (Patient not taking: Reported on 11/30/2018), Disp: 30 tablet, Rfl: 3 .  omeprazole (PRILOSEC) 40 MG capsule, TAKE 1 CAPSULE BY MOUTH ONCE DAILY, Disp: 90 capsule, Rfl: 4 .  oxyCODONE (ROXICODONE) 15 MG immediate release tablet, Take 1 tablet by mouth 4 (four) times daily., Disp: , Rfl: 0 .  pentosan polysulfate (ELMIRON) 100 MG capsule, Take 100 mg by mouth 2 (two) times daily., Disp: , Rfl:  .  topiramate (TOPAMAX) 50 MG tablet, , Disp: , Rfl:  .  tretinoin (RETIN-A) 0.05 % cream, Apply topically at bedtime. (Patient not taking: Reported on 12/20/2018), Disp: 45 g, Rfl: 1 .  triamcinolone lotion (KENALOG) 0.1 %, Apply 1 application topically 2 (two) times daily. (Patient not taking: Reported on 12/20/2018), Disp: 60 mL, Rfl: 1 .  TRINTELLIX 20 MG TABS, , Disp: , Rfl: 4  Review of Systems  Constitutional: Negative for fever.  HENT: Positive for congestion and sore throat.   Respiratory: Positive for cough and chest tightness.   Cardiovascular: Negative.  Chest pain: chest tightness.  Musculoskeletal: Positive for neck pain.    Social History   Tobacco Use  . Smoking status: Former Smoker    Packs/day: 0.50    Years: 17.00    Pack years: 8.50    Types: Cigarettes    Quit date: 12/12/2018    Years since quitting: 0.8  . Smokeless tobacco: Never Used  . Tobacco comment: started smoking age 64, 1/2-1 ppd, quit smoking 01/2018  Substance Use Topics  . Alcohol use: No      Objective:   There were no vitals taken for this visit. There were no vitals filed for this visit.There is no height or weight on file to calculate BMI.   Physical Exam   No results found for any visits on 10/10/19.     Assessment & Plan    I discussed the assessment and treatment plan with the patient. The patient was provided an opportunity to ask questions and all were answered. The patient agreed with the plan and demonstrated an understanding of the instructions.   The patient was advised  to call back or seek an in-person evaluation if the symptoms worsen or if the condition fails to improve as anticipated.  I provided  minutes of non-face-to-face time during this encounter.     Lelon Huh, MD  [facility]

## 2019-10-09 NOTE — Telephone Encounter (Signed)
Pt. Reports she has an appointment Wednesday for chest tightness, but is now having sinus and chest congestion, productive cough, neck pain, chest tightness. Denies fever and shortness of breath. States when she feels like this "it is usually upper respiratory infection." Has green mucus.No availability before Wednesday and that appointment is a face to face appointment. Please advise pt.   Reason for Disposition . [1] Chest pain lasts > 5 minutes AND [2] occurred > 3 days ago (72 hours) AND [3] NO chest pain or cardiac symptoms now  Answer Assessment - Initial Assessment Questions 1. LOCATION: "Where does it hurt?"       Hurts middle  2. RADIATION: "Does the pain go anywhere else?" (e.g., into neck, jaw, arms, back)     No 3. ONSET: "When did the chest pain begin?" (Minutes, hours or days)      Started last week 4. PATTERN "Does the pain come and go, or has it been constant since it started?"  "Does it get worse with exertion?"      Constant 5. DURATION: "How long does it last" (e.g., seconds, minutes, hours)     Constant 6. SEVERITY: "How bad is the pain?"  (e.g., Scale 1-10; mild, moderate, or severe)    - MILD (1-3): doesn't interfere with normal activities     - MODERATE (4-7): interferes with normal activities or awakens from sleep    - SEVERE (8-10): excruciating pain, unable to do any normal activities         Tightness -Mild 7. CARDIAC RISK FACTORS: "Do you have any history of heart problems or risk factors for heart disease?" (e.g., angina, prior heart attack; diabetes, high blood pressure, high cholesterol, smoker, or strong family history of heart disease)      No 8. PULMONARY RISK FACTORS: "Do you have any history of lung disease?"  (e.g., blood clots in lung, asthma, emphysema, birth control pills)     No 9. CAUSE: "What do you think is causing the chest pain?"     Upper respiratory 10. OTHER SYMPTOMS: "Do you have any other symptoms?" (e.g., dizziness, nausea, vomiting,  sweating, fever, difficulty breathing, cough)       Productive cough, neck pain, sore throat, sinus pressure 11. PREGNANCY: "Is there any chance you are pregnant?" "When was your last menstrual period?"       No  Protocols used: CHEST PAIN-A-AH

## 2019-10-09 NOTE — Telephone Encounter (Signed)
Please review, patient is waiting for advice

## 2019-10-10 ENCOUNTER — Ambulatory Visit (INDEPENDENT_AMBULATORY_CARE_PROVIDER_SITE_OTHER): Payer: Medicaid Other | Admitting: Family Medicine

## 2019-10-10 ENCOUNTER — Telehealth (INDEPENDENT_AMBULATORY_CARE_PROVIDER_SITE_OTHER): Payer: Medicaid Other | Admitting: Family Medicine

## 2019-10-10 ENCOUNTER — Encounter: Payer: Self-pay | Admitting: Family Medicine

## 2019-10-10 DIAGNOSIS — J40 Bronchitis, not specified as acute or chronic: Secondary | ICD-10-CM

## 2019-10-10 MED ORDER — DOXYCYCLINE HYCLATE 100 MG PO TABS: 100.0000 mg | ORAL_TABLET | Freq: Two times a day (BID) | ORAL | 0 refills | Status: AC

## 2019-10-10 MED ORDER — ALBUTEROL SULFATE HFA 108 (90 BASE) MCG/ACT IN AERS: 1.0000 | INHALATION_SPRAY | Freq: Four times a day (QID) | RESPIRATORY_TRACT | 0 refills | Status: DC | PRN

## 2019-10-10 NOTE — Progress Notes (Signed)
Patient: Martha Guerra Female    DOB: 02-Nov-1980   38 y.o.   MRN: [id number] Visit Date: 10/10/2019  Today's Provider: Lelon Huh, MD   Chief Complaint  Patient presents with  . URI   Subjective:    Virtual Visit via Telephone Note  I connected with Martha Guerra on 10/10/19 at 10:40 AM EST by telephone and verified that I am speaking with the correct person using two identifiers.  Location: Patient: home Provider: bfp   I discussed the limitations, risks, security and privacy concerns of performing an evaluation and management service by telephone and the availability of in person appointments. I also discussed with the patient that there may be a patient responsible charge related to this service. The patient expressed understanding and agreed to proceed.  URI  This is a new problem. Episode onset: 1 week ago. The problem has been gradually improving. There has been no fever. Associated symptoms include congestion, coughing, neck pain and a sore throat. Chest pain: chest tightness. Treatments tried: OTC cold medication. The treatment provided moderate relief.  Yesterday woke up with productive cough, generalized muscle aches and neck aches. Sore throat yesterday. Not checking temperature but feels "feverish" taking ibuprofen around the clock. Feels tight in chest.   Allergies  Allergen Reactions  . Prednisone Shortness Of Breath  . Acetaminophen Other (See Comments) and Nausea Only     Current Outpatient Medications:  .  alprazolam (XANAX) 2 MG tablet, TAKE 1/2 TABLET BY MOUTH 4 TIMES DAILY MAX 2 TABLETS PER DAY, Disp: 60 tablet, Rfl: 5 .  amphetamine-dextroamphetamine (ADDERALL) 10 MG tablet, Take by mouth., Disp: , Rfl:  .  lamoTRIgine (LAMICTAL) 100 MG tablet, , Disp: , Rfl: 4 .  lidocaine (XYLOCAINE) 2 % jelly, 1 application as needed., Disp: , Rfl:  .  lidocaine (XYLOCAINE) 5 % ointment, Apply 1-2 x a day as directed, Disp: , Rfl:  .  omeprazole  (PRILOSEC) 40 MG capsule, TAKE 1 CAPSULE BY MOUTH ONCE DAILY, Disp: 90 capsule, Rfl: 4 .  oxyCODONE (ROXICODONE) 15 MG immediate release tablet, Take 1 tablet by mouth 4 (four) times daily., Disp: , Rfl: 0 .  pentosan polysulfate (ELMIRON) 100 MG capsule, Take 100 mg by mouth 2 (two) times daily., Disp: , Rfl:  .  TRINTELLIX 20 MG TABS, , Disp: , Rfl: 4  Review of Systems  Constitutional: Negative for fever.  HENT: Positive for congestion and sore throat.   Respiratory: Positive for cough.   Cardiovascular: Chest pain: chest tightness.  Musculoskeletal: Positive for neck pain.    Social History   Tobacco Use  . Smoking status: Former Smoker    Packs/day: 0.50    Years: 17.00    Pack years: 8.50    Types: Cigarettes    Quit date: 12/12/2018    Years since quitting: 0.8  . Smokeless tobacco: Never Used  . Tobacco comment: started smoking age 66, 1/2-1 ppd, quit smoking 01/2018  Substance Use Topics  . Alcohol use: No      Objective:     Physical Exam  Awake, alert, oriented x 3. In no apparent distress  No results found for any visits on 10/10/19.     Assessment & Plan      1. Bronchitis  - doxycycline (VIBRA-TABS) 100 MG tablet; Take 1 tablet (100 mg total) by mouth 2 (two) times daily for 10 days.  Dispense: 20 tablet; Refill: 0 - albuterol (VENTOLIN HFA) 108 (90  Base) MCG/ACT inhaler; Inhale 1-2 puffs into the lungs every 6 (six) hours as needed for shortness of breath.  Dispense: 18 g; Refill: 0  I discussed the assessment and treatment plan with the patient. The patient was provided an opportunity to ask questions and all were answered. The patient agreed with the plan and demonstrated an understanding of the instructions.   The patient was advised to call back or seek an in-person evaluation if the symptoms worsen or if the condition fails to improve as anticipated.  I provided 11 minutes of non-face-to-face time during this encounter.     Lelon Huh, MD    [facility]

## 2019-10-11 ENCOUNTER — Ambulatory Visit: Payer: Medicaid Other | Admitting: Family Medicine

## 2019-10-11 ENCOUNTER — Ambulatory Visit: Payer: Medicaid Other | Attending: Internal Medicine

## 2019-10-11 DIAGNOSIS — Z20822 Contact with and (suspected) exposure to covid-19: Secondary | ICD-10-CM

## 2019-10-11 DIAGNOSIS — Z20828 Contact with and (suspected) exposure to other viral communicable diseases: Secondary | ICD-10-CM | POA: Diagnosis not present

## 2019-10-12 LAB — NOVEL CORONAVIRUS, NAA: SARS-CoV-2, NAA: NOT DETECTED

## 2019-10-17 ENCOUNTER — Ambulatory Visit: Payer: Self-pay | Admitting: *Deleted

## 2019-10-17 ENCOUNTER — Other Ambulatory Visit: Payer: Self-pay | Admitting: Family Medicine

## 2019-10-17 DIAGNOSIS — F411 Generalized anxiety disorder: Secondary | ICD-10-CM

## 2019-10-17 NOTE — Telephone Encounter (Signed)
alprazolam Duanne Moron) 2 MG tablet    Patient is requesting refill.    Pharmacy:  [facility] [place], [place] AT [facility] [address] [facility] Phone:  [phone]  Fax:  [phone]

## 2019-10-17 NOTE — Telephone Encounter (Signed)
  Message from Sheran Luz sent at 10/17/2019 2:58 PM EST  Summary: cough    Patient requesting call back from RN to discuss chest cold. She states she has a deep cough. Would like recommendations.          Returned pt's call, states cough has changed since office visit and is now coughing up a lot of phlegm and has a lot of mucous from sinuses. Pt almost finished antibiotic, Doxycycline, and feels she needs something else, she suggested Z-pack.PCP will be notified for further recommendations. Reason for Disposition . [1] Recent medical visit within 24 hours AND [2] condition/symptoms SAME (unchanged) AND [3] caller has additional questions triager can answer  Answer Assessment - Initial Assessment Questions 1. MAIN CONCERN OR SYMPTOM:  "What is your main concern right now?" "What question do you have?" "What's the main symptom you're worried about?" (e.g., breathing difficulty, cough, fever. pain)     Finishing antibiotic and doesn't feel like she is completely well. 2. ONSET: "When did the cough  start?"     Prior to visit on 12/22. 3. BETTER-SAME-WORSE: "Are you getting better, staying the same, or getting worse compared to how you felt at your last visit to the doctor (most recent medical visit)"?     Now the cough is productive and blowing a lot of mucous out of sinuses. 4. VISIT DATE: "When were you seen?" (Date)     12/22 5. VISIT DOCTOR: "What is the name of the doctor taking care of you now?"     Dr. Caryn Section 6. VISIT DIAGNOSIS:  "What was the main symptom or problem that you were seen for?" "Were you given a diagnosis?"      Bronchitis, given Doxycycline which is almost completed. 7. VISIT MEDICATIONS: "Did the physician order any new medicines for you to use?" If yes: "Have you filled the prescription and started taking the medicine?"      Yes 8. NEXT APPOINTMENT: "Have you scheduled a follow-up appointment with your doctor?"     No 9. PAIN: "Is there any pain?" If so,  ask: "How bad is it?"  (Scale 1-10; or mild, moderate, severe)     No 10. FEVER: "Do you have a fever?" If so, ask: "What is it, how was it measured  and when did it start?"       Unsure, has been taking Ibuprofen around the clock 11. OTHER SYMPTOMS: "Do you have any other symptoms?"       No  Protocols used: RECENT MEDICAL VISIT FOR ILLNESS FOLLOW-UP CALL-A-AH

## 2019-10-18 MED ORDER — ALPRAZOLAM 2 MG PO TABS: 1.0000 mg | ORAL_TABLET | Freq: Four times a day (QID) | ORAL | 5 refills | Status: DC

## 2019-10-18 NOTE — Telephone Encounter (Signed)
I would actually order another Covid test and add prednisone 10mg --6 day taper,#21

## 2019-10-18 NOTE — Telephone Encounter (Signed)
Please review, patient of Dr Wanda Plump

## 2019-10-18 NOTE — Telephone Encounter (Signed)
Please advise? Patient had a telephone visit with Dr. Caryn Section 10/10/2019 and was given rx for doxycycline.

## 2019-10-18 NOTE — Telephone Encounter (Signed)
Patient was advised. Patient states she had a negative reaction to prednisone in the past and does not want to take it again. Patient did however state she will go get retested for covid.

## 2019-10-31 NOTE — Progress Notes (Signed)
encounter opened in error

## 2019-10-31 NOTE — Patient Instructions (Signed)
.   Please review the attached list of medications and notify my office if there are any errors.   . Please bring all of your medications to every appointment so we can make sure that our medication list is the same as yours.   

## 2020-01-11 ENCOUNTER — Telehealth: Payer: Self-pay | Admitting: General Practice

## 2020-01-11 NOTE — Telephone Encounter (Signed)
Pt left VM stating that an application had been sent but never received. Tried calling @ 12:25 3/25 but unable to LVM. Pls call back.

## 2020-01-24 DIAGNOSIS — Z79899 Other long term (current) drug therapy: Secondary | ICD-10-CM | POA: Diagnosis not present

## 2020-01-24 DIAGNOSIS — F4310 Post-traumatic stress disorder, unspecified: Secondary | ICD-10-CM | POA: Diagnosis not present

## 2020-01-24 DIAGNOSIS — F410 Panic disorder [episodic paroxysmal anxiety] without agoraphobia: Secondary | ICD-10-CM | POA: Diagnosis not present

## 2020-01-24 DIAGNOSIS — F900 Attention-deficit hyperactivity disorder, predominantly inattentive type: Secondary | ICD-10-CM | POA: Diagnosis not present

## 2020-02-21 NOTE — [place] Notes (Signed)
Established patient visit   Patient: Martha Guerra   DOB: [birth year]   39 y.o. Female  MRN: [id number] Visit Date: 02/22/2020  Today's healthcare provider: Mar Daring, PA-C   Chief Complaint  Patient presents with  . Back Pain   Mertie Moores as a scribe for [employer], PA-C.,have documented all relevant documentation on the behalf of Mar Daring, PA-C,as directed by  Mar Daring, PA-C while in the presence of Mar Daring, PA-C.  Subjective    Back Pain This is a chronic problem. The current episode started in the past 7 days. The problem occurs constantly. The problem has been gradually worsening since onset. The pain is present in the lumbar spine. Quality: sharp. The pain does not radiate. The pain is at a severity of 6/10. The symptoms are aggravated by bending and position. Pertinent negatives include no numbness or weakness.  Acute on chronic back pain. Is followed by pain management. Patient is requesting a referral for a MRI. Last one was 2018. Known lumbar disc disease with multi-level bulges.  Patient Active Problem List   Diagnosis Date Noted  . Vitamin D deficiency 12/26/2018  . Chronic bilateral low back pain with bilateral sciatica (Primary Area of Pain) (L>R) 12/20/2018  . Chronic pain of both lower extremities (Secondary Area of Pain) disease LR 12/20/2018  . Chronic neck pain [facility] Area of Pain) (R>L) 12/20/2018  . Chronic female pelvic pain (Fourth Area of Pain) 12/20/2018  . Long term current use of opiate analgesic 12/20/2018  . Long term prescription benzodiazepine use 12/20/2018  . Pharmacologic therapy 12/20/2018  . Disorder of skeletal system 12/20/2018  . Problems influencing health status 12/20/2018  . Chronic back pain 08/30/2018  . Neck pain 08/30/2018  . Lumbar herniated disc 08/30/2018  . Urinary tract infection 02/26/2016  . Insomnia 07/15/2015  . H/O disease 07/15/2015  . H/O  urinary stone 07/15/2015  . Gastric catarrh 07/15/2015  . Fibromyalgia 11/13/2014  . Chronic use of opiate drug for therapeutic purpose 10/25/2014  . Leukocytes in urine 10/25/2014  . Anxiety state 08/07/2014  . CAFL (chronic airflow limitation) ([place]) 08/07/2014  . GERD (gastroesophageal reflux disease) 08/07/2014  . Compulsive tobacco user syndrome 08/07/2014  . Major depressive disorder, single episode 08/07/2014  . Constipation 08/07/2014  . Elevation of level of transaminase and lactic acid dehydrogenase (LDH) 08/07/2014  . Right upper quadrant pain 08/07/2014  . Tobacco use disorder 08/07/2014  . Backache 08/07/2014  . Chronic obstructive pulmonary disease ([place]) 08/07/2014  . Esophageal reflux 08/07/2014  . Palpitations 06/13/2013  . Hematuria 06/29/2012  . Myalgia and myositis 06/29/2012  . High-tone pelvic floor dysfunction 04/27/2012  . Interstitial cystitis (chronic) without hematuria 08/19/2011  . Dysuria 08/19/2011  . Dyspareunia 08/19/2011  . Increased frequency of urination 08/19/2011  . Nocturia 08/19/2011  . Reduced libido 08/19/2011  . Urinary urgency 08/19/2011   Past Medical History:  Diagnosis Date  . Anxiety   . Asthma   . Chronic constipation   . Cystitides, interstitial, chronic   . Depression   . Gastritis   . GERD (gastroesophageal reflux disease)   . History of kidney stones   . History of ovarian cyst    Allergies  Allergen Reactions  . Prednisone Shortness Of Breath  . Acetaminophen Other (See Comments) and Nausea Only       Medications: Outpatient Medications Prior to Visit  Medication Sig  . alprazolam (XANAX) 2 MG tablet Take 0.5  tablets (1 mg total) by mouth 4 (four) times daily.  Marland Kitchen amphetamine-dextroamphetamine (ADDERALL) 10 MG tablet Take by mouth.  . lamoTRIgine (LAMICTAL) 100 MG tablet   . lidocaine (XYLOCAINE) 2 % jelly 1 application as needed.  . lidocaine (XYLOCAINE) 5 % ointment Apply 1-2 x a day as directed  . omeprazole  (PRILOSEC) 40 MG capsule TAKE 1 CAPSULE BY MOUTH ONCE DAILY  . oxyCODONE (ROXICODONE) 15 MG immediate release tablet Take 1 tablet by mouth 4 (four) times daily.  . pentosan polysulfate (ELMIRON) 100 MG capsule Take 100 mg by mouth 2 (two) times daily.  . TRINTELLIX 20 MG TABS   . albuterol (VENTOLIN HFA) 108 (90 Base) MCG/ACT inhaler Inhale 1-2 puffs into the lungs every 6 (six) hours as needed for shortness of breath.   No facility-administered medications prior to visit.    Review of Systems  Constitutional: Negative.   Respiratory: Negative.   Cardiovascular: Negative.   Musculoskeletal: Positive for back pain, gait problem and myalgias.  Neurological: Negative for weakness and numbness.    Last CBC Lab Results  Component Value Date   WBC 8.9 12/02/2018   HGB 13.9 12/02/2018   HCT 40.3 12/02/2018   MCV 87 12/02/2018   MCH 30.1 12/02/2018   RDW 13.3 12/02/2018   PLT 337 123456   Last metabolic panel Lab Results  Component Value Date   GLUCOSE 109 (H) 12/20/2018   NA 138 12/20/2018   K 4.3 12/20/2018   CL 103 12/20/2018   CO2 23 12/02/2018   BUN 13 12/20/2018   CREATININE 0.58 12/20/2018   GFRNONAA 117 12/20/2018   GFRAA 135 12/20/2018   CALCIUM 9.0 12/20/2018   PROT 6.6 12/20/2018   ALBUMIN 4.1 12/20/2018   LABGLOB 2.5 12/20/2018   AGRATIO 1.6 12/20/2018   BILITOT 0.2 12/20/2018   ALKPHOS 96 12/20/2018   AST 15 12/20/2018   ALT 27 12/02/2018      Objective    BP 105/71 (BP Location: Left Arm, Patient Position: Sitting, Cuff Size: Large)   Pulse (!) 114   Temp (!) 96.8 F (36 C) (Temporal)   Wt 190 lb 3.2 oz (86.3 kg)   BMI 31.65 kg/m  BP Readings from Last 3 Encounters:  02/22/20 105/71  02/13/19 122/70  12/20/18 106/85   Wt Readings from Last 3 Encounters:  02/22/20 190 lb 3.2 oz (86.3 kg)  02/13/19 181 lb (82.1 kg)  12/20/18 185 lb (83.9 kg)      Physical Exam Vitals reviewed.  Constitutional:      General: She is not in acute  distress.    Appearance: Normal appearance. She is well-developed. She is not ill-appearing.  HENT:     Head: Normocephalic and atraumatic.  Pulmonary:     Effort: Pulmonary effort is normal. No respiratory distress.  Musculoskeletal:     Cervical back: Normal range of motion and neck supple.     Comments: Limited due to patient pain. Uncomfortable appearing even sitting  Neurological:     Mental Status: She is alert.  Psychiatric:        Behavior: Behavior normal.        Thought Content: Thought content normal.        Judgment: Judgment normal.      CLINICAL DATA:  Initial evaluation for chronic low back pain with numbness and anterior left thigh.  EXAM: MRI LUMBAR SPINE WITHOUT CONTRAST  TECHNIQUE: Multiplanar, multisequence MR imaging of the lumbar spine was performed. No intravenous contrast was administered.  COMPARISON:  Prior radiograph from 02/19/2015 as well as previous MRI from 11/12/2014.  FINDINGS: Segmentation: Normal segmentation. Lowest well-formed disc labeled the L5-S1 level.  Alignment: Vertebral bodies normally aligned with preservation of the normal lumbar lordosis. No listhesis.  Vertebrae: Vertebral body heights are maintained. No evidence for acute or chronic fracture. Signal intensity within the vertebral body bone marrow is diffusely decreased on T1 weighted imaging, most commonly related to anemia, smoking, or obesity. Finding is similar to previous. Mild reactive endplate changes noted about the anterior aspect of the T12-L1, L1-2, and L2-3 interspaces. No discrete or worrisome osseous lesions.  Conus medullaris: Extends to the L1 level and appears normal.  Paraspinal and other soft tissues: Paraspinous soft tissues within normal limits. Tiny subcentimeter T2 hyperintense cysts noted within the posterior right kidney. Visualized visceral structures otherwise unremarkable.  Disc levels:  L1-2:  Unremarkable.  L2-3: Mild  diffuse disc bulge with disc desiccation. No stenosis or impingement.  L3-4: Shallow left subarticular disc protrusion minimally encroaches upon the left lateral recess (series 5, image 23). Protruding disc contacts the descending left L4 nerve root without frank impingement or displacement. Minimal left lateral recess narrowing without significant canal stenosis. No significant foraminal encroachment.  L4-5: Mild disc bulging with biforaminal annular fissures, slightly more prominent on the right (series 5, image 29). No neural impingement. No significant canal or foraminal stenosis.  L5-S1: Subtle shallow right foraminal disc protrusion closely approximates the exiting right L5 nerve root without impingement (series 5, image 35). This is similar to previous. No canal or foraminal stenosis.  IMPRESSION: 1. Subtle small left subarticular disc protrusion at L3-4, contacting and potentially irritating the descending left L4 nerve root. 2. Disc bulge with biforaminal annular fissures at L4-5, closely approximating the exiting L4 nerve roots without impingement. 3. Shallow right foraminal disc protrusion at L5-S1, closely approximating the right L5 nerve root without impingement. 4. Diffusely decreased T1 signal intensity within the visualized bone marrow, most commonly related to anemia, smoking, or obesity. Correlation with laboratory values suggested. Finding is similar to previous.   Electronically Signed   By: Jeannine Boga M.D.   On: 06/30/2017 21:37   No results found for any visits on 02/22/20.  Assessment & Plan     1. Lumbar herniated disc Voltaren gel for topical pain relief prn. Will get new lumbar xrays to hopefully try to get MRI approved for acute on chronic low back pain. Toradol IM given in office without issue. Narcotic pain medications from pain management. Will refer to Dr. Sharlet Salina' office as below for considerations in ESI in conjunction with  patient's pain management (sees Dr. Dossie Arbour) as patient reports pain management does not do them. She is interested in conservative managements and trying to avoid surgery. Call if worsening.  - diclofenac Sodium (VOLTAREN) 1 % GEL; Apply 2 g topically 4 (four) times daily.  Dispense: 350 g; Refill: 0 - DG Lumbar Spine Complete; Future - ketorolac (TORADOL) injection 60 mg - Ambulatory referral to Physical Medicine Rehab  2. Chronic bilateral low back pain without sciatica See above medical treatment plan. - diclofenac Sodium (VOLTAREN) 1 % GEL; Apply 2 g topically 4 (four) times daily.  Dispense: 350 g; Refill: 0 - DG Lumbar Spine Complete; Future - ketorolac (TORADOL) injection 60 mg - Ambulatory referral to Physical Medicine Rehab   No follow-ups on file.      Reynolds Bowl, PA-C, have reviewed all documentation for this visit. The documentation on 02/22/20 for the  exam, diagnosis, procedures, and orders are all accurate and complete.   Rubye Beach  [facility] [phone] (phone) [phone] (fax)  [place]

## 2020-02-22 ENCOUNTER — Other Ambulatory Visit: Payer: Self-pay | Admitting: Physician Assistant

## 2020-02-22 ENCOUNTER — Ambulatory Visit
Admission: RE | Admit: 2020-02-22 | Discharge: 2020-02-22 | Disposition: A | Discharge status: Home / Self Care | Payer: Medicaid Other | Source: Ambulatory Visit | Attending: Physician Assistant | Admitting: Physician Assistant

## 2020-02-22 ENCOUNTER — Ambulatory Visit
Admission: RE | Admit: 2020-02-22 | Discharge: 2020-02-22 | Disposition: A | Discharge status: Home / Self Care | Payer: Medicaid Other | Attending: Physician Assistant | Admitting: Physician Assistant

## 2020-02-22 ENCOUNTER — Ambulatory Visit: Payer: Medicaid Other | Admitting: Physician Assistant

## 2020-02-22 ENCOUNTER — Telehealth: Payer: Self-pay

## 2020-02-22 ENCOUNTER — Encounter: Payer: Self-pay | Admitting: Physician Assistant

## 2020-02-22 ENCOUNTER — Other Ambulatory Visit: Payer: Self-pay

## 2020-02-22 VITALS — BP 105/71 | HR 114 | Temp 96.8°F | Wt 190.2 lb

## 2020-02-22 DIAGNOSIS — M5126 Other intervertebral disc displacement, lumbar region: Secondary | ICD-10-CM

## 2020-02-22 DIAGNOSIS — M545 Low back pain, unspecified: Secondary | ICD-10-CM

## 2020-02-22 DIAGNOSIS — K5903 Drug induced constipation: Secondary | ICD-10-CM

## 2020-02-22 DIAGNOSIS — G8929 Other chronic pain: Secondary | ICD-10-CM

## 2020-02-22 DIAGNOSIS — M5441 Lumbago with sciatica, right side: Secondary | ICD-10-CM

## 2020-02-22 DIAGNOSIS — M5442 Lumbago with sciatica, left side: Secondary | ICD-10-CM

## 2020-02-22 MED ORDER — KETOROLAC TROMETHAMINE 60 MG/2ML IM SOLN
60.0000 mg | Freq: Once | INTRAMUSCULAR | Status: AC
  Administered 2020-02-22: 60 mg via INTRAMUSCULAR

## 2020-02-22 MED ORDER — DICLOFENAC SODIUM 1 % EX GEL: 2.0000 g | Freq: Four times a day (QID) | CUTANEOUS | 0 refills | Status: DC

## 2020-02-22 NOTE — Telephone Encounter (Signed)
-----   Message from Mar Daring, [place] sent at 02/22/2020  4:10 PM EDT ----- Lumbar xray does show that there is some mild shifting of the L2/L3 vertebrae. Also noted to have moderate stool burden indicating constipation. May benefit from stool softener and miralax. Will see if lumbar MRI can be covered.

## 2020-02-22 NOTE — Patient Instructions (Signed)
Herniated Disk Rehab Ask your health care provider which exercises are safe for you. Do exercises exactly as told by your health care provider and adjust them as directed. It is normal to feel mild stretching, pulling, tightness, or discomfort as you do these exercises. Stop right away if you feel sudden pain or your pain gets worse. Do not begin these exercises until told by your health care provider. Stretching and range-of-motion exercises These exercises warm up your muscles and joints and improve the movement and flexibility of your back. These exercises also help to relieve pain, numbness, and tingling. Prone extension on elbows  1. Lie on your abdomen on a firm surface (prone position). 2. Prop yourself up on your elbows. 3. Use your arms to help lift your chest up until you feel a gentle stretch in your abdomen and your lower back. ? This will place some of your body weight on your elbows. If this is uncomfortable, try stacking pillows under your chest. ? Your hips should stay down, against the surface that you are lying on. Keep your hip and back muscles relaxed. 4. Hold this position for __________ seconds. 5. Slowly relax your upper body and return to the starting position. Repeat __________ times. Complete this exercise __________ times a day. Standing extension 1. Stand with your feet shoulder-width apart. 2. Place your hands on your lower back, with your palms on your back. 3. Gently arch your back (extension) and press forward with your hands. Keep your hips over your toes. Do not let your hips drift forward while arching your back. 4. Hold this position for __________ seconds. 5. Slowly return to the starting position. Repeat __________ times. Complete this exercise __________ times a day. Strengthening exercises These exercises build strength and endurance in your back. Endurance is the ability to use your muscles for a long time, even after they get tired. Pelvic  tilt 1. Lie on your back on a firm surface. Bend your knees and keep your feet flat. 2. Tense your abdominal muscles. Tip your pelvis up toward the ceiling and flatten your lower back into the floor. ? To help with this exercise, you may place a small towel under your lower back and try to push your back into the towel. 3. Hold this position for __________ seconds. 4. Let your muscles relax completely before you repeat this exercise. Repeat __________ times. Complete this exercise __________ times a day. Alternating arm and leg raises  1. Get on your hands and knees on a firm surface. If you are on a hard floor, you may want to use padding, such as an exercise mat, to cushion your knees. 2. Line up your arms and legs. Your hands should be below your shoulders, and your knees should be below your hips. 3. Lift your left leg behind you. At the same time, raise your right arm and straighten it in front of you. ? Do not lift your leg higher than your hip. ? Do not lift your arm higher than your shoulder. ? Keep your abdominal and back muscles tight. ? Keep your hips facing the ground. ? Do not arch your back. ? Keep your balance carefully, and do not hold your breath. 4. Hold this position for __________ seconds. 5. Slowly return to the starting position and repeat with your right leg and your left arm. Repeat __________ times. Complete this exercise __________ times a day. Bridge  1. Lie on your back on a firm surface with your knees bent  and your feet flat on the floor. 2. Tighten your abdominal and buttocks muscles and lift your bottom off the floor until your trunk and hips are level with your thighs. ? You should feel the muscles working in your buttocks and the back of your thighs. ? Do not arch your back. ? If this exercise is too easy, try doing it with your arms crossed over your chest. 3. Hold this position for __________ seconds. 4. Slowly lower your hips to the starting  position. 5. Let your muscles relax completely after each repetition. Repeat __________ times. Complete this exercise __________ times a day. This information is not intended to replace advice given to you by your health care provider. Make sure you discuss any questions you have with your health care provider. Document Revised: 01/26/2019 Document Reviewed: 08/03/2018 Elsevier Patient Education  [place].

## 2020-02-22 NOTE — Telephone Encounter (Signed)
Attempted to contact patient, no answer left a voicemail. Okay for [facility] to advise patient.  

## 2020-02-23 MED ORDER — METHYLPREDNISOLONE 4 MG PO TBPK: ORAL_TABLET | ORAL | 0 refills | Status: DC

## 2020-02-23 MED ORDER — LINACLOTIDE 145 MCG PO CAPS: 145.0000 ug | ORAL_CAPSULE | Freq: Every day | ORAL | 1 refills | Status: DC

## 2020-02-23 NOTE — Addendum Note (Signed)
Addended by: Mar Daring on: 02/23/2020 05:05 PM   Modules accepted: Orders

## 2020-02-23 NOTE — Telephone Encounter (Signed)
Results and recommendations viewed in [place] on 02/23/20.

## 2020-02-23 NOTE — Telephone Encounter (Signed)
Patient returned call and was informed of results and recommendations per notes of Jennifer,PA. Pt states that she has been taking stool softeners but they have not worked. Pt states she was seen by a GI doctor previously and was prescribed Linzess which really helped.Pt states she was prescribed this medication because she was taking a lot of narcotics. Patient wants to know if she can be prescribed this medication. Pt states she has not been seen by GI doctor in 1-2 years. Pt also asking if she could be given a steroid injection. Pt states she is still experiencing a lot of pain even after receiving the Toradol injection on yesterday.Pt would like to know if it would be safe to come back in to receive a steroid injection and how often she would be able to receive it. Pt states she had an appt to be fitted for a back brace today. Pt would like to know how long it usually takes to see if the MRI is going to be covered.

## 2020-02-23 NOTE — Addendum Note (Signed)
Addended by: Mar Daring on: 02/23/2020 07:31 PM   Modules accepted: Orders

## 2020-02-23 NOTE — Telephone Encounter (Signed)
Patient advised as directed below. She does want to try the methylprenisolone send to [facility] in [place]

## 2020-02-23 NOTE — Telephone Encounter (Signed)
She has an allergy to prednisone listed with SOB so not sure if a steroid is safe for her.  If she has ever taken methylprednisolone or been given a shot of this I could send in but she would have to watch for the SOB and stop immediately if occurred.  I will send in [place].   Insurance can take a week or longer sometimes for approval.

## 2020-03-05 ENCOUNTER — Telehealth: Payer: Self-pay

## 2020-03-05 NOTE — Telephone Encounter (Signed)
Patient states the PA she was seeing at the pain clinic left the practice. The PA sent patient's Oxycodone RX to the pharmacy last month. The PA also sent RX for May. Patient ran out of medicastion early. She is requesting that Dr. Caryn Section authorize the pharmacy to fill RX early. Please review.

## 2020-03-05 NOTE — Telephone Encounter (Signed)
Copied from [place] [id number]. Topic: General - Other >> Mar 05, 2020 11:02 AM Yvette Rack wrote: Reason for CRM: Pt stated her doctor at the pain clinic is no longer there however a Rx for her pain medication was sent to the pharmacy but they will not allow her to get it due to it being 3 days early. Pt requests call back to discuss approval for pain medication.

## 2020-03-05 NOTE — Telephone Encounter (Signed)
Patient advised.

## 2020-03-05 NOTE — Telephone Encounter (Signed)
No, it's not legal to approve refills written from another practice, and we do not approve early refills of opioids even if the prescription was written here.

## 2020-03-08 ENCOUNTER — Other Ambulatory Visit: Payer: Self-pay | Admitting: Family Medicine

## 2020-03-08 DIAGNOSIS — F411 Generalized anxiety disorder: Secondary | ICD-10-CM

## 2020-03-11 ENCOUNTER — Other Ambulatory Visit: Payer: Self-pay

## 2020-03-11 ENCOUNTER — Ambulatory Visit
Admission: RE | Admit: 2020-03-11 | Discharge: 2020-03-11 | Disposition: A | Discharge status: Home / Self Care | Payer: Medicaid Other | Source: Ambulatory Visit | Attending: Physician Assistant | Admitting: Physician Assistant

## 2020-03-11 DIAGNOSIS — G8929 Other chronic pain: Secondary | ICD-10-CM | POA: Insufficient documentation

## 2020-03-11 DIAGNOSIS — M5442 Lumbago with sciatica, left side: Secondary | ICD-10-CM | POA: Diagnosis not present

## 2020-03-11 DIAGNOSIS — M545 Low back pain: Secondary | ICD-10-CM | POA: Diagnosis not present

## 2020-03-11 DIAGNOSIS — M5441 Lumbago with sciatica, right side: Secondary | ICD-10-CM | POA: Insufficient documentation

## 2020-03-11 DIAGNOSIS — M5126 Other intervertebral disc displacement, lumbar region: Secondary | ICD-10-CM | POA: Diagnosis not present

## 2020-03-12 ENCOUNTER — Telehealth: Payer: Self-pay

## 2020-03-12 DIAGNOSIS — M5126 Other intervertebral disc displacement, lumbar region: Secondary | ICD-10-CM

## 2020-03-12 DIAGNOSIS — M5410 Radiculopathy, site unspecified: Secondary | ICD-10-CM

## 2020-03-12 DIAGNOSIS — M5136 Other intervertebral disc degeneration, lumbar region: Secondary | ICD-10-CM

## 2020-03-12 NOTE — Telephone Encounter (Signed)
-----   Message from Mar Daring, [place] sent at 03/12/2020 11:23 AM EDT ----- Disc bulge at L3-L4 has increased in size. Would recommend neurosurgery referral

## 2020-03-12 NOTE — Telephone Encounter (Signed)
LMTCB-if patient calls back ok for [facility] nurse to give results °

## 2020-03-13 NOTE — Addendum Note (Signed)
Addended by: Birdie Sons on: 03/13/2020 04:09 PM   Modules accepted: Orders

## 2020-03-13 NOTE — Telephone Encounter (Signed)
Left message for patient to return call for results.

## 2020-03-13 NOTE — Telephone Encounter (Signed)
Pt given result per Dr Caryn Section, she would like to have referral with Neurosurgeon; the pt states she can be contacted at [phone].

## 2020-03-26 DIAGNOSIS — M5126 Other intervertebral disc displacement, lumbar region: Secondary | ICD-10-CM | POA: Diagnosis not present

## 2020-04-03 ENCOUNTER — Telehealth: Payer: Self-pay

## 2020-04-03 NOTE — Telephone Encounter (Signed)
Copied from [place]  [id number]. Topic: General - Other >> Apr 03, 2020  3:25 PM Hinda Lenis D wrote: Reason for CRM: PT when to her appt at the Pain Clinic, they relocate she needs to speak with a nurse / please advise

## 2020-04-04 NOTE — [place] Notes (Signed)
Established patient visit   Patient: Martha Guerra   DOB: 1981/09/02   39 y.o. Female  MRN: [id number] Visit Date: 04/05/2020  Today's healthcare provider: Lelon Huh, MD   Chief Complaint  Patient presents with  . Pain Management   Subjective    HPI Chronic pain: Patient is here today to discuss chronic pain management. Her former Chronic pain management provider at [facility] in [place] has left the clinic. She is on current oxycodone regiment for interstitial cystitis pain which is followed by Dr. Amalia Hailey and chronic low back pain secondary to DDD and L3-L4 disc protrusion that was seen to be displacing left L4 nerve root on MRI. She was referred to Dr. Sharlet Salina, but states she did not go since she had already been getting injections at pain clinic in [place]. Referral to neurosurgery was placed, but patient states she never received phone call.      Medications: Outpatient Medications Prior to Visit  Medication Sig  . alprazolam (XANAX) 2 MG tablet TAKE 1/2 TABLET BY MOUTH 4 TIMES DAILY MAX 2 TABLETS PER DAY  . amphetamine-dextroamphetamine (ADDERALL) 10 MG tablet Take by mouth.  . lamoTRIgine (LAMICTAL) 100 MG tablet   . lidocaine (XYLOCAINE) 2 % jelly 1 application as needed.  . lidocaine (XYLOCAINE) 5 % ointment Apply 1-2 x a day as directed  . linaclotide (LINZESS) 145 MCG CAPS capsule Take 1 capsule (145 mcg total) by mouth daily before breakfast.  . omeprazole (PRILOSEC) 40 MG capsule TAKE 1 CAPSULE BY MOUTH ONCE DAILY  . oxyCODONE (ROXICODONE) 15 MG immediate release tablet Take 1 tablet by mouth 4 (four) times daily.  . TRINTELLIX 20 MG TABS   . [DISCONTINUED] diclofenac Sodium (VOLTAREN) 1 % GEL Apply 2 g topically 4 (four) times daily.  . [DISCONTINUED] methylPREDNISolone (MEDROL) 4 MG TBPK tablet 6 day taper; take as directed on package instructions  . [DISCONTINUED] pentosan polysulfate (ELMIRON) 100 MG capsule Take 100 mg by  mouth 2 (two) times daily.  . [DISCONTINUED] albuterol (VENTOLIN HFA) 108 (90 Base) MCG/ACT inhaler Inhale 1-2 puffs into the lungs every 6 (six) hours as needed for shortness of breath.   No facility-administered medications prior to visit.    Review of Systems  Constitutional: Negative for appetite change, chills, fatigue and fever.  Respiratory: Negative for chest tightness and shortness of breath.   Cardiovascular: Negative for chest pain and palpitations.  Gastrointestinal: Negative for abdominal pain, nausea and vomiting.  Neurological: Negative for dizziness and weakness.      Objective    BP 127/82 (BP Location: Right Arm, Patient Position: Sitting, Cuff Size: Normal)   Pulse (!) 111   Temp (!) 97.5 F (36.4 C) (Temporal)   Wt 188 lb 9.6 oz (85.5 kg)   BMI 31.38 kg/m    Physical Exam  General appearance: Obese female, cooperative and in no acute distress Head: Normocephalic, without obvious abnormality, atraumatic Respiratory: Respirations even and unlabored, normal respiratory rate Extremities: All extremities are intact.  Skin: Skin color, texture, turgor normal. No rashes seen  Psych: Appropriate mood and affect. Neurologic: Mental status: Alert, oriented to person, place, and time, thought content appropriate.   No results found for any visits on 04/05/20.  Assessment & Plan     1. Chronic bilateral low back pain with bilateral sciatica (Primary Area of Pain) (L>R) Had been on 15mg  five times daily for years managed at pain clinic in [place]. Counseled regarding 100 MME daily limit, so will  need to reduce to- oxyCODONE (ROXICODONE) 15 MG immediate release tablet; Take 1 tablet (15 mg total) by mouth in the morning, at noon, in the evening, and at bedtime.  Dispense: 120 tablet; Refill: 0 - [facility] ([facility]), Ur  She is not sure if pain clinic is going to reopen, apparently they no longer have full time physician. Discussed referral to another pain  clinic, possible [facility]. She will get back to our office about this.   2. Lumbar herniated disc Refer Physical therapy  3. Chronic, continuous use of opioids  - [facility] ([facility]), Ur  4.  Interstitial cystitis (chronic) without hematuria Continue routine follow up Dr. Amalia Hailey.    No follow-ups on file.     The entirety of the information documented in the History of Present Illness, Review of Systems and Physical Exam were personally obtained by me. Portions of this information were initially documented by the CMA and reviewed by me for thoroughness and accuracy.      Lelon Huh, MD  [facility] [phone] (phone) [phone] (fax)  [place]

## 2020-04-05 ENCOUNTER — Other Ambulatory Visit: Payer: Self-pay

## 2020-04-05 ENCOUNTER — Ambulatory Visit (INDEPENDENT_AMBULATORY_CARE_PROVIDER_SITE_OTHER): Payer: Medicaid Other | Admitting: Family Medicine

## 2020-04-05 ENCOUNTER — Encounter: Payer: Self-pay | Admitting: Family Medicine

## 2020-04-05 VITALS — BP 127/82 | HR 111 | Temp 97.5°F | Wt 188.6 lb

## 2020-04-05 DIAGNOSIS — Z79891 Long term (current) use of opiate analgesic: Secondary | ICD-10-CM

## 2020-04-05 DIAGNOSIS — M5126 Other intervertebral disc displacement, lumbar region: Secondary | ICD-10-CM | POA: Diagnosis not present

## 2020-04-05 DIAGNOSIS — M5442 Lumbago with sciatica, left side: Secondary | ICD-10-CM

## 2020-04-05 DIAGNOSIS — N3010 Interstitial cystitis (chronic) without hematuria: Secondary | ICD-10-CM | POA: Diagnosis not present

## 2020-04-05 DIAGNOSIS — G8929 Other chronic pain: Secondary | ICD-10-CM | POA: Diagnosis not present

## 2020-04-05 DIAGNOSIS — M5441 Lumbago with sciatica, right side: Secondary | ICD-10-CM | POA: Diagnosis not present

## 2020-04-05 MED ORDER — OXYCODONE HCL 15 MG PO TABS: 15.0000 mg | ORAL_TABLET | Freq: Four times a day (QID) | ORAL | 0 refills | Status: DC

## 2020-04-05 NOTE — Patient Instructions (Signed)
.   Please review the attached list of medications and notify my office if there are any errors.   . Please bring all of your medications to every appointment so we can make sure that our medication list is the same as yours.   

## 2020-04-07 LAB — MED LIST OPTION NOT SELECTED

## 2020-04-08 LAB — PAIN MGT SCRN (14 DRUGS), UR
Amphetamine Scrn, Ur: NEGATIVE ng/mL
BARBITURATE SCREEN URINE: NEGATIVE ng/mL
BENZODIAZEPINE SCREEN, URINE: NEGATIVE ng/mL
Buprenorphine, Urine: NEGATIVE ng/mL
CANNABINOIDS UR QL SCN: NEGATIVE ng/mL
Cocaine (Metab) Scrn, Ur: NEGATIVE ng/mL
Creatinine(Crt), U: 169.6 mg/dL (ref 20.0–300.0)
Fentanyl, Urine: NEGATIVE pg/mL
Meperidine Screen, Urine: NEGATIVE ng/mL
Methadone Screen, Urine: NEGATIVE ng/mL
OXYCODONE+OXYMORPHONE UR QL SCN: POSITIVE ng/mL — AB
Opiate Scrn, Ur: POSITIVE ng/mL — AB
Ph of Urine: 5.5 (ref 4.5–8.9)
Phencyclidine Qn, Ur: NEGATIVE ng/mL
Propoxyphene Scrn, Ur: NEGATIVE ng/mL
Tramadol Screen, Urine: NEGATIVE ng/mL

## 2020-04-10 ENCOUNTER — Telehealth: Payer: Self-pay

## 2020-04-10 NOTE — Telephone Encounter (Signed)
Copied from [place] [phone]. Topic: General - Other >> Apr 09, 2020  4:26 PM Leward Quan A wrote: Reason for CRM: Patient called to inform Dr Caryn Section that the Neuro surgeon that she was referred to called and informed her that they do not take her insurance the [facility]. She is asking for a different referral please she can be reached at Ph# [phone]

## 2020-04-11 DIAGNOSIS — H5213 Myopia, bilateral: Secondary | ICD-10-CM | POA: Diagnosis not present

## 2020-04-15 ENCOUNTER — Ambulatory Visit: Payer: Medicaid Other | Admitting: Physical Therapy

## 2020-04-24 ENCOUNTER — Encounter: Payer: Medicaid Other | Admitting: Physical Therapy

## 2020-04-25 ENCOUNTER — Ambulatory Visit: Payer: Medicaid Other | Attending: Family Medicine | Admitting: Physical Therapy

## 2020-04-25 DIAGNOSIS — R293 Abnormal posture: Secondary | ICD-10-CM | POA: Insufficient documentation

## 2020-04-25 DIAGNOSIS — G8929 Other chronic pain: Secondary | ICD-10-CM | POA: Insufficient documentation

## 2020-04-25 DIAGNOSIS — M6281 Muscle weakness (generalized): Secondary | ICD-10-CM | POA: Insufficient documentation

## 2020-04-25 DIAGNOSIS — M545 Low back pain: Secondary | ICD-10-CM | POA: Insufficient documentation

## 2020-04-29 ENCOUNTER — Ambulatory Visit: Payer: Medicaid Other | Admitting: Physical Therapy

## 2020-05-07 ENCOUNTER — Telehealth: Payer: Self-pay

## 2020-05-07 DIAGNOSIS — M5126 Other intervertebral disc displacement, lumbar region: Secondary | ICD-10-CM

## 2020-05-07 DIAGNOSIS — N3010 Interstitial cystitis (chronic) without hematuria: Secondary | ICD-10-CM

## 2020-05-07 DIAGNOSIS — G8929 Other chronic pain: Secondary | ICD-10-CM

## 2020-05-07 NOTE — Telephone Encounter (Signed)
Copied from [place] [phone]. Topic: General - Other >> May 07, 2020  4:37 PM Greggory Keen D wrote: Reason for CRM: Pt called saying Dr. Caryn Section was going to send her to a pain clinic per her last visit and she has not heard anything back about an appt or referral.  CB# [phone]

## 2020-05-08 NOTE — Telephone Encounter (Signed)
Order placed

## 2020-05-09 ENCOUNTER — Encounter: Payer: Self-pay | Admitting: Physical Therapy

## 2020-05-09 ENCOUNTER — Ambulatory Visit: Payer: Medicaid Other | Admitting: Physical Therapy

## 2020-05-09 ENCOUNTER — Other Ambulatory Visit: Payer: Self-pay

## 2020-05-09 DIAGNOSIS — G8929 Other chronic pain: Secondary | ICD-10-CM

## 2020-05-09 DIAGNOSIS — M6281 Muscle weakness (generalized): Secondary | ICD-10-CM

## 2020-05-09 DIAGNOSIS — R293 Abnormal posture: Secondary | ICD-10-CM | POA: Diagnosis not present

## 2020-05-09 DIAGNOSIS — M545 Low back pain, unspecified: Secondary | ICD-10-CM

## 2020-05-09 NOTE — Therapy (Signed)
[place] [facility] [address]. [place], [place], [address] Phone: [phone]   Fax:  [phone]  Physical Therapy Evaluation  Patient Details  Name: Martha Guerra MRN: [id number] Date of Birth: [birth year] Referring Provider (PT): Lelon Huh   Encounter Date: 05/09/2020   PT End of Session - 05/09/20 1416    Visit Number 1    Number of Visits 4    Date for PT Re-Evaluation 06/06/20    Authorization Type IE 05/09/2020    PT Start Time 1410    PT Stop Time 1455    PT Time Calculation (min) 45 min    Activity Tolerance Patient tolerated treatment well    Behavior During Therapy [facility] for tasks assessed/performed           Past Medical History:  Diagnosis Date  . Anxiety   . Asthma   . Chronic constipation   . Cystitides, interstitial, chronic   . Depression   . Gastritis   . GERD (gastroesophageal reflux disease)   . History of kidney stones   . History of ovarian cyst     Past Surgical History:  Procedure Laterality Date  . cystectomy    . OVARIAN CYST SURGERY      There were no vitals filed for this visit.        [facility] PT Assessment - 05/09/20 0001      Assessment   Medical Diagnosis chronic LBP    Referring Provider (PT) Lelon Huh    Prior Therapy None      Balance Screen   Has the patient fallen in the past 6 months No           SUBJECTIVE Chief complaint:  Patient notes that she has fallen and cracked her tailbone twice (can't remember how long ago) and is unsure of it's impact on her disc issues. Patient notes that her current back pain started 5 years ago and then was exacerbated in the past 3 months. She states she was almost completely paralyzed; she could not feel her legs or move at that time. She was placed on a steroid and given a back brace which helped. She notes over a few weeks it progressed quite well. Patient adds that she is numb in B anterior thighs; she is unable to stand to  wash dishes because she is having B foot pain. Patient states that she can feel her discs bulging and is worried about being paralyzed because her uncle was paralyzed from ruptured discs that he acquired when standing and cheering for a sporting event in his living room. "I'm scared to do anything and my uncle told me to never let anyone touch my back or I might end up paralyzed too."  Pain: 1-2/10 Present (medicated), 1-2/10 Best (medication), 8/10 Worst Aggravating factors: standing/walking; twisting; bending; lifting Easing factors: sitting; medication 24 hour pain behavior: worst in the evening How long can you sit: 1-2 hours How long can you stand: 5-7 minutes How long can you walk: 5 minutes Recent back trauma: No Prior history of back injury or pain: Yes Pain quality: pain quality: sharp, radiating, weak, "something in there just pressing" Radiating pain: Yes  Numbness/Tingling: Yes (L>R, B) Follow-up appointment with MD: No  Imaging: Yes MRI of Spine:  "1. Increased size of small left subarticular disc protrusion at L3-L4 that narrows the left lateral recess and displaces the descending left L4 nerve root. Correlate for left L4 radiculopathy. 2. Unchanged mild lower  lumbar degenerative disc disease without spinal canal or neural foraminal stenosis."  Goals: Be able to stand for longer.  Red flags (bowel/bladder changes, saddle paresthesia, personal history of cancer, chills/fever, night sweats, unrelenting pain, first onset of insidious LBP <20 y/o) Negative    OBJECTIVE  Mental Status Patient is oriented to person, place and time.  Recent memory is intact.  Remote memory is intact.  Attention span and concentration are intact.  Expressive speech is intact.  Patient's fund of knowledge is within normal limits for educational level.  SENSATION: Grossly intact to light touch bilateral LEs as determined by testing dermatomes L2-S2 Proprioception and hot/cold testing  deferred on this date   MUSCULOSKELETAL: Tremor: None Bulk: Normal Tone: Normal No visible step-off along spinal column  Posture Lumbar lordosis: diminished Lumbar lateral shift: negative Lower crossed syndrome (tight hip flexors and erector spinae; weak gluts and abs): negative Slumped posture in sitting and standing.  Gait Stride length diminished, B. Limited/negligible pelvic rotation during gait. Generally stiff and guarded with movement.   Palpation Deferred 2/2 to high irritability and anxiety.   Strength (out of 5) R/L 3*/3+* Hip flexion 5/5 Hip abduction (seated) 5/5 Hip adduction (seated) 5/5 Knee extension 5/5 Knee flexion 5/5 Ankle dorsiflexion 5/5 Ankle plantarflexion (seated)  *Indicates pain   AROM (degrees) Deferred 2/2 to high irritability and anxiety.  R/L (all movements include overpressure unless otherwise stated) Lumbar forward flexion (65):  Lumbar extension (30): Lumbar lateral flexion (25): R:  L:  Thoracic and Lumbar rotation (30 degrees):  R: degrees L: degrees Hip IR (0-45): R: L: Hip ER (0-45): R: L: Hip Flexion (0-125):  Hip Abduction (0-40): R: L: Hip extension (0-15): R: L: *Indicates pain    Passive Accessory Intervertebral Motion (PAIVM) Deferred 2/2 to high irritability and anxiety.   Passive Physiological Intervertebral Motion (PPIVM) Deferred 2/2 to high irritability and anxiety.    SPECIAL TESTS Lumbar Radiculopathy and Discogenic: Slump (SN 83, -LR 0.32): R: Negative L: Negative  Functional Tasks Sit to stand: WFL (hip hinge present with transfer)   ASSESSMENT Patient is a 39 year old presenting to clinic with chief complaints of low back pain with B radiating symptoms and fear of movement. Upon examination, patient demonstrates deficits in BLE hip flexion strength, posture, postural endurance, pain, gait, fear avoidant behaviors as evidenced by 3/5 RLE hip flexion MMT, 3+/5 LLE hip flexion MMT, slumped posture with  diminished lumbar lordosis, inability to stand > 5 minutes, decreased stride length and pelvic rotation during gait, 8/10 worst pain in the past 7 days, stated belief that movement causes pain and damage. Of note, patient's sensation on dermatome testing and weakness on myotomal testing did not present in a pattern consistent with imaging; additionally, patient able to tolerate slump test without provocation. Patient's responses on FOTO outcome measures (40) indicate significant functional limitations/disability/distress. Patient's progress may be limited due to fear of movement, health literacy, presence of comorbidities which contribute to back pain or heightened pain intensity including but not limited to: IC, constipation, anxiety, insomnia, and fibromyalgia; however, patient's willingness to participate in evaluation is advantageous. Patient was able to engage in productive discussion on basics of pain neuroscience education during today's evaluation and responded positively to educational interventions. Patient will benefit from continued skilled therapeutic intervention to address deficits in BLE hip flexion strength, posture, postural endurance, pain, gait, fear avoidant behaviors in order to increase function and improve overall QOL.   Patient educated on prognosis, POC, and provided with HEP  including: pelvic tilts, supine knee to chest,, supine double knee to chest. Patient articulated understanding and returned demonstration. Patient will benefit from further education in order to maximize compliance and understanding for long-term therapeutic gains.         Objective measurements completed on examination: See above findings.        PT Long Term Goals - 05/09/20 1733      PT LONG TERM GOAL #1   Title Patient will be independent with HEP in order to improve strength and decrease back pain in order to improve pain-free function at home and work.    Baseline IE: provided    Time 4     Period Weeks    Status New    Target Date 06/06/20      PT LONG TERM GOAL #2   Title Patient will demonstrate improved function as evidenced by a score of 50 on FOTO measure for full participation in activities at home and in the community.    Baseline IE: 40    Time 4    Period Weeks    Status New    Target Date 06/06/20      PT LONG TERM GOAL #3   Title Patient will decrease worst back pain as reported on NPRS by at least 2 points in order to demonstrate clinically significant reduction in back pain.    Baseline IE: 8/10    Time 4    Period Weeks    Status New    Target Date 06/06/20      PT LONG TERM GOAL #4   Title Patient will increase strength of B hip flexion by at least 1/2 MMT grade in order to demonstrate improvement in strength and function.    Baseline IE: R 3/5, L 3+/5    Time 4    Period Weeks    Status New    Target Date 06/06/20      PT LONG TERM GOAL #5   Title Patient will tolerate standing posture without support/brace for duration > 10 minutes with pain < 5/10 NPRS for improved participation in activities at home and in the community.    Baseline IE: 5 minutes    Time 4    Period Weeks    Status New    Target Date 06/06/20                  Plan - 05/09/20 1552    Clinical Impression Statement Patient is a 39 year old presenting to clinic with chief complaints of low back pain with B radiating symptoms and fear of movement. Upon examination, patient demonstrates deficits in BLE hip flexion strength, posture, postural endurance, pain, gait, fear avoidant behaviors as evidenced by 3/5 RLE hip flexion MMT, 3+/5 LLE hip flexion MMT, slumped posture with diminished lumbar lordosis, inability to stand > 5 minutes, decreased stride length and pelvic rotation during gait, 8/10 worst pain in the past 7 days, stated belief that movement causes pain and damage. Of note, patient's sensation on dermatome testing and weakness on myotomal testing did not present in  a pattern consistent with imaging; additionally, patient able to tolerate slump test without provocation. Patient's responses on FOTO outcome measures (40) indicate significant functional limitations/disability/distress. Patient's progress may be limited due to fear of movement, health literacy, presence of comorbidities which contribute to back pain or heightened pain intensity including but not limited to: IC, constipation, anxiety, insomnia, and fibromyalgia; however, patient's willingness to participate in evaluation is  advantageous. Patient was able to engage in productive discussion on basics of pain neuroscience education during today's evaluation and responded positively to educational interventions. Patient will benefit from continued skilled therapeutic intervention to address deficits in BLE hip flexion strength, posture, postural endurance, pain, gait, fear avoidant behaviors in order to increase function and improve overall QOL.    Personal Factors and Comorbidities Age;Education;Comorbidity 3+;Past/Current Experience;Time since onset of injury/illness/exacerbation;Fitness;Transportation;Finances;Behavior Pattern    Comorbidities anxiety, IC, GERD, insomnia, constipation, fibromyalgia    Examination-Activity Limitations Bathing;Dressing;Sit;Bed Mobility;Sleep;Bend;Lift;Squat;Stairs;Reach Overhead;Stand    Examination-Participation Restrictions Interpersonal Relationship;Personal Finances;Yard Work;Cleaning;Laundry;Meal Prep;Shop;Community Activity    Stability/Clinical Decision Making Evolving/Moderate complexity    Clinical Decision Making Moderate    Rehab Potential Fair    PT Frequency 1x / week    PT Duration 4 weeks    PT Treatment/Interventions ADLs/Self Care Home Management;Aquatic Therapy;Biofeedback;Moist Heat;Electrical Stimulation;Cryotherapy;Therapeutic activities;Functional mobility training;Stair training;Gait training;Therapeutic exercise;Balance training;Neuromuscular  re-education;Taping;Scar mobilization;Splinting;Dry needling;Passive range of motion;Manual techniques;Patient/family education;Orthotic Fit/Training;Joint Manipulations;Spinal Manipulations    PT Next Visit Plan FABQ; Graded exposure to movement; abdominal strengthening    PT Home Exercise Plan Gentle lumbar stretches    Consulted and Agree with Plan of Care Patient           Patient will benefit from skilled therapeutic intervention in order to improve the following deficits and impairments:  Abnormal gait, Decreased balance, Decreased endurance, Decreased mobility, Difficulty walking, Pain, Postural dysfunction, Improper body mechanics, Decreased strength, Decreased coordination, Decreased activity tolerance, Decreased range of motion, Increased muscle spasms, Impaired flexibility, Impaired perceived functional ability  Visit Diagnosis: Chronic low back pain, unspecified back pain laterality, unspecified whether sciatica present  Abnormal posture  Muscle weakness (generalized)     Problem List Patient Active Problem List   Diagnosis Date Noted  . Vitamin D deficiency 12/26/2018  . Chronic bilateral low back pain with bilateral sciatica (Primary Area of Pain) (L>R) 12/20/2018  . Chronic pain of both lower extremities (Secondary Area of Pain) disease LR 12/20/2018  . Chronic neck pain [facility] Area of Pain) (R>L) 12/20/2018  . Chronic female pelvic pain (Fourth Area of Pain) 12/20/2018  . Long term current use of opiate analgesic 12/20/2018  . Long term prescription benzodiazepine use 12/20/2018  . Neck pain 08/30/2018  . Lumbar herniated disc 08/30/2018  . Insomnia 07/15/2015  . H/O urinary stone 07/15/2015  . Gastric catarrh 07/15/2015  . Fibromyalgia 11/13/2014  . Anxiety state 08/07/2014  . GERD (gastroesophageal reflux disease) 08/07/2014  . Compulsive tobacco user syndrome 08/07/2014  . Major depressive disorder, single episode 08/07/2014  . Constipation 08/07/2014   . Tobacco use disorder 08/07/2014  . Chronic obstructive pulmonary disease ([place]) 08/07/2014  . Esophageal reflux 08/07/2014  . Palpitations 06/13/2013  . Hematuria 06/29/2012  . Myalgia and myositis 06/29/2012  . High-tone pelvic floor dysfunction 04/27/2012  . Interstitial cystitis (chronic) without hematuria 08/19/2011  . Dysuria 08/19/2011  . Dyspareunia 08/19/2011  . Nocturia 08/19/2011  . Reduced libido 08/19/2011  . Urinary urgency 08/19/2011   Myles Gip PT, DPT [phone] 05/09/2020, 5:36 PM  [facility] [address] [place], [place], [address] Phone: [phone]   Fax:  [phone]  Name: Martha Guerra MRN: [id number] Date of Birth: [birth year]

## 2020-05-13 ENCOUNTER — Encounter: Payer: Medicaid Other | Admitting: Physical Therapy

## 2020-05-16 ENCOUNTER — Encounter: Payer: Self-pay | Admitting: Physical Therapy

## 2020-05-16 ENCOUNTER — Other Ambulatory Visit: Payer: Self-pay

## 2020-05-16 ENCOUNTER — Ambulatory Visit: Payer: Medicaid Other | Admitting: Physical Therapy

## 2020-05-16 DIAGNOSIS — G8929 Other chronic pain: Secondary | ICD-10-CM

## 2020-05-16 DIAGNOSIS — M6281 Muscle weakness (generalized): Secondary | ICD-10-CM

## 2020-05-16 DIAGNOSIS — M545 Low back pain, unspecified: Secondary | ICD-10-CM

## 2020-05-16 DIAGNOSIS — R293 Abnormal posture: Secondary | ICD-10-CM | POA: Diagnosis not present

## 2020-05-16 NOTE — Therapy (Signed)
[facility] [address] [place], [place], [address] Phone: [phone]   Fax:  [phone]  Physical Therapy Treatment  Patient Details  Name: Martha Guerra MRN: [id number] Date of Birth: 06-06-1981 Referring Provider (PT): Lelon Huh   Encounter Date: 05/16/2020   PT End of Session - 05/16/20 1610    Visit Number 2    Number of Visits 4    Date for PT Re-Evaluation 06/06/20    Authorization Type IE 05/09/2020    PT Start Time 1407    PT Stop Time 1502    PT Time Calculation (min) 55 min    Activity Tolerance Patient tolerated treatment well    Behavior During Therapy [facility] for tasks assessed/performed           Past Medical History:  Diagnosis Date  . Anxiety   . Asthma   . Chronic constipation   . Cystitides, interstitial, chronic   . Depression   . Gastritis   . GERD (gastroesophageal reflux disease)   . History of kidney stones   . History of ovarian cyst     Past Surgical History:  Procedure Laterality Date  . cystectomy    . OVARIAN CYST SURGERY      There were no vitals filed for this visit.   Subjective Assessment - 05/16/20 1609    Subjective Patient notes that she feels nauseous today and didn't sleep at all last night. She states that she took an old zofran to manage the nausea but now feels drowsy as a result.    Currently in Pain? Yes    Pain Score 3     Pain Location Back    Pain Orientation Lower          TREATMENT  Neuromuscular Re-education: Seated diaphragmatic breathing for nervous system down regulation Doorway chest stretch with hip flexor stretch, B, x1 min each for improved spinal mobility and posture Countertop downdog stretch modified with diaphragmatic breathing for improved spinal mobility and posture Countertop down dog stretch modified with pelvic tilts for improved spinal mobility and posture Seated thoracic extension with ball and coordinated breath at multiple  levels Balance disk pelvic tilts and pelvic circles for improved spinal mobility and posture Nu-Step, seat 7, L2, BUE/BLE, x5 min of graded activity for improved endogenous pain relief and graded exposure to movement Gait in hallway, x120 feet with increased stride length and arm swing  Patient educated throughout session on appropriate technique and form using multi-modal cueing, HEP, and activity modification. Patient articulated understanding and returned demonstration.  Patient Response to interventions: Patient denies increased pain. Notes she won't know if we did too much until later.  ASSESSMENT Patient presents to clinic with excellent motivation to participate in therapy. Patient demonstrates deficits in BLE hip flexion strength, posture, postural endurance, pain, gait, fear avoidant behaviors. Patient able to participate in graded exposure to movement for thoracic and lumbar spine without incident during today's session and responded positively to active interventions. Patient will benefit from continued skilled therapeutic intervention to address remaining deficits in BLE hip flexion strength, posture, postural endurance, pain, gait, fear avoidant behaviors in order to increase function, and improve overall QOL.    PT Long Term Goals - 05/09/20 1733      PT LONG TERM GOAL #1   Title Patient will be independent with HEP in order to improve strength and decrease back pain in order to improve pain-free function at home and work.  Baseline IE: provided    Time 4    Period Weeks    Status New    Target Date 06/06/20      PT LONG TERM GOAL #2   Title Patient will demonstrate improved function as evidenced by a score of 50 on FOTO measure for full participation in activities at home and in the community.    Baseline IE: 40    Time 4    Period Weeks    Status New    Target Date 06/06/20      PT LONG TERM GOAL #3   Title Patient will decrease worst back pain as reported on NPRS  by at least 2 points in order to demonstrate clinically significant reduction in back pain.    Baseline IE: 8/10    Time 4    Period Weeks    Status New    Target Date 06/06/20      PT LONG TERM GOAL #4   Title Patient will increase strength of B hip flexion by at least 1/2 MMT grade in order to demonstrate improvement in strength and function.    Baseline IE: R 3/5, L 3+/5    Time 4    Period Weeks    Status New    Target Date 06/06/20      PT LONG TERM GOAL #5   Title Patient will tolerate standing posture without support/brace for duration > 10 minutes with pain < 5/10 NPRS for improved participation in activities at home and in the community.    Baseline IE: 5 minutes    Time 4    Period Weeks    Status New    Target Date 06/06/20                 Plan - 05/16/20 1716    Clinical Impression Statement Patient presents to clinic with excellent motivation to participate in therapy. Patient demonstrates deficits in BLE hip flexion strength, posture, postural endurance, pain, gait, fear avoidant behaviors. Patient able to participate in graded exposure to movement for thoracic and lumbar spine without incident during today's session and responded positively to active interventions. Patient will benefit from continued skilled therapeutic intervention to address remaining deficits in BLE hip flexion strength, posture, postural endurance, pain, gait, fear avoidant behaviors in order to increase function, and improve overall QOL.    Personal Factors and Comorbidities Age;Education;Comorbidity 3+;Past/Current Experience;Time since onset of injury/illness/exacerbation;Fitness;Transportation;Finances;Behavior Pattern    Comorbidities anxiety, IC, GERD, insomnia, constipation, fibromyalgia    Examination-Activity Limitations Bathing;Dressing;Sit;Bed Mobility;Sleep;Bend;Lift;Squat;Stairs;Reach Overhead;Stand    Examination-Participation Restrictions Interpersonal Relationship;Personal  Finances;Yard Work;Cleaning;Laundry;Meal Prep;Shop;Community Activity    Stability/Clinical Decision Making Evolving/Moderate complexity    Rehab Potential Fair    PT Frequency 1x / week    PT Duration 4 weeks    PT Treatment/Interventions ADLs/Self Care Home Management;Aquatic Therapy;Biofeedback;Moist Heat;Electrical Stimulation;Cryotherapy;Therapeutic activities;Functional mobility training;Stair training;Gait training;Therapeutic exercise;Balance training;Neuromuscular re-education;Taping;Scar mobilization;Splinting;Dry needling;Passive range of motion;Manual techniques;Patient/family education;Orthotic Fit/Training;Joint Manipulations;Spinal Manipulations    PT Next Visit Plan FABQ; Graded exposure to movement; abdominal strengthening    PT Home Exercise Plan Gentle lumbar stretches    Consulted and Agree with Plan of Care Patient           Patient will benefit from skilled therapeutic intervention in order to improve the following deficits and impairments:  Abnormal gait, Decreased balance, Decreased endurance, Decreased mobility, Difficulty walking, Pain, Postural dysfunction, Improper body mechanics, Decreased strength, Decreased coordination, Decreased activity tolerance, Decreased range of motion, Increased muscle spasms, Impaired  flexibility, Impaired perceived functional ability  Visit Diagnosis: Chronic low back pain, unspecified back pain laterality, unspecified whether sciatica present  Abnormal posture  Muscle weakness (generalized)     Problem List Patient Active Problem List   Diagnosis Date Noted  . Vitamin D deficiency 12/26/2018  . Chronic bilateral low back pain with bilateral sciatica (Primary Area of Pain) (L>R) 12/20/2018  . Chronic pain of both lower extremities (Secondary Area of Pain) disease LR 12/20/2018  . Chronic neck pain [facility] Area of Pain) (R>L) 12/20/2018  . Chronic female pelvic pain (Fourth Area of Pain) 12/20/2018  . Long term current use  of opiate analgesic 12/20/2018  . Long term prescription benzodiazepine use 12/20/2018  . Neck pain 08/30/2018  . Lumbar herniated disc 08/30/2018  . Insomnia 07/15/2015  . H/O urinary stone 07/15/2015  . Gastric catarrh 07/15/2015  . Fibromyalgia 11/13/2014  . Anxiety state 08/07/2014  . GERD (gastroesophageal reflux disease) 08/07/2014  . Compulsive tobacco user syndrome 08/07/2014  . Major depressive disorder, single episode 08/07/2014  . Constipation 08/07/2014  . Tobacco use disorder 08/07/2014  . Chronic obstructive pulmonary disease ([place]) 08/07/2014  . Esophageal reflux 08/07/2014  . Palpitations 06/13/2013  . Hematuria 06/29/2012  . Myalgia and myositis 06/29/2012  . High-tone pelvic floor dysfunction 04/27/2012  . Interstitial cystitis (chronic) without hematuria 08/19/2011  . Dysuria 08/19/2011  . Dyspareunia 08/19/2011  . Nocturia 08/19/2011  . Reduced libido 08/19/2011  . Urinary urgency 08/19/2011   Myles Gip PT, DPT [phone] 05/16/2020, 5:23 PM  [place] [facility] [address] [place], [place], [address] Phone: [phone]   Fax:  [phone]  Name: Enrique Weiss MRN: [id number] Date of Birth: Aug 31, 1981

## 2020-05-17 ENCOUNTER — Telehealth: Payer: Self-pay | Admitting: Family Medicine

## 2020-05-17 NOTE — Telephone Encounter (Signed)
Patient called to ask the nurse to call her regarding getting some records from a previous pain clinic that has closed down so she can give the records to her new pain clinic.  The clinic said that possibly Dr. Caryn Section would be able to access the records.  Please advise and call patient to discuss at [phone]

## 2020-05-20 ENCOUNTER — Encounter: Payer: Medicaid Other | Admitting: Physical Therapy

## 2020-05-21 NOTE — Telephone Encounter (Signed)
Pt advised that we can only provider our records. Pt requested to let Dr. Caryn Section know she is trying to get in with [facility] pain clinic but they won't take her as a new pt without all records from her past pain clinic and that clinic is no longer in business. TNP

## 2020-05-23 ENCOUNTER — Encounter: Payer: Self-pay | Admitting: Physical Therapy

## 2020-05-23 ENCOUNTER — Encounter: Payer: Self-pay | Admitting: Emergency Medicine

## 2020-05-23 ENCOUNTER — Ambulatory Visit: Payer: Medicaid Other | Attending: Family Medicine | Admitting: Physical Therapy

## 2020-05-23 ENCOUNTER — Other Ambulatory Visit: Payer: Self-pay

## 2020-05-23 DIAGNOSIS — M545 Low back pain, unspecified: Secondary | ICD-10-CM

## 2020-05-23 DIAGNOSIS — R195 Other fecal abnormalities: Secondary | ICD-10-CM | POA: Insufficient documentation

## 2020-05-23 DIAGNOSIS — R1030 Lower abdominal pain, unspecified: Secondary | ICD-10-CM | POA: Diagnosis not present

## 2020-05-23 DIAGNOSIS — Z5321 Procedure and treatment not carried out due to patient leaving prior to being seen by health care provider: Secondary | ICD-10-CM | POA: Insufficient documentation

## 2020-05-23 DIAGNOSIS — M6281 Muscle weakness (generalized): Secondary | ICD-10-CM | POA: Diagnosis not present

## 2020-05-23 DIAGNOSIS — R197 Diarrhea, unspecified: Secondary | ICD-10-CM | POA: Insufficient documentation

## 2020-05-23 DIAGNOSIS — R293 Abnormal posture: Secondary | ICD-10-CM | POA: Diagnosis not present

## 2020-05-23 DIAGNOSIS — G8929 Other chronic pain: Secondary | ICD-10-CM | POA: Insufficient documentation

## 2020-05-23 LAB — CBC
HCT: 40.8 % (ref 36.0–46.0)
Hemoglobin: 14.5 g/dL (ref 12.0–15.0)
MCH: 30.1 pg (ref 26.0–34.0)
MCHC: 35.5 g/dL (ref 30.0–36.0)
MCV: 84.8 fL (ref 80.0–100.0)
Platelets: 282 10*3/uL (ref 150–400)
RBC: 4.81 MIL/uL (ref 3.87–5.11)
RDW: 13.1 % (ref 11.5–15.5)
WBC: 7.6 10*3/uL (ref 4.0–10.5)
nRBC: 0 % (ref 0.0–0.2)

## 2020-05-23 LAB — COMPREHENSIVE METABOLIC PANEL
ALT: 19 U/L (ref 0–44)
AST: 20 U/L (ref 15–41)
Albumin: 4 g/dL (ref 3.5–5.0)
Alkaline Phosphatase: 69 U/L (ref 38–126)
Anion gap: 8 (ref 5–15)
BUN: 12 mg/dL (ref 6–20)
CO2: 24 mmol/L (ref 22–32)
Calcium: 8.8 mg/dL — ABNORMAL LOW (ref 8.9–10.3)
Chloride: 106 mmol/L (ref 98–111)
Creatinine, Ser: 0.73 mg/dL (ref 0.44–1.00)
GFR calc Af Amer: >60 mL/min (ref >60)
GFR calc non Af Amer: >60 mL/min (ref >60)
Glucose, Bld: 108 mg/dL — ABNORMAL HIGH (ref 70–99)
Potassium: 4 mmol/L (ref 3.5–5.1)
Sodium: 138 mmol/L (ref 135–145)
Total Bilirubin: 0.5 mg/dL (ref 0.3–1.2)
Total Protein: 7 g/dL (ref 6.5–8.1)

## 2020-05-23 LAB — LIPASE, BLOOD: Lipase: 27 U/L (ref 11–51)

## 2020-05-23 NOTE — ED Triage Notes (Addendum)
Pt reports sudden onset of diarrhea today with large blood clots. Pt with pictures. Pts son also a pt with the same symptoms. Pt reports smell of stool was metallic. Pt also c/o lower abdominal pain, N/V. Pt with hx/o H Pylori.

## 2020-05-23 NOTE — Therapy (Signed)
[facility] [address] [place], [place], [address] Phone: [phone]   Fax:  [phone]  Physical Therapy Treatment  Patient Details  Name: Martha Guerra MRN: [id number] Date of Birth: 1981-03-24 Referring Provider (PT): Lelon Huh   Encounter Date: 05/23/2020   PT End of Session - 05/23/20 1622    Visit Number 3    Number of Visits 4    Date for PT Re-Evaluation 06/06/20    Authorization Type IE 05/09/2020    PT Start Time 1600    PT Stop Time 1655    PT Time Calculation (min) 55 min    Activity Tolerance Patient tolerated treatment well    Behavior During Therapy [facility] for tasks assessed/performed           Past Medical History:  Diagnosis Date  . Anxiety   . Asthma   . Chronic constipation   . Cystitides, interstitial, chronic   . Depression   . Gastritis   . GERD (gastroesophageal reflux disease)   . History of kidney stones   . History of ovarian cyst     Past Surgical History:  Procedure Laterality Date  . cystectomy    . OVARIAN CYST SURGERY      There were no vitals filed for this visit.   Subjective Assessment - 05/23/20 1606    Subjective Patient notes that she is in an anxiety state because of family stressors. Patient notes that her mind is racing so fast that she can't feel her body. Patient notes that she has been doing her exercises. Patient has kept a journal of her symptoms and was only sore in her legs after the last session. Patient was surprised by a flare up of her IC after last session with burning in her pelvic region.    Currently in Pain? Yes    Pain Score 4     Pain Location Head    Pain Orientation Anterior;Medial    Multiple Pain Sites Yes    Pain Score 2    Pain Location Back    Pain Orientation Lower           TREATMENT  Neuromuscular Re-education: Seated chin tucks for improved postural awareness and graded exposure to spinal movement Seated thoracic extension  with ball and coordinated breath at multiple levels Balance disk pelvic tilts and pelvic circles for improved spinal mobility and posture Doorway chest stretch with hip flexor stretch, B, x1 min each for improved spinal mobility and posture Balance disk pelvic tilts and pelvic circles for improved spinal mobility and posture Gait in hallway, x608 feet with increased stride length and arm swing. VCs for posture throughout  Patient educated throughout session on appropriate technique and form using multi-modal cueing, HEP, and activity modification. Patient articulated understanding and returned demonstration.  Patient Response to interventions: Patient notes feeling good at end of session.  ASSESSMENT Patient presents to clinic with excellent motivation to participate in therapy. Patient demonstrates deficits in BLE hip flexion strength, posture, postural endurance, pain, gait, fear avoidant behaviors. Patient able to tolerate increased walking exposure today with more independent postural awareness during today's session and responded positively to active interventions. Patient will benefit from continued skilled therapeutic intervention to address remaining deficits in BLE hip flexion strength, posture, postural endurance, pain, gait, fear avoidant behaviors in order to increase function, and improve overall QOL.     PT Long Term Goals - 05/09/20 1733      PT LONG  TERM GOAL #1   Title Patient will be independent with HEP in order to improve strength and decrease back pain in order to improve pain-free function at home and work.    Baseline IE: provided    Time 4    Period Weeks    Status New    Target Date 06/06/20      PT LONG TERM GOAL #2   Title Patient will demonstrate improved function as evidenced by a score of 50 on FOTO measure for full participation in activities at home and in the community.    Baseline IE: 40    Time 4    Period Weeks    Status New    Target Date 06/06/20       PT LONG TERM GOAL #3   Title Patient will decrease worst back pain as reported on NPRS by at least 2 points in order to demonstrate clinically significant reduction in back pain.    Baseline IE: 8/10    Time 4    Period Weeks    Status New    Target Date 06/06/20      PT LONG TERM GOAL #4   Title Patient will increase strength of B hip flexion by at least 1/2 MMT grade in order to demonstrate improvement in strength and function.    Baseline IE: R 3/5, L 3+/5    Time 4    Period Weeks    Status New    Target Date 06/06/20      PT LONG TERM GOAL #5   Title Patient will tolerate standing posture without support/brace for duration > 10 minutes with pain < 5/10 NPRS for improved participation in activities at home and in the community.    Baseline IE: 5 minutes    Time 4    Period Weeks    Status New    Target Date 06/06/20                 Plan - 05/23/20 1622    Clinical Impression Statement Patient presents to clinic with excellent motivation to participate in therapy. Patient demonstrates deficits in BLE hip flexion strength, posture, postural endurance, pain, gait, fear avoidant behaviors. Patient able to tolerate increased walking exposure today with more independent postural awareness during today's session and responded positively to active interventions. Patient will benefit from continued skilled therapeutic intervention to address remaining deficits in BLE hip flexion strength, posture, postural endurance, pain, gait, fear avoidant behaviors in order to increase function, and improve overall QOL.    Personal Factors and Comorbidities Age;Education;Comorbidity 3+;Past/Current Experience;Time since onset of injury/illness/exacerbation;Fitness;Transportation;Finances;Behavior Pattern    Comorbidities anxiety, IC, GERD, insomnia, constipation, fibromyalgia    Examination-Activity Limitations Bathing;Dressing;Sit;Bed Mobility;Sleep;Bend;Lift;Squat;Stairs;Reach  Overhead;Stand    Examination-Participation Restrictions Interpersonal Relationship;Personal Finances;Yard Work;Cleaning;Laundry;Meal Prep;Shop;Community Activity    Stability/Clinical Decision Making Evolving/Moderate complexity    Rehab Potential Fair    PT Frequency 1x / week    PT Duration 4 weeks    PT Treatment/Interventions ADLs/Self Care Home Management;Aquatic Therapy;Biofeedback;Moist Heat;Electrical Stimulation;Cryotherapy;Therapeutic activities;Functional mobility training;Stair training;Gait training;Therapeutic exercise;Balance training;Neuromuscular re-education;Taping;Scar mobilization;Splinting;Dry needling;Passive range of motion;Manual techniques;Patient/family education;Orthotic Fit/Training;Joint Manipulations;Spinal Manipulations    PT Next Visit Plan FABQ; Graded exposure to movement; abdominal strengthening    PT Home Exercise Plan Gentle lumbar stretches    Consulted and Agree with Plan of Care Patient           Patient will benefit from skilled therapeutic intervention in order to improve the following deficits and impairments:  Abnormal gait, Decreased balance, Decreased endurance, Decreased mobility, Difficulty walking, Pain, Postural dysfunction, Improper body mechanics, Decreased strength, Decreased coordination, Decreased activity tolerance, Decreased range of motion, Increased muscle spasms, Impaired flexibility, Impaired perceived functional ability  Visit Diagnosis: Chronic low back pain, unspecified back pain laterality, unspecified whether sciatica present  Abnormal posture  Muscle weakness (generalized)     Problem List Patient Active Problem List   Diagnosis Date Noted  . Vitamin D deficiency 12/26/2018  . Chronic bilateral low back pain with bilateral sciatica (Primary Area of Pain) (L>R) 12/20/2018  . Chronic pain of both lower extremities (Secondary Area of Pain) disease LR 12/20/2018  . Chronic neck pain [facility] Area of Pain) (R>L)  12/20/2018  . Chronic female pelvic pain (Fourth Area of Pain) 12/20/2018  . Long term current use of opiate analgesic 12/20/2018  . Long term prescription benzodiazepine use 12/20/2018  . Neck pain 08/30/2018  . Lumbar herniated disc 08/30/2018  . Insomnia 07/15/2015  . H/O urinary stone 07/15/2015  . Gastric catarrh 07/15/2015  . Fibromyalgia 11/13/2014  . Anxiety state 08/07/2014  . GERD (gastroesophageal reflux disease) 08/07/2014  . Compulsive tobacco user syndrome 08/07/2014  . Major depressive disorder, single episode 08/07/2014  . Constipation 08/07/2014  . Tobacco use disorder 08/07/2014  . Chronic obstructive pulmonary disease ([place]) 08/07/2014  . Esophageal reflux 08/07/2014  . Palpitations 06/13/2013  . Hematuria 06/29/2012  . Myalgia and myositis 06/29/2012  . High-tone pelvic floor dysfunction 04/27/2012  . Interstitial cystitis (chronic) without hematuria 08/19/2011  . Dysuria 08/19/2011  . Dyspareunia 08/19/2011  . Nocturia 08/19/2011  . Reduced libido 08/19/2011  . Urinary urgency 08/19/2011   Myles Gip PT, DPT [phone] 05/23/2020, 6:22 PM  [facility] [address] [place], [place], [address] Phone: [phone]   Fax:  [phone]  Name: Martha Guerra MRN: [id number] Date of Birth: 1981-10-05

## 2020-05-24 ENCOUNTER — Emergency Department
Admission: EM | Admit: 2020-05-24 | Discharge: 2020-05-24 | Disposition: A | Discharge status: Home / Self Care | Payer: Medicaid Other | Attending: Emergency Medicine | Admitting: Emergency Medicine

## 2020-05-24 LAB — URINALYSIS, COMPLETE (UACMP) WITH MICROSCOPIC
Bacteria, UA: NONE SEEN
Bilirubin Urine: NEGATIVE
Glucose, UA: NEGATIVE mg/dL
Hgb urine dipstick: NEGATIVE
Ketones, ur: NEGATIVE mg/dL
Leukocytes,Ua: NEGATIVE
Nitrite: NEGATIVE
Protein, ur: NEGATIVE mg/dL
Specific Gravity, Urine: 1.013 (ref 1.005–1.030)
Squamous Epithelial / HPF: NONE SEEN (ref 0–5)
pH: 7 (ref 5.0–8.0)

## 2020-05-24 LAB — POCT PREGNANCY, URINE: Preg Test, Ur: NEGATIVE

## 2020-05-27 ENCOUNTER — Telehealth: Payer: Self-pay | Admitting: *Deleted

## 2020-05-27 NOTE — Telephone Encounter (Signed)
Martha Guerra presented to the ED and left before being seen by the provider on 05/24/20 The patient has been enrolled in an automated general discharge outreach program and 2 attempts to contact the patient will be made to follow up on their ED visit and subsequent needs. The care management team is available to provide assistance to this patient at any time.   [name], RN, BSN, [place] Patient [place] [phone]

## 2020-05-28 ENCOUNTER — Other Ambulatory Visit: Payer: Self-pay | Admitting: Family Medicine

## 2020-05-28 DIAGNOSIS — G8929 Other chronic pain: Secondary | ICD-10-CM

## 2020-05-28 DIAGNOSIS — M5442 Lumbago with sciatica, left side: Secondary | ICD-10-CM

## 2020-05-28 NOTE — Telephone Encounter (Signed)
Requested medication (s) are due for refill today: yes  Requested medication (s) are on the active medication list: yes  Last refill:  04/05/20  Future visit scheduled: no  Notes to clinic:  med not delegated to NT to RF   Requested Prescriptions  Pending Prescriptions Disp Refills   oxyCODONE (ROXICODONE) 15 MG immediate release tablet 120 tablet 0    Sig: Take 1 tablet (15 mg total) by mouth in the morning, at noon, in the evening, and at bedtime.      Not Delegated - Analgesics:  Opioid Agonists Failed - 05/28/2020  4:19 PM      Failed - This refill cannot be delegated      Failed - Urine Drug Screen completed in last 360 days.      Passed - Valid encounter within last 6 months    Recent Outpatient Visits           1 month ago Chronic bilateral low back pain with bilateral sciatica (Primary Area of Pain) (L>R)   [facility] Birdie Sons, MD   3 months ago Lumbar herniated disc   [facility] [place] M, [place]   7 months ago [place], Donald E, MD   7 months ago    [facility] Birdie Sons, MD   1 year ago [place], Donald E, MD

## 2020-05-28 NOTE — Telephone Encounter (Signed)
Medication Refill - Medication: oxycodone   Has the patient contacted their pharmacy? Yes.   Pt is needing to have this refilled due to the new pain clinic not having an appt until the end of the month. Please advise.  (Agent: If no, request that the patient contact the pharmacy for the refill.) (Agent: If yes, when and what did the pharmacy advise?)  Preferred Pharmacy (with phone number or street name):  [facility] [place], [place] [address]  Phone: [phone] Fax: [phone]  Hours: Not open 24 hours    Agent: Please be advised that RX refills may take up to 3 business days. We ask that you follow-up with your pharmacy.\

## 2020-05-29 ENCOUNTER — Ambulatory Visit: Payer: Medicaid Other | Admitting: Physical Therapy

## 2020-05-29 MED ORDER — OXYCODONE HCL 15 MG PO TABS: 15.0000 mg | ORAL_TABLET | Freq: Four times a day (QID) | ORAL | 0 refills | Status: DC

## 2020-06-06 ENCOUNTER — Other Ambulatory Visit: Payer: Self-pay

## 2020-06-06 ENCOUNTER — Ambulatory Visit: Payer: Medicaid Other | Admitting: Physical Therapy

## 2020-06-06 ENCOUNTER — Encounter: Payer: Self-pay | Admitting: Physical Therapy

## 2020-06-06 DIAGNOSIS — M6281 Muscle weakness (generalized): Secondary | ICD-10-CM

## 2020-06-06 DIAGNOSIS — M545 Low back pain, unspecified: Secondary | ICD-10-CM

## 2020-06-06 DIAGNOSIS — R293 Abnormal posture: Secondary | ICD-10-CM | POA: Diagnosis not present

## 2020-06-06 DIAGNOSIS — G8929 Other chronic pain: Secondary | ICD-10-CM

## 2020-06-06 NOTE — Therapy (Signed)
[place] [facility] [address] [place], [place], [address] Phone: [phone]   Fax:  [phone]  Physical Therapy Treatment  Patient Details  Name: Martha Guerra MRN: [id number] Date of Birth: [birth year] Referring Provider (PT): Lelon Huh   Encounter Date: 06/06/2020   PT End of Session - 06/06/20 1604    Visit Number 4    Number of Visits 8    Date for PT Re-Evaluation 07/04/20    Authorization Type IE 05/09/2020    PT Start Time 1600    PT Stop Time 1655    PT Time Calculation (min) 55 min    Activity Tolerance Patient tolerated treatment well    Behavior During Therapy [facility] for tasks assessed/performed           Past Medical History:  Diagnosis Date  . Anxiety   . Asthma   . Chronic constipation   . Cystitides, interstitial, chronic   . Depression   . Gastritis   . GERD (gastroesophageal reflux disease)   . History of kidney stones   . History of ovarian cyst     Past Surgical History:  Procedure Laterality Date  . cystectomy    . OVARIAN CYST SURGERY      There were no vitals filed for this visit.   Subjective Assessment - 06/06/20 1601    Subjective Patient states that after her last session, she had to go to the hospital for her son's bowel changes and rectal bleeding. She notes that she also had the same symptoms after last session. She was not seen 2/2 to the ED wait time, but her symptoms and her son's symptoms resolved the following day. Patient has had a headache since then. She notes that she is feeling pretty bad and feels that she may have a "nerve thing that makes you feel everything and feel it stronger." Patient notes that she stopped all medication in order to be fully aware of her symptoms. She had deep pain and pins and needles throughout her body as a result.    Currently in Pain? Yes    Pain Score 1     Pain Location Generalized         TREATMENT  Neuromuscular  Re-education: Reassessed goals; see below.  Standing chin tucks for improved postural awareness and graded exposure to spinal movement Standing lumbar extension with coordinated breath for improve postural awareness Standing lateral trunk flexion stretch, B, with coordinated breath for improved postural awareness Standing trunk rotation stretch, B, with coordinated breath for improved postural awareness Gait in hallway, x680 feet with increased stride length and arm swing. VCs for posture throughout (3/10 pain) Patient education on habit development around movement "snacks" to improve tolerance to activity, conserve energy, and manage pain.  Patient educated throughout session on appropriate technique and form using multi-modal cueing, HEP, and activity modification. Patient articulated understanding and returned demonstration.  Patient Response to interventions: Patient notes feeling good at end of session.  ASSESSMENT Patient presents to clinic with excellent motivation to participate in therapy. Patient demonstrates deficits in BLE hip flexion strength, posture, postural endurance, pain, gait, fear avoidant behaviors. Patient able to tolerate increased walking exposure today with pain managed during today's session and responded positively to active interventions. Patient has achieved/made significant progress toward goals and will benefit from continued physical therapy to transition to a maintenance plan for independent self-management. Patient's condition has the potential to improve in response to therapy. Maximum improvement is yet  to be obtained. The anticipated improvement is attainable and reasonable in a generally predictable time. Patient will benefit from continued skilled therapeutic intervention to address remaining deficits in BLE hip flexion strength, posture, postural endurance, pain, gait, fear avoidant behaviors in order to increase function, and improve overall QOL.      PT  Long Term Goals - 06/06/20 1605      PT LONG TERM GOAL #1   Title Patient will be independent with HEP in order to improve strength and decrease back pain in order to improve pain-free function at home and work.    Baseline IE: provided; 8/19: 60% consistency    Time 4    Period Weeks    Status On-going    Target Date 07/04/20      PT LONG TERM GOAL #2   Title Patient will demonstrate improved function as evidenced by a score of 50 on FOTO measure for full participation in activities at home and in the community.    Baseline IE: 40; 8/19: 56    Time 4    Period Weeks    Status Achieved      PT LONG TERM GOAL #3   Title Patient will decrease worst back pain as reported on NPRS by at least 2 points in order to demonstrate clinically significant reduction in back pain.    Baseline IE: 8/10; 8/19: 6/10    Time 4    Period Weeks    Status On-going    Target Date 07/04/20      PT LONG TERM GOAL #4   Title Patient will increase strength of B hip flexion by at least 1/2 MMT grade in order to demonstrate improvement in strength and function.    Baseline IE: R 3/5, L 3+/5; 8/19: B 4/5    Time 4    Period Weeks    Status Partially Met    Target Date 07/04/20      PT LONG TERM GOAL #5   Title Patient will tolerate standing posture without support/brace for duration > 10 minutes with pain < 5/10 NPRS for improved participation in activities at home and in the community.    Baseline IE: 5 minutes; 8/19: 10 min walking/standing activity in clinic, 3/10 pain (medicated) after    Time 4    Period Weeks    Status Achieved    Target Date 07/04/20                 Plan - 06/06/20 1756    Clinical Impression Statement Patient presents to clinic with excellent motivation to participate in therapy. Patient demonstrates deficits in BLE hip flexion strength, posture, postural endurance, pain, gait, fear avoidant behaviors. Patient able to tolerate increased walking exposure today with pain  managed during today's session and responded positively to active interventions. Patient has achieved/made significant progress toward goals and will benefit from continued physical therapy to transition to a maintenance plan for independent self-management. Patient's condition has the potential to improve in response to therapy. Maximum improvement is yet to be obtained. The anticipated improvement is attainable and reasonable in a generally predictable time. Patient will benefit from continued skilled therapeutic intervention to address remaining deficits in BLE hip flexion strength, posture, postural endurance, pain, gait, fear avoidant behaviors in order to increase function, and improve overall QOL.    Personal Factors and Comorbidities Age;Education;Comorbidity 3+;Past/Current Experience;Time since onset of injury/illness/exacerbation;Fitness;Transportation;Finances;Behavior Pattern    Comorbidities anxiety, IC, GERD, insomnia, constipation, fibromyalgia    Examination-Activity Limitations  Bathing;Dressing;Sit;Bed Mobility;Sleep;Bend;Lift;Squat;Stairs;Reach Overhead;Stand    Examination-Participation Restrictions Interpersonal Relationship;Personal Finances;Yard Work;Cleaning;Laundry;Meal Prep;Shop;Community Activity    Stability/Clinical Decision Making Evolving/Moderate complexity    Rehab Potential Fair    PT Frequency 1x / week    PT Duration 4 weeks    PT Treatment/Interventions ADLs/Self Care Home Management;Aquatic Therapy;Biofeedback;Moist Heat;Electrical Stimulation;Cryotherapy;Therapeutic activities;Functional mobility training;Stair training;Gait training;Therapeutic exercise;Balance training;Neuromuscular re-education;Taping;Scar mobilization;Splinting;Dry needling;Passive range of motion;Manual techniques;Patient/family education;Orthotic Fit/Training;Joint Manipulations;Spinal Manipulations    PT Next Visit Plan FABQ; Graded exposure to movement; abdominal strengthening    PT Home  Exercise Plan Gentle lumbar stretches    Consulted and Agree with Plan of Care Patient           Patient will benefit from skilled therapeutic intervention in order to improve the following deficits and impairments:  Abnormal gait, Decreased balance, Decreased endurance, Decreased mobility, Difficulty walking, Pain, Postural dysfunction, Improper body mechanics, Decreased strength, Decreased coordination, Decreased activity tolerance, Decreased range of motion, Increased muscle spasms, Impaired flexibility, Impaired perceived functional ability  Visit Diagnosis: Chronic low back pain, unspecified back pain laterality, unspecified whether sciatica present  Abnormal posture  Muscle weakness (generalized)     Problem List Patient Active Problem List   Diagnosis Date Noted  . Vitamin D deficiency 12/26/2018  . Chronic bilateral low back pain with bilateral sciatica (Primary Area of Pain) (L>R) 12/20/2018  . Chronic pain of both lower extremities (Secondary Area of Pain) disease LR 12/20/2018  . Chronic neck pain [facility] Area of Pain) (R>L) 12/20/2018  . Chronic female pelvic pain (Fourth Area of Pain) 12/20/2018  . Long term current use of opiate analgesic 12/20/2018  . Long term prescription benzodiazepine use 12/20/2018  . Neck pain 08/30/2018  . Lumbar herniated disc 08/30/2018  . Insomnia 07/15/2015  . H/O urinary stone 07/15/2015  . Gastric catarrh 07/15/2015  . Fibromyalgia 11/13/2014  . Anxiety state 08/07/2014  . GERD (gastroesophageal reflux disease) 08/07/2014  . Compulsive tobacco user syndrome 08/07/2014  . Major depressive disorder, single episode 08/07/2014  . Constipation 08/07/2014  . Tobacco use disorder 08/07/2014  . Chronic obstructive pulmonary disease ([place]) 08/07/2014  . Esophageal reflux 08/07/2014  . Palpitations 06/13/2013  . Hematuria 06/29/2012  . Myalgia and myositis 06/29/2012  . High-tone pelvic floor dysfunction 04/27/2012  . Interstitial  cystitis (chronic) without hematuria 08/19/2011  . Dysuria 08/19/2011  . Dyspareunia 08/19/2011  . Nocturia 08/19/2011  . Reduced libido 08/19/2011  . Urinary urgency 08/19/2011   Myles Gip PT, DPT [phone]  06/06/2020, 6:03 PM  [place] [facility] [address] [place], [place], [address] Phone: [phone]   Fax:  [phone]  Name: Martha Guerra MRN: [id number] Date of Birth: December 14, 1980

## 2020-06-12 DIAGNOSIS — G8929 Other chronic pain: Secondary | ICD-10-CM | POA: Diagnosis not present

## 2020-06-12 DIAGNOSIS — M5126 Other intervertebral disc displacement, lumbar region: Secondary | ICD-10-CM | POA: Diagnosis not present

## 2020-06-12 DIAGNOSIS — M797 Fibromyalgia: Secondary | ICD-10-CM | POA: Diagnosis not present

## 2020-06-12 DIAGNOSIS — N3010 Interstitial cystitis (chronic) without hematuria: Secondary | ICD-10-CM | POA: Diagnosis not present

## 2020-06-12 DIAGNOSIS — M5030 Other cervical disc degeneration, unspecified cervical region: Secondary | ICD-10-CM | POA: Diagnosis not present

## 2020-06-12 DIAGNOSIS — J449 Chronic obstructive pulmonary disease, unspecified: Secondary | ICD-10-CM | POA: Diagnosis not present

## 2020-06-12 DIAGNOSIS — M47816 Spondylosis without myelopathy or radiculopathy, lumbar region: Secondary | ICD-10-CM | POA: Diagnosis not present

## 2020-06-12 DIAGNOSIS — M542 Cervicalgia: Secondary | ICD-10-CM | POA: Diagnosis not present

## 2020-06-12 DIAGNOSIS — Z79891 Long term (current) use of opiate analgesic: Secondary | ICD-10-CM | POA: Diagnosis not present

## 2020-06-13 ENCOUNTER — Ambulatory Visit: Payer: Medicaid Other | Admitting: Physical Therapy

## 2020-06-13 ENCOUNTER — Other Ambulatory Visit: Payer: Self-pay

## 2020-06-13 ENCOUNTER — Encounter: Payer: Self-pay | Admitting: Physical Therapy

## 2020-06-13 DIAGNOSIS — M545 Low back pain, unspecified: Secondary | ICD-10-CM

## 2020-06-13 DIAGNOSIS — R293 Abnormal posture: Secondary | ICD-10-CM | POA: Diagnosis not present

## 2020-06-13 DIAGNOSIS — M6281 Muscle weakness (generalized): Secondary | ICD-10-CM | POA: Diagnosis not present

## 2020-06-13 DIAGNOSIS — G8929 Other chronic pain: Secondary | ICD-10-CM | POA: Diagnosis not present

## 2020-06-13 NOTE — Therapy (Signed)
[facility] [address] [place], [place], [address] Phone: [phone]   Fax:  [phone]  Physical Therapy Treatment  Patient Details  Name: Martha Guerra MRN: [id number] Date of Birth: [birth year] Referring Provider (PT): Lelon Huh   Encounter Date: 06/13/2020   PT End of Session - 06/13/20 1616    Visit Number 5    Number of Visits 8    Date for PT Re-Evaluation 07/04/20    Authorization Type IE 05/09/2020    PT Start Time 1559    PT Stop Time 1655    PT Time Calculation (min) 56 min    Activity Tolerance Patient tolerated treatment well    Behavior During Therapy [facility] for tasks assessed/performed           Past Medical History:  Diagnosis Date  . Anxiety   . Asthma   . Chronic constipation   . Cystitides, interstitial, chronic   . Depression   . Gastritis   . GERD (gastroesophageal reflux disease)   . History of kidney stones   . History of ovarian cyst     Past Surgical History:  Procedure Laterality Date  . cystectomy    . OVARIAN CYST SURGERY      There were no vitals filed for this visit.   Subjective Assessment - 06/13/20 1600    Subjective Patient reports that she went to the pain clinic yesterday and had injections which have helped tremendously. Patient notes that she even got her hair done to continue with her self-care. Patient also reports that she put up her exercise reminders around the house and she has been able to do those throughout her day. She states, 'I feel much better."    Currently in Pain? Yes    Pain Score 1     Pain Location Back    Pain Orientation Mid;Left           TREATMENT  Neuromuscular Re-education: Standing chin tucks for improved postural awareness and graded exposure to spinal movement Standing lumbar extension with coordinated breath for improve postural awareness Seated thoracic extension over ball for improved postural awareness Body mechanics with box  lift from thigh level surface for improved postural control Standing posture at wall with TCs and VCs for improved postural control/awareness Standing posture with dowel feedback for hip hinge preparation to improve body mechanics for safe lifting Pain neuroscience education provided.  Patient educated throughout session on appropriate technique and form using multi-modal cueing, HEP, and activity modification. Patient articulated understanding and returned demonstration.  Patient Response to interventions: Patient notes feeling good at end of session.  ASSESSMENT Patient presents to clinic with excellent motivation to participate in therapy. Patient demonstrates deficits in BLE hip flexion strength, posture, postural endurance, pain, gait, fear avoidant behaviors. Patient able to perform box lift (<5#) from a thigh level surface with adequate form during today's session and responded positively to active and educational interventions.  Patient will benefit from continued skilled therapeutic intervention to address remaining deficits in BLE hip flexion strength, posture, postural endurance, pain, gait, fear avoidant behaviors in order to increase function, and improve overall QOL.     PT Long Term Goals - 06/06/20 1605      PT LONG TERM GOAL #1   Title Patient will be independent with HEP in order to improve strength and decrease back pain in order to improve pain-free function at home and work.    Baseline IE: provided; 8/19: 60% consistency  Time 4    Period Weeks    Status On-going    Target Date 07/04/20      PT LONG TERM GOAL #2   Title Patient will demonstrate improved function as evidenced by a score of 50 on FOTO measure for full participation in activities at home and in the community.    Baseline IE: 40; 8/19: 56    Time 4    Period Weeks    Status Achieved      PT LONG TERM GOAL #3   Title Patient will decrease worst back pain as reported on NPRS by at least 2 points in  order to demonstrate clinically significant reduction in back pain.    Baseline IE: 8/10; 8/19: 6/10    Time 4    Period Weeks    Status On-going    Target Date 07/04/20      PT LONG TERM GOAL #4   Title Patient will increase strength of B hip flexion by at least 1/2 MMT grade in order to demonstrate improvement in strength and function.    Baseline IE: R 3/5, L 3+/5; 8/19: B 4/5    Time 4    Period Weeks    Status Partially Met    Target Date 07/04/20      PT LONG TERM GOAL #5   Title Patient will tolerate standing posture without support/brace for duration > 10 minutes with pain < 5/10 NPRS for improved participation in activities at home and in the community.    Baseline IE: 5 minutes; 8/19: 10 min walking/standing activity in clinic, 3/10 pain (medicated) after    Time 4    Period Weeks    Status Achieved    Target Date 07/04/20                 Plan - 06/13/20 1617    Clinical Impression Statement Patient presents to clinic with excellent motivation to participate in therapy. Patient demonstrates deficits in BLE hip flexion strength, posture, postural endurance, pain, gait, fear avoidant behaviors. Patient able to perform box lift (<5#) from a thigh level surface with adequate form during today's session and responded positively to active and educational interventions.  Patient will benefit from continued skilled therapeutic intervention to address remaining deficits in BLE hip flexion strength, posture, postural endurance, pain, gait, fear avoidant behaviors in order to increase function, and improve overall QOL.    Personal Factors and Comorbidities Age;Education;Comorbidity 3+;Past/Current Experience;Time since onset of injury/illness/exacerbation;Fitness;Transportation;Finances;Behavior Pattern    Comorbidities anxiety, IC, GERD, insomnia, constipation, fibromyalgia    Examination-Activity Limitations Bathing;Dressing;Sit;Bed Mobility;Sleep;Bend;Lift;Squat;Stairs;Reach  Overhead;Stand    Examination-Participation Restrictions Interpersonal Relationship;Personal Finances;Yard Work;Cleaning;Laundry;Meal Prep;Shop;Community Activity    Stability/Clinical Decision Making Evolving/Moderate complexity    Rehab Potential Fair    PT Frequency 1x / week    PT Duration 4 weeks    PT Treatment/Interventions ADLs/Self Care Home Management;Aquatic Therapy;Biofeedback;Moist Heat;Electrical Stimulation;Cryotherapy;Therapeutic activities;Functional mobility training;Stair training;Gait training;Therapeutic exercise;Balance training;Neuromuscular re-education;Taping;Scar mobilization;Splinting;Dry needling;Passive range of motion;Manual techniques;Patient/family education;Orthotic Fit/Training;Joint Manipulations;Spinal Manipulations    PT Next Visit Plan FABQ; Graded exposure to movement; abdominal strengthening    PT Home Exercise Plan Gentle lumbar stretches    Consulted and Agree with Plan of Care Patient           Patient will benefit from skilled therapeutic intervention in order to improve the following deficits and impairments:  Abnormal gait, Decreased balance, Decreased endurance, Decreased mobility, Difficulty walking, Pain, Postural dysfunction, Improper body mechanics, Decreased strength, Decreased coordination, Decreased activity  tolerance, Decreased range of motion, Increased muscle spasms, Impaired flexibility, Impaired perceived functional ability  Visit Diagnosis: Chronic low back pain, unspecified back pain laterality, unspecified whether sciatica present  Abnormal posture  Muscle weakness (generalized)     Problem List Patient Active Problem List   Diagnosis Date Noted  . Vitamin D deficiency 12/26/2018  . Chronic bilateral low back pain with bilateral sciatica (Primary Area of Pain) (L>R) 12/20/2018  . Chronic pain of both lower extremities (Secondary Area of Pain) disease LR 12/20/2018  . Chronic neck pain [facility] Area of Pain) (R>L)  12/20/2018  . Chronic female pelvic pain (Fourth Area of Pain) 12/20/2018  . Long term current use of opiate analgesic 12/20/2018  . Long term prescription benzodiazepine use 12/20/2018  . Neck pain 08/30/2018  . Lumbar herniated disc 08/30/2018  . Insomnia 07/15/2015  . H/O urinary stone 07/15/2015  . Gastric catarrh 07/15/2015  . Fibromyalgia 11/13/2014  . Anxiety state 08/07/2014  . GERD (gastroesophageal reflux disease) 08/07/2014  . Compulsive tobacco user syndrome 08/07/2014  . Major depressive disorder, single episode 08/07/2014  . Constipation 08/07/2014  . Tobacco use disorder 08/07/2014  . Chronic obstructive pulmonary disease ([place]) 08/07/2014  . Esophageal reflux 08/07/2014  . Palpitations 06/13/2013  . Hematuria 06/29/2012  . Myalgia and myositis 06/29/2012  . High-tone pelvic floor dysfunction 04/27/2012  . Interstitial cystitis (chronic) without hematuria 08/19/2011  . Dysuria 08/19/2011  . Dyspareunia 08/19/2011  . Nocturia 08/19/2011  . Reduced libido 08/19/2011  . Urinary urgency 08/19/2011   Myles Gip PT, DPT [phone]  06/13/2020, 5:05 PM  [facility] [address] [place], [place], [address] Phone: [phone]   Fax:  [phone]  Name: Martha Guerra MRN: [id number] Date of Birth: [birth year]

## 2020-06-18 DIAGNOSIS — F4310 Post-traumatic stress disorder, unspecified: Secondary | ICD-10-CM | POA: Diagnosis not present

## 2020-06-18 DIAGNOSIS — F900 Attention-deficit hyperactivity disorder, predominantly inattentive type: Secondary | ICD-10-CM | POA: Diagnosis not present

## 2020-06-18 DIAGNOSIS — F410 Panic disorder [episodic paroxysmal anxiety] without agoraphobia: Secondary | ICD-10-CM | POA: Diagnosis not present

## 2020-06-20 ENCOUNTER — Encounter: Payer: Self-pay | Admitting: Physical Therapy

## 2020-06-20 ENCOUNTER — Ambulatory Visit: Payer: Medicaid Other | Attending: Family Medicine | Admitting: Physical Therapy

## 2020-06-20 ENCOUNTER — Encounter: Payer: Self-pay | Admitting: Family Medicine

## 2020-06-20 ENCOUNTER — Telehealth: Payer: Self-pay

## 2020-06-20 ENCOUNTER — Other Ambulatory Visit: Payer: Self-pay

## 2020-06-20 DIAGNOSIS — M545 Low back pain, unspecified: Secondary | ICD-10-CM

## 2020-06-20 DIAGNOSIS — G8929 Other chronic pain: Secondary | ICD-10-CM | POA: Diagnosis present

## 2020-06-20 DIAGNOSIS — R293 Abnormal posture: Secondary | ICD-10-CM | POA: Diagnosis present

## 2020-06-20 DIAGNOSIS — M6281 Muscle weakness (generalized): Secondary | ICD-10-CM | POA: Insufficient documentation

## 2020-06-20 NOTE — Therapy (Signed)
[name] [address] [place], [place], [address] Phone: [phone]   Fax:  [phone]  Physical Therapy Treatment  Patient Details  Name: Martha Guerra MRN: [id number] Date of Birth: 1981/01/01 Referring Provider (PT): Lelon Huh   Encounter Date: 06/20/2020   PT End of Session - 06/20/20 1814    Visit Number 6    Number of Visits 8    Date for PT Re-Evaluation 07/04/20    Authorization Type IE 05/09/2020    PT Start Time 1602    PT Stop Time 1658    PT Time Calculation (min) 56 min    Activity Tolerance Patient tolerated treatment well;Patient limited by pain    Behavior During Therapy [facility] for tasks assessed/performed;Anxious           Past Medical History:  Diagnosis Date  . Anxiety   . Asthma   . Chronic constipation   . Cystitides, interstitial, chronic   . Depression   . Gastritis   . GERD (gastroesophageal reflux disease)   . History of kidney stones   . History of ovarian cyst     Past Surgical History:  Procedure Laterality Date  . cystectomy    . OVARIAN CYST SURGERY      There were no vitals filed for this visit.   Subjective Assessment - 06/20/20 1604    Subjective Patient notes she has had a migraine and took some sudafed which is making her feel very jittery today. She notes other than that she has been okay. Patient notes that she ocntinues to work on moving more and that is feeling good. She notes that her next step is going back to urology for some medication management of her IC.    Currently in Pain? Yes    Pain Score 3     Pain Location Head           TREATMENT  Neuromuscular Re-education: Patient participated in pain coping skills activities including:  Supported supine diaphragmatic breathing for nervous system down-training  Guided body scan meditation with progressive relaxation components   Patient educated throughout session on appropriate technique and form using  multi-modal cueing, HEP, and activity modification. Patient articulated understanding and returned demonstration.  Patient Response to interventions: Patient notes 0/10 pain in headache after body scan meditation.  ASSESSMENT Patient presents to clinic with excellent motivation to participate in therapy. Patient demonstrates deficits in BLE hip flexion strength, posture, postural endurance, pain, gait, fear avoidant behaviors. Patient able to significantly decrease pain on NPRS with PCST during today's session and responded positively to active and educational interventions.  Patient will benefit from continued skilled therapeutic intervention to address remaining deficits in BLE hip flexion strength, posture, postural endurance, pain, gait, fear avoidant behaviors in order to increase function, and improve overall QOL.      PT Long Term Goals - 06/06/20 1605      PT LONG TERM GOAL #1   Title Patient will be independent with HEP in order to improve strength and decrease back pain in order to improve pain-free function at home and work.    Baseline IE: provided; 8/19: 60% consistency    Time 4    Period Weeks    Status On-going    Target Date 07/04/20      PT LONG TERM GOAL #2   Title Patient will demonstrate improved function as evidenced by a score of 50 on FOTO measure for full participation in activities at  home and in the community.    Baseline IE: 40; 8/19: 56    Time 4    Period Weeks    Status Achieved      PT LONG TERM GOAL #3   Title Patient will decrease worst back pain as reported on NPRS by at least 2 points in order to demonstrate clinically significant reduction in back pain.    Baseline IE: 8/10; 8/19: 6/10    Time 4    Period Weeks    Status On-going    Target Date 07/04/20      PT LONG TERM GOAL #4   Title Patient will increase strength of B hip flexion by at least 1/2 MMT grade in order to demonstrate improvement in strength and function.    Baseline IE: R 3/5,  L 3+/5; 8/19: B 4/5    Time 4    Period Weeks    Status Partially Met    Target Date 07/04/20      PT LONG TERM GOAL #5   Title Patient will tolerate standing posture without support/brace for duration > 10 minutes with pain < 5/10 NPRS for improved participation in activities at home and in the community.    Baseline IE: 5 minutes; 8/19: 10 min walking/standing activity in clinic, 3/10 pain (medicated) after    Time 4    Period Weeks    Status Achieved    Target Date 07/04/20                 Plan - 06/20/20 1815    Clinical Impression Statement Patient presents to clinic with excellent motivation to participate in therapy. Patient demonstrates deficits in BLE hip flexion strength, posture, postural endurance, pain, gait, fear avoidant behaviors. Patient able to significantly decrease pain on NPRS with PCST during today's session and responded positively to active and educational interventions.  Patient will benefit from continued skilled therapeutic intervention to address remaining deficits in BLE hip flexion strength, posture, postural endurance, pain, gait, fear avoidant behaviors in order to increase function, and improve overall QOL.    Personal Factors and Comorbidities Age;Education;Comorbidity 3+;Past/Current Experience;Time since onset of injury/illness/exacerbation;Fitness;Transportation;Finances;Behavior Pattern    Comorbidities anxiety, IC, GERD, insomnia, constipation, fibromyalgia    Examination-Activity Limitations Bathing;Dressing;Sit;Bed Mobility;Sleep;Bend;Lift;Squat;Stairs;Reach Overhead;Stand    Examination-Participation Restrictions Interpersonal Relationship;Personal Finances;Yard Work;Cleaning;Laundry;Meal Prep;Shop;Community Activity    Stability/Clinical Decision Making Evolving/Moderate complexity    Rehab Potential Fair    PT Frequency 1x / week    PT Duration 4 weeks    PT Treatment/Interventions ADLs/Self Care Home Management;Aquatic  Therapy;Biofeedback;Moist Heat;Electrical Stimulation;Cryotherapy;Therapeutic activities;Functional mobility training;Stair training;Gait training;Therapeutic exercise;Balance training;Neuromuscular re-education;Taping;Scar mobilization;Splinting;Dry needling;Passive range of motion;Manual techniques;Patient/family education;Orthotic Fit/Training;Joint Manipulations;Spinal Manipulations    PT Next Visit Plan FABQ; Graded exposure to movement; abdominal strengthening    PT Home Exercise Plan Gentle lumbar stretches    Consulted and Agree with Plan of Care Patient           Patient will benefit from skilled therapeutic intervention in order to improve the following deficits and impairments:  Abnormal gait, Decreased balance, Decreased endurance, Decreased mobility, Difficulty walking, Pain, Postural dysfunction, Improper body mechanics, Decreased strength, Decreased coordination, Decreased activity tolerance, Decreased range of motion, Increased muscle spasms, Impaired flexibility, Impaired perceived functional ability  Visit Diagnosis: Chronic low back pain, unspecified back pain laterality, unspecified whether sciatica present  Abnormal posture  Muscle weakness (generalized)     Problem List Patient Active Problem List   Diagnosis Date Noted  . Vitamin D deficiency 12/26/2018  .  Chronic bilateral low back pain with bilateral sciatica (Primary Area of Pain) (L>R) 12/20/2018  . Chronic pain of both lower extremities (Secondary Area of Pain) disease LR 12/20/2018  . Chronic neck pain [facility] Area of Pain) (R>L) 12/20/2018  . Chronic female pelvic pain (Fourth Area of Pain) 12/20/2018  . Long term current use of opiate analgesic 12/20/2018  . Long term prescription benzodiazepine use 12/20/2018  . Neck pain 08/30/2018  . Lumbar herniated disc 08/30/2018  . Insomnia 07/15/2015  . H/O urinary stone 07/15/2015  . Gastric catarrh 07/15/2015  . Fibromyalgia 11/13/2014  . Anxiety state  08/07/2014  . GERD (gastroesophageal reflux disease) 08/07/2014  . Compulsive tobacco user syndrome 08/07/2014  . Major depressive disorder, single episode 08/07/2014  . Constipation 08/07/2014  . Tobacco use disorder 08/07/2014  . Chronic obstructive pulmonary disease ([place]) 08/07/2014  . Esophageal reflux 08/07/2014  . Palpitations 06/13/2013  . Hematuria 06/29/2012  . Myalgia and myositis 06/29/2012  . High-tone pelvic floor dysfunction 04/27/2012  . Interstitial cystitis (chronic) without hematuria 08/19/2011  . Dysuria 08/19/2011  . Dyspareunia 08/19/2011  . Nocturia 08/19/2011  . Reduced libido 08/19/2011  . Urinary urgency 08/19/2011   Myles Gip PT, DPT [phone]  06/20/2020, 6:18 PM  [facility] [address] [place], [place], [address] Phone: [phone]   Fax:  [phone]  Name: Ellenor Wisniewski MRN: [id number] Date of Birth: 09-Oct-1981

## 2020-06-20 NOTE — Telephone Encounter (Signed)
Copied from [place] [phone]. Topic: General - Other >> Jun 20, 2020  1:24 PM Rainey Pines A wrote: Patient called to give message to Dr. Sabino Snipes nurse in regards to referral. Patient went to appt and wasn't accepted as a regular patient. Patient is going to look for someone else to go to. Patient was advised that Dr. Caryn Section will have to continue with her treatment until a different facility  is found   Best contact [phone].

## 2020-06-26 ENCOUNTER — Other Ambulatory Visit: Payer: Self-pay | Admitting: Family Medicine

## 2020-06-26 DIAGNOSIS — M5442 Lumbago with sciatica, left side: Secondary | ICD-10-CM

## 2020-06-26 DIAGNOSIS — G8929 Other chronic pain: Secondary | ICD-10-CM

## 2020-06-27 MED ORDER — OXYCODONE HCL 15 MG PO TABS: 15.0000 mg | ORAL_TABLET | Freq: Four times a day (QID) | ORAL | 0 refills | Status: DC

## 2020-07-04 ENCOUNTER — Ambulatory Visit: Payer: Medicaid Other | Admitting: Physical Therapy

## 2020-07-11 ENCOUNTER — Encounter: Payer: Self-pay | Admitting: Physical Therapy

## 2020-07-11 ENCOUNTER — Ambulatory Visit: Payer: Medicaid Other | Admitting: Physical Therapy

## 2020-07-11 ENCOUNTER — Other Ambulatory Visit: Payer: Self-pay

## 2020-07-11 DIAGNOSIS — M6281 Muscle weakness (generalized): Secondary | ICD-10-CM

## 2020-07-11 DIAGNOSIS — M545 Low back pain, unspecified: Secondary | ICD-10-CM

## 2020-07-11 DIAGNOSIS — R293 Abnormal posture: Secondary | ICD-10-CM

## 2020-07-11 NOTE — Therapy (Signed)
[facility] [address] [place], [place], [address] Phone: [phone]   Fax:  [phone]  Physical Therapy Treatment  Patient Details  Name: Martha Guerra MRN: [id number] Date of Birth: 1981-02-21 Referring Provider (PT): Lelon Huh   Encounter Date: 07/11/2020   PT End of Session - 07/11/20 1628    Visit Number 7    Number of Visits 8    Date for PT Re-Evaluation 07/11/20    Authorization Type IE 05/09/2020    PT Start Time 1602    PT Stop Time 1656    PT Time Calculation (min) 54 min    Activity Tolerance Patient tolerated treatment well;Patient limited by pain    Behavior During Therapy [facility] for tasks assessed/performed;Anxious           Past Medical History:  Diagnosis Date  . Anxiety   . Asthma   . Chronic constipation   . Cystitides, interstitial, chronic   . Depression   . Gastritis   . GERD (gastroesophageal reflux disease)   . History of kidney stones   . History of ovarian cyst     Past Surgical History:  Procedure Laterality Date  . cystectomy    . OVARIAN CYST SURGERY      There were no vitals filed for this visit.   Subjective Assessment - 07/11/20 1607    Subjective Patient reports that she missed her last appointment because she had so many birthday visitors she forgot her appointment. Patient notes she is fatigued today from having to drive her son to and from college courses. Patient notes that she had an IC flare over the weekend and is having pelvic pain.    Currently in Pain? No/denies          TREATMENT  Neuromuscular Re-education: Reassessed goals; see below.  Review of pain coping strategies, movement strategies for pain.   Patient educated throughout session on appropriate technique and form using multi-modal cueing, HEP, and activity modification. Patient articulated understanding and returned demonstration.  Patient Response to interventions: Patient notes confidence  to self-manage.  ASSESSMENT Patient presents to clinic with excellent motivation to participate in therapy. Patient demonstrates minimal deficits in BLE hip flexion strength, posture, postural endurance, pain, gait, fear avoidant behaviors. Patient has achieved all goals set forth in therapy and is appropriate for discharge to self-management. Patient responded positively to active and educational interventions throughout her course of therapy and has been able to apply the skills independently at home.      PT Long Term Goals - 07/11/20 1629      PT LONG TERM GOAL #1   Title Patient will be independent with HEP in order to improve strength and decrease back pain in order to improve pain-free function at home and work.    Baseline IE: provided; 8/19: 60% consistency; 9/23: IND    Time 4    Period Weeks    Status Achieved      PT LONG TERM GOAL #2   Title Patient will demonstrate improved function as evidenced by a score of 50 on FOTO measure for full participation in activities at home and in the community.    Baseline IE: 40; 8/19: 56    Time 4    Period Weeks    Status Achieved      PT LONG TERM GOAL #3   Title Patient will decrease worst back pain as reported on NPRS by at least 2 points in order to  demonstrate clinically significant reduction in back pain.    Baseline IE: 8/10; 8/19: 6/10; 9/23: 6/10    Time 4    Period Weeks    Status Achieved      PT LONG TERM GOAL #4   Title Patient will increase strength of B hip flexion by at least 1/2 MMT grade in order to demonstrate improvement in strength and function.    Baseline IE: R 3/5, L 3+/5; 8/19: B 4/5; 9/23: 4/5    Time 4    Period Weeks    Status Achieved      PT LONG TERM GOAL #5   Title Patient will tolerate standing posture without support/brace for duration > 10 minutes with pain < 5/10 NPRS for improved participation in activities at home and in the community.    Baseline IE: 5 minutes; 8/19: 10 min walking/standing  activity in clinic, 3/10 pain (medicated) after    Time 4    Period Weeks    Status Achieved                 Plan - 07/11/20 1709    Clinical Impression Statement Patient presents to clinic with excellent motivation to participate in therapy. Patient demonstrates minimal deficits in BLE hip flexion strength, posture, postural endurance, pain, gait, fear avoidant behaviors. Patient has achieved all goals set forth in therapy and is appropriate for discharge to self-management. Patient responded positively to active and educational interventions throughout her course of therapy and has been able to apply the skills independently at home.    Personal Factors and Comorbidities Age;Education;Comorbidity 3+;Past/Current Experience;Time since onset of injury/illness/exacerbation;Fitness;Transportation;Finances;Behavior Pattern    Comorbidities anxiety, IC, GERD, insomnia, constipation, fibromyalgia    Examination-Activity Limitations Bathing;Dressing;Sit;Bed Mobility;Sleep;Bend;Lift;Squat;Stairs;Reach Overhead;Stand    Examination-Participation Restrictions Interpersonal Relationship;Personal Finances;Yard Work;Cleaning;Laundry;Meal Prep;Shop;Community Activity    Stability/Clinical Decision Making Evolving/Moderate complexity    Rehab Potential Fair    PT Frequency One time visit    PT Treatment/Interventions ADLs/Self Care Home Management;Aquatic Therapy;Biofeedback;Moist Heat;Electrical Stimulation;Cryotherapy;Therapeutic activities;Functional mobility training;Stair training;Gait training;Therapeutic exercise;Balance training;Neuromuscular re-education;Taping;Scar mobilization;Splinting;Dry needling;Passive range of motion;Manual techniques;Patient/family education;Orthotic Fit/Training;Joint Manipulations;Spinal Manipulations    Consulted and Agree with Plan of Care Patient           Patient will benefit from skilled therapeutic intervention in order to improve the following deficits and  impairments:  Abnormal gait, Decreased balance, Decreased endurance, Decreased mobility, Difficulty walking, Pain, Postural dysfunction, Improper body mechanics, Decreased strength, Decreased coordination, Decreased activity tolerance, Decreased range of motion, Increased muscle spasms, Impaired flexibility, Impaired perceived functional ability  Visit Diagnosis: Chronic low back pain, unspecified back pain laterality, unspecified whether sciatica present  Abnormal posture  Muscle weakness (generalized)     Problem List Patient Active Problem List   Diagnosis Date Noted  . Vitamin D deficiency 12/26/2018  . Chronic bilateral low back pain with bilateral sciatica (Primary Area of Pain) (L>R) 12/20/2018  . Chronic pain of both lower extremities (Secondary Area of Pain) disease LR 12/20/2018  . Chronic neck pain [facility] Area of Pain) (R>L) 12/20/2018  . Chronic female pelvic pain (Fourth Area of Pain) 12/20/2018  . Long term current use of opiate analgesic 12/20/2018  . Long term prescription benzodiazepine use 12/20/2018  . Neck pain 08/30/2018  . Lumbar herniated disc 08/30/2018  . Insomnia 07/15/2015  . H/O urinary stone 07/15/2015  . Gastric catarrh 07/15/2015  . Fibromyalgia 11/13/2014  . Anxiety state 08/07/2014  . GERD (gastroesophageal reflux disease) 08/07/2014  . Compulsive tobacco user syndrome 08/07/2014  .  Major depressive disorder, single episode 08/07/2014  . Constipation 08/07/2014  . Tobacco use disorder 08/07/2014  . Chronic obstructive pulmonary disease ([place]) 08/07/2014  . Esophageal reflux 08/07/2014  . Palpitations 06/13/2013  . Hematuria 06/29/2012  . Myalgia and myositis 06/29/2012  . High-tone pelvic floor dysfunction 04/27/2012  . Interstitial cystitis (chronic) without hematuria 08/19/2011  . Dysuria 08/19/2011  . Dyspareunia 08/19/2011  . Nocturia 08/19/2011  . Reduced libido 08/19/2011  . Urinary urgency 08/19/2011   Myles Gip PT, DPT  [phone]  07/11/2020, 5:09 PM  [facility] [address] [place], [place], [address] Phone: [phone]   Fax:  [phone]  Name: Martha Guerra MRN: [id number] Date of Birth: 06/17/1981

## 2020-07-24 ENCOUNTER — Other Ambulatory Visit: Payer: Self-pay | Admitting: Family Medicine

## 2020-07-24 DIAGNOSIS — G8929 Other chronic pain: Secondary | ICD-10-CM

## 2020-07-24 DIAGNOSIS — M5442 Lumbago with sciatica, left side: Secondary | ICD-10-CM

## 2020-07-25 MED ORDER — OXYCODONE HCL 15 MG PO TABS: 15.0000 mg | ORAL_TABLET | Freq: Four times a day (QID) | ORAL | 0 refills | Status: DC

## 2020-08-15 DIAGNOSIS — M549 Dorsalgia, unspecified: Secondary | ICD-10-CM | POA: Diagnosis not present

## 2020-08-21 ENCOUNTER — Other Ambulatory Visit: Payer: Self-pay | Admitting: Family Medicine

## 2020-08-21 DIAGNOSIS — M5442 Lumbago with sciatica, left side: Secondary | ICD-10-CM

## 2020-08-21 DIAGNOSIS — G8929 Other chronic pain: Secondary | ICD-10-CM

## 2020-08-21 MED ORDER — OXYCODONE HCL 15 MG PO TABS: 15.0000 mg | ORAL_TABLET | Freq: Four times a day (QID) | ORAL | 0 refills | Status: DC

## 2020-08-22 ENCOUNTER — Encounter: Payer: Self-pay | Admitting: Family Medicine

## 2020-08-28 ENCOUNTER — Telehealth: Payer: Self-pay

## 2020-08-28 NOTE — Telephone Encounter (Signed)
I called patient's insurance. I spoke with an agent. They state they received a PA from our office on 07/26/2020 and it was denied. I requested to complete another PA for patient's medication. I answer some questions and completed another PA over the phone. Turnaround time for this PA may take up to 24 hours. Case # [id number].

## 2020-08-28 NOTE — Telephone Encounter (Signed)
Copied from [place] [id number]. Topic: General - Other >> Aug 28, 2020  4:35 PM Yvette Rack wrote: Reason for CRM: Pt stated her insurance requires a PA for her medications and her pharmacy informed her that they have sent in the PA for the last 2 months (first sent on October 7 another sent on Nov 4). Pt stated her pharmacy is no longer willing to assist her and advised her to contact pcp office for assistance. Pt requests call back

## 2020-09-04 NOTE — Telephone Encounter (Signed)
Martha Guerra, an agent with patient's insurance called to let us know that the PA was denied for Oxycodone due to documentation not being submitted. Martha Guerra is requesting additional information to support why patient needs continued use for medication.  Martha Guerra states that we need to submit documents such as chart notes, prescription claims, labs etc.to support why patient needs continued opioid treatment. Martha Guerra states that we also need a current plan of care on file.    She states the dx that was given was chronic lumbago with sciatica. Martha Guerra states that the patient called them saying that the diagnosis is supposed to be chronic interstitual cystitis. Martha Guerra says the patient informed her that this was diagnosed by her urologist Dr. Alona Bene of [facility].   Martha Guerra needs this information faxed to patient's insurance. Fax number [phone]. She is faxing over a form to our office that request the items mentioned above.

## 2020-09-06 NOTE — Telephone Encounter (Signed)
I haven't seen any forms come through the fax. I called patient's insurance back and inquired about the form that was supposed to have been faxed. I was advised that the form they were sending was the denial letter. The agent I spoke with states the denial letter tells [place] what we need to submit for an appeal on this decision. A copy of the denial letter is in patient's chart in media section. Please review and advise.

## 2020-09-18 ENCOUNTER — Other Ambulatory Visit: Payer: Self-pay | Admitting: Family Medicine

## 2020-09-18 DIAGNOSIS — M5442 Lumbago with sciatica, left side: Secondary | ICD-10-CM

## 2020-09-18 DIAGNOSIS — G8929 Other chronic pain: Secondary | ICD-10-CM

## 2020-09-18 MED ORDER — OXYCODONE HCL 15 MG PO TABS: 15.0000 mg | ORAL_TABLET | Freq: Four times a day (QID) | ORAL | 0 refills | Status: DC

## 2020-09-23 ENCOUNTER — Other Ambulatory Visit: Payer: Self-pay | Admitting: Family Medicine

## 2020-09-23 DIAGNOSIS — F411 Generalized anxiety disorder: Secondary | ICD-10-CM

## 2020-09-23 NOTE — Telephone Encounter (Signed)
Requested medication (s) are due for refill today - yes  Requested medication (s) are on the active medication list -yes  Future visit scheduled -no  Last refill: 08/28/20  Notes to clinic: Request RF non delegated Rx  Requested Prescriptions  Pending Prescriptions Disp Refills   alprazolam (XANAX) 2 MG tablet [Pharmacy Med Name: ALPRAZOLAM 2 MG TAB] 60 tablet     Sig: TAKE 1/2 TABLET BY MOUTH 4 TIMES DAILY MAX 2 TABLETS PER DAY      Not Delegated - Psychiatry:  Anxiolytics/Hypnotics Failed - 09/23/2020 11:52 AM      Failed - This refill cannot be delegated      Failed - Urine Drug Screen completed in last 360 days      Passed - Valid encounter within last 6 months    Recent Outpatient Visits           5 months ago Chronic bilateral low back pain with bilateral sciatica (Primary Area of Pain) (L>R)   [facility] Birdie Sons, MD   7 months ago Lumbar herniated disc   [facility] Fenton Malling M, [place]   11 months ago [place], Donald E, MD   11 months ago    [place], Kirstie Peri, MD   1 year ago [place], Donald E, MD                  Requested Prescriptions  Pending Prescriptions Disp Refills   alprazolam Duanne Moron) 2 MG tablet [Pharmacy Med Name: ALPRAZOLAM 2 MG TAB] 60 tablet     Sig: TAKE 1/2 TABLET BY MOUTH 4 TIMES DAILY MAX 2 TABLETS PER DAY      Not Delegated - Psychiatry:  Anxiolytics/Hypnotics Failed - 09/23/2020 11:52 AM      Failed - This refill cannot be delegated      Failed - Urine Drug Screen completed in last 360 days      Passed - Valid encounter within last 6 months    Recent Outpatient Visits           5 months ago Chronic bilateral low back pain with bilateral sciatica (Primary Area of Pain) (L>R)   [facility] Birdie Sons, MD   7 months ago Lumbar herniated disc   [facility] Fenton Malling M, [place]   11 months ago [place], Donald E, MD   11 months ago    [place], Kirstie Peri, MD   1 year ago Anxiety state   [facility] Caryn Section, Kirstie Peri, MD

## 2020-09-26 DIAGNOSIS — N9489 Other specified conditions associated with female genital organs and menstrual cycle: Secondary | ICD-10-CM | POA: Diagnosis not present

## 2020-09-26 DIAGNOSIS — M797 Fibromyalgia: Secondary | ICD-10-CM | POA: Diagnosis not present

## 2020-09-26 DIAGNOSIS — Z79891 Long term (current) use of opiate analgesic: Secondary | ICD-10-CM | POA: Diagnosis not present

## 2020-09-26 DIAGNOSIS — N3010 Interstitial cystitis (chronic) without hematuria: Secondary | ICD-10-CM | POA: Diagnosis not present

## 2020-10-11 IMAGING — CR DG LUMBAR SPINE COMPLETE 4+V
1 series · 6 of 6 positions shown · non-contrast
Comparison: 02/19/2015

CLINICAL DATA: Acute on chronic low back pain, worsening over last
3 days

EXAM:
LUMBAR SPINE - COMPLETE 4+ VIEW

[Series 1: dg lumbar spine complete 4 +v · 0.14mm/px · 6 of 6 slices shown]
[im 1/6]
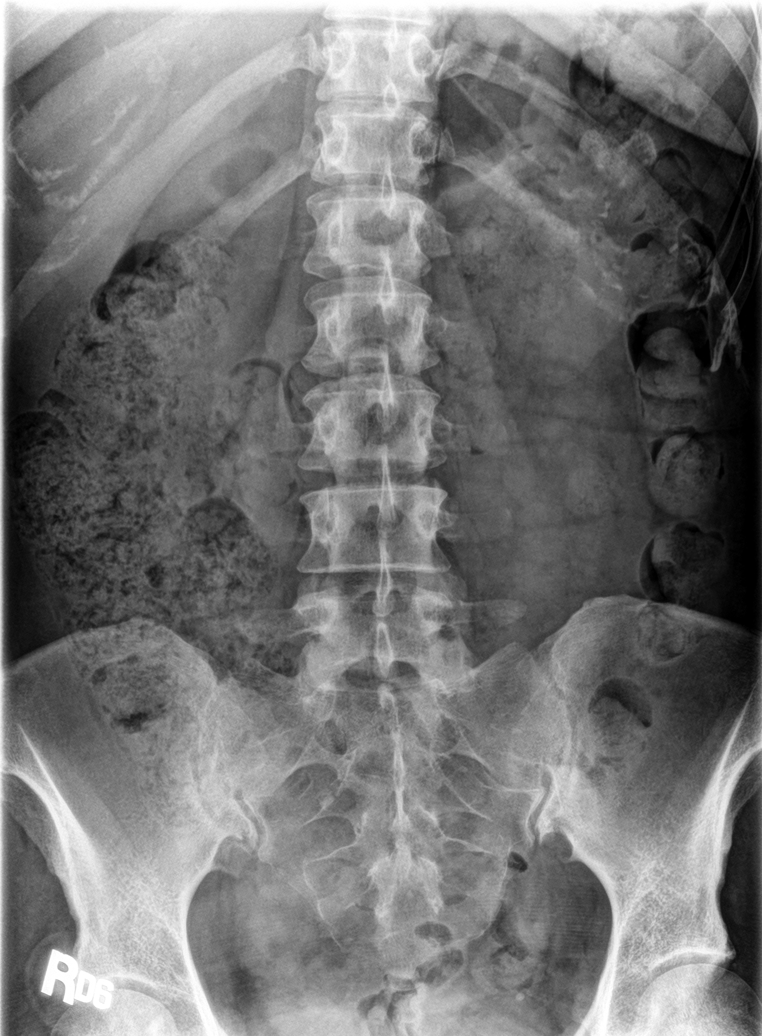
[im 2/6]
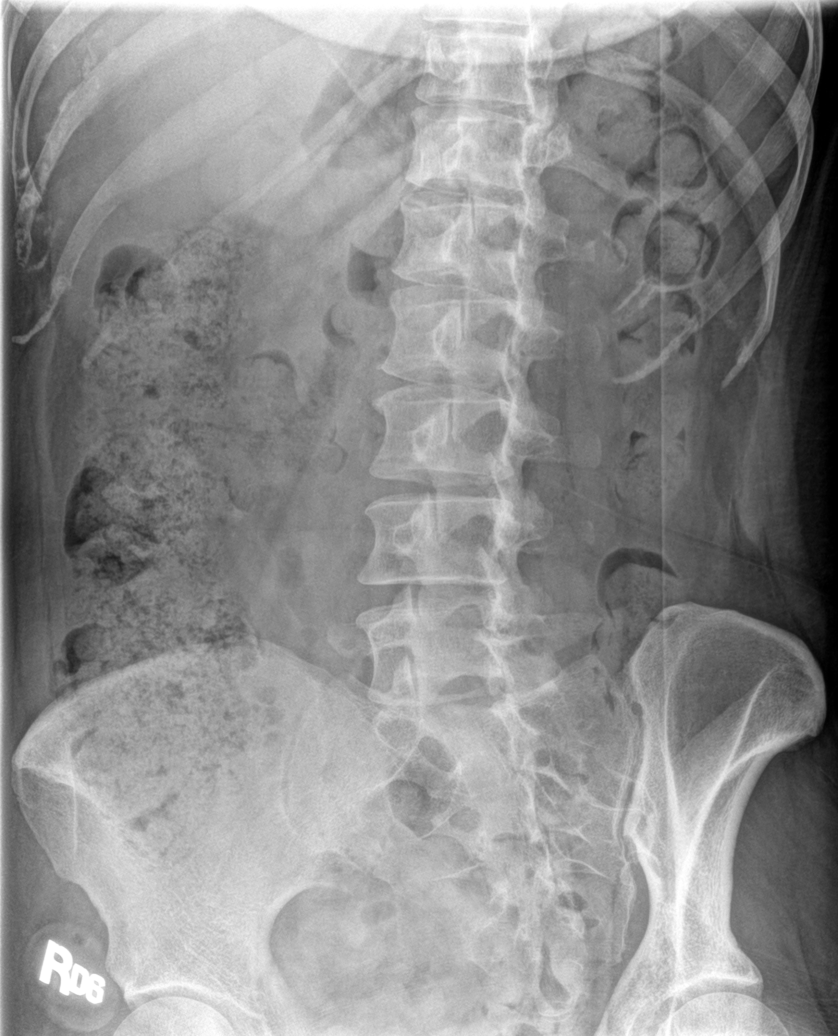
[im 3/6]
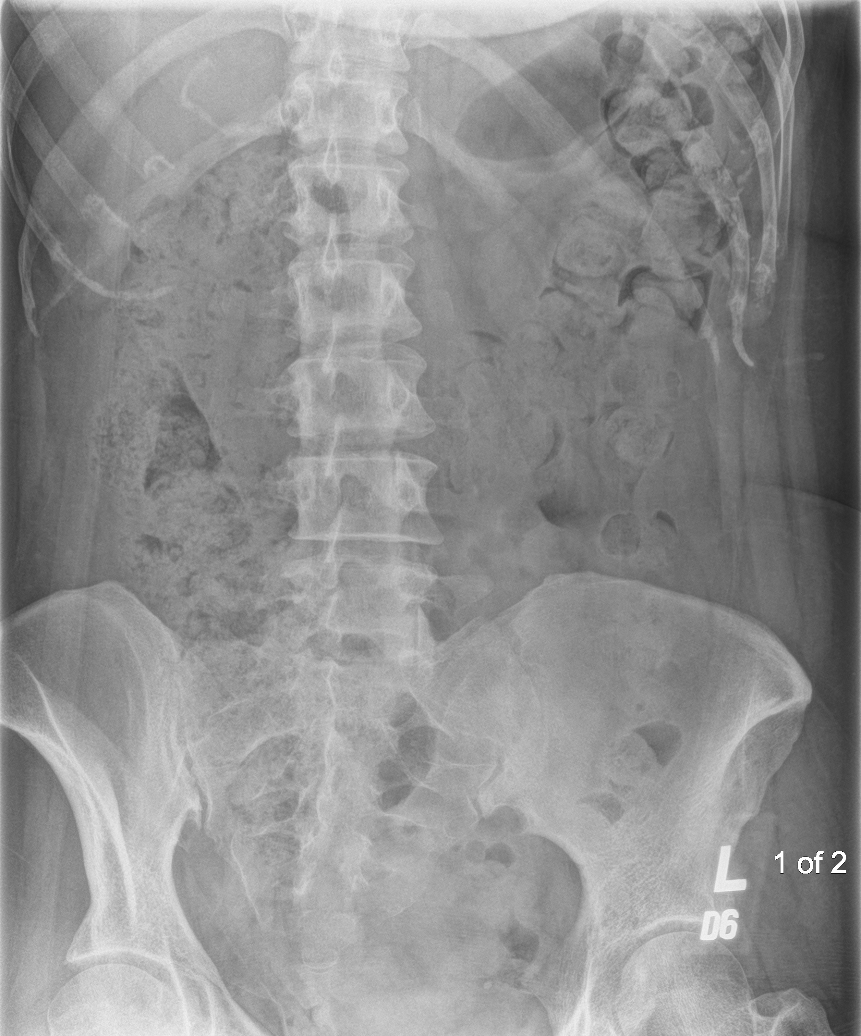
[im 4/6]
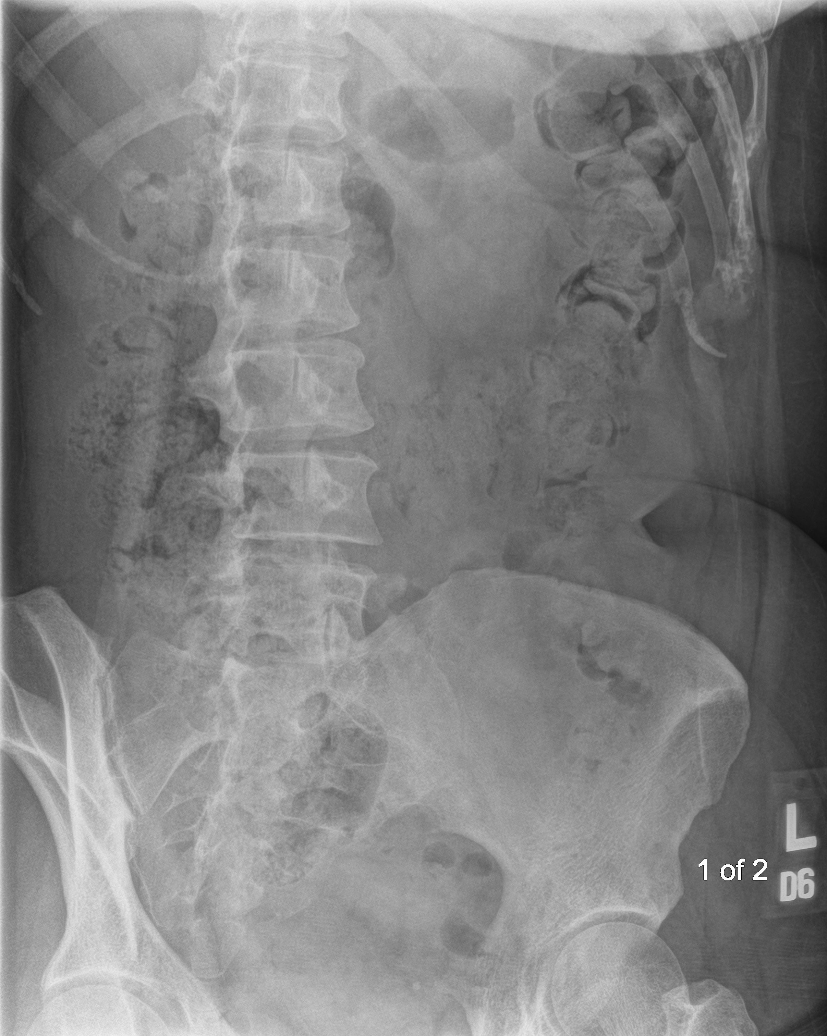
[im 5/6]
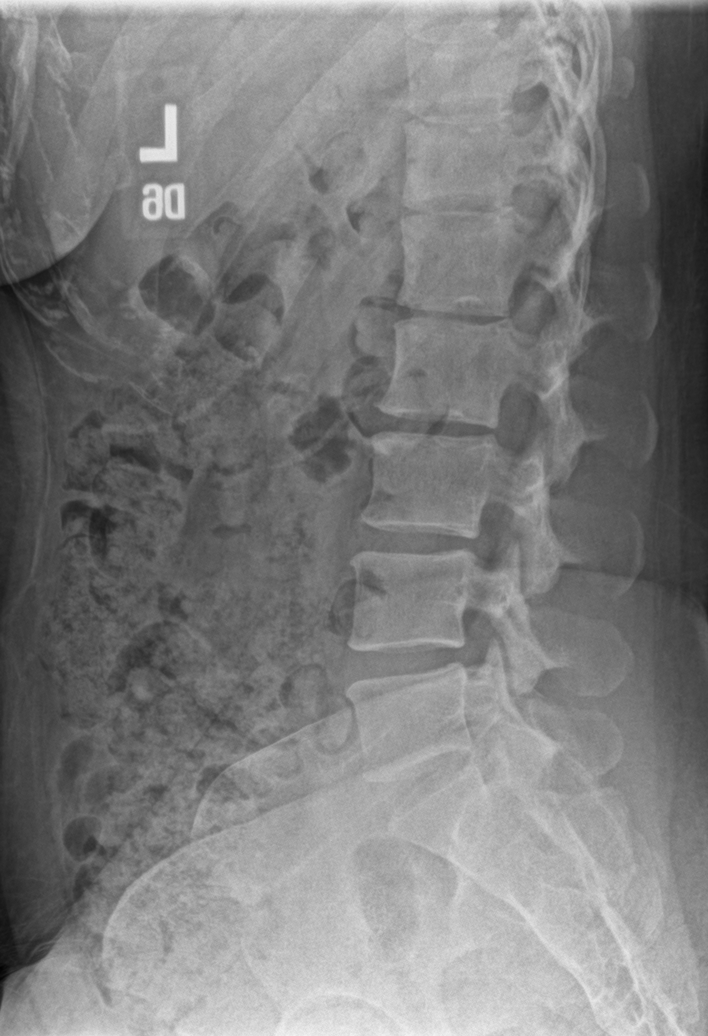
[im 6/6]
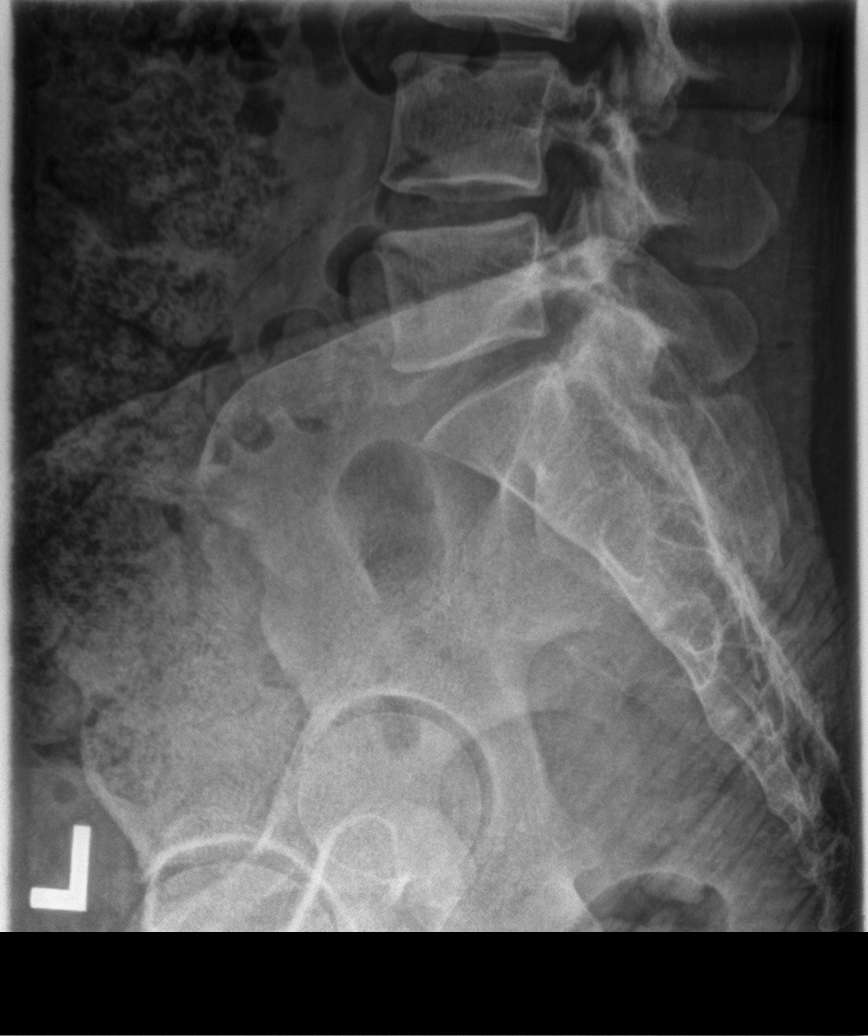

[6 of 6 positions shown; findings below may reference images not displayed]

FINDINGS: Frontal, bilateral oblique, and lateral views of the lumbar spine
are obtained. There are 5 non-rib-bearing lumbar type vertebral
bodies in anatomic alignment. No fractures. Mild spondylosis at
L2/L3, stable. Otherwise disc spaces are well preserved. Sacroiliac
joints are normal.

Incidental note of moderate fecal retention within the proximal
colon.
IMPRESSION: 1. Minimal lumbar spondylosis.  No acute bony abnormality.

## 2020-10-15 ENCOUNTER — Other Ambulatory Visit: Payer: Self-pay | Admitting: Family Medicine

## 2020-10-15 DIAGNOSIS — G8929 Other chronic pain: Secondary | ICD-10-CM

## 2020-10-15 DIAGNOSIS — M5442 Lumbago with sciatica, left side: Secondary | ICD-10-CM

## 2020-10-16 ENCOUNTER — Other Ambulatory Visit: Payer: Self-pay | Admitting: Family Medicine

## 2020-10-16 DIAGNOSIS — M5441 Lumbago with sciatica, right side: Secondary | ICD-10-CM

## 2020-10-16 DIAGNOSIS — G8929 Other chronic pain: Secondary | ICD-10-CM

## 2020-10-16 NOTE — Telephone Encounter (Signed)
Copied from CRM [id number]. Topic: Quick Communication - Rx Refill/Question >> Oct 16, 2020 11:57 AM Gaetana Michaelis A wrote: Medication: oxyCODONE (ROXICODONE) 15 MG (expects to run out over the weekend)   Has the patient contacted their pharmacy? No. Patient typically uses MyChart for communication   Preferred Pharmacy (with phone number or street name): [facility] [place] [place] - [address] Phone: [phone]  Agent: Please be advised that RX refills may take up to 3 business days. We ask that you follow-up with your pharmacy.

## 2020-10-16 NOTE — Telephone Encounter (Signed)
Copied from CRM #[id number]. Topic: Quick Communication - Rx Refill/Question >> Oct 16, 2020 11:57 AM Coley, Everette A wrote: Medication: oxyCODONE (ROXICODONE) 15 MG (expects to run out over the weekend)   Has the patient contacted their pharmacy? No. Patient typically uses MyChart for communication   Preferred Pharmacy (with phone number or street name): [facility] [place] [place] - [address] Phone: [phone]  Agent: Please be advised that RX refills may take up to 3 business days. We ask that you follow-up with your pharmacy. 

## 2020-10-16 NOTE — Telephone Encounter (Signed)
Requested medication (s) are due for refill today: due in 4 days   Requested medication (s) are on the active medication list: yes  Last refill:  09/18/20 #120 0 refills   Future visit scheduled: no  Notes to clinic:  not delegate per protocol, Patient requesting today due to PCP will be on vacation and will complete previous Rx over the weekend. Patient reports she was told to request medication. See previous My Chart request     Requested Prescriptions  Pending Prescriptions Disp Refills   oxyCODONE (ROXICODONE) 15 MG immediate release tablet 120 tablet 0    Sig: Take 1 tablet (15 mg total) by mouth in the morning, at noon, in the evening, and at bedtime.      Not Delegated - Analgesics:  Opioid Agonists Failed - 10/16/2020  1:56 PM      Failed - This refill cannot be delegated      Failed - Urine Drug Screen completed in last 360 days      Failed - Valid encounter within last 6 months    Recent Outpatient Visits           6 months ago Chronic bilateral low back pain with bilateral sciatica (Primary Area of Pain) (L>R)   [facility] Malva Limes, MD   7 months ago Lumbar herniated disc   [facility] [place], Alessandra Bevels, [place]   1 year ago Bronchitis   [facility] Malva Limes, MD   1 year ago    [facility] Malva Limes, MD   1 year ago Anxiety state   [facility] Malva Limes, MD

## 2020-10-17 MED ORDER — OXYCODONE HCL 15 MG PO TABS: 15.0000 mg | ORAL_TABLET | Freq: Four times a day (QID) | ORAL | 0 refills | Status: DC

## 2020-11-05 DIAGNOSIS — F900 Attention-deficit hyperactivity disorder, predominantly inattentive type: Secondary | ICD-10-CM | POA: Diagnosis not present

## 2020-11-05 DIAGNOSIS — F3189 Other bipolar disorder: Secondary | ICD-10-CM | POA: Diagnosis not present

## 2020-11-05 DIAGNOSIS — F4310 Post-traumatic stress disorder, unspecified: Secondary | ICD-10-CM | POA: Diagnosis not present

## 2020-11-05 DIAGNOSIS — F410 Panic disorder [episodic paroxysmal anxiety] without agoraphobia: Secondary | ICD-10-CM | POA: Diagnosis not present

## 2020-11-15 ENCOUNTER — Other Ambulatory Visit: Payer: Self-pay | Admitting: Family Medicine

## 2020-11-15 DIAGNOSIS — M5441 Lumbago with sciatica, right side: Secondary | ICD-10-CM

## 2020-11-15 DIAGNOSIS — G8929 Other chronic pain: Secondary | ICD-10-CM

## 2020-11-15 MED ORDER — OXYCODONE HCL 15 MG PO TABS: 15.0000 mg | ORAL_TABLET | Freq: Four times a day (QID) | ORAL | 0 refills | Status: DC

## 2020-11-26 ENCOUNTER — Ambulatory Visit: Payer: Self-pay

## 2020-11-26 NOTE — Telephone Encounter (Addendum)
2nd attempt to contact pt to complete nurse triage; left message on voicemail; will route to office for final disposition.

## 2020-11-26 NOTE — Telephone Encounter (Signed)
Patient called, left VM to return the call to the office to speak to a TN.   From: Martha Guerra  Sent: 11/25/2020  7:11 PM EST  To: [facility] Admin  Subject: Appointment scheduled from [place] For: Martha Guerra ([id number])  Visit Type: OFFICE VISIT (1004)    12/02/2020  1:20 PM 20 mins. Birdie Sons, MD   [facility]    Patient Comments:  Constant nausea for 2 weeks, pain in upper right side along with a knot. Stool is very odd, color half blue and green. Somehow lost 30 pounds since Thanksgiving, constantly feeling hungry and lethargic and weak. Feels like I'm running a fever at the moment. Stomach really hurts. Best described as the same soreness you'd feel after doing way too many sit-ups, except I haven't been to the gym in 3 weeks. The knot in my side scares me. So very nauseated

## 2020-12-02 ENCOUNTER — Encounter: Payer: Self-pay | Admitting: Family Medicine

## 2020-12-02 ENCOUNTER — Ambulatory Visit: Payer: Medicaid Other | Admitting: Family Medicine

## 2020-12-02 ENCOUNTER — Other Ambulatory Visit: Payer: Self-pay

## 2020-12-02 VITALS — BP 113/73 | HR 91 | Temp 98.0°F | Resp 16 | Ht 64.0 in | Wt 158.0 lb

## 2020-12-02 DIAGNOSIS — E559 Vitamin D deficiency, unspecified: Secondary | ICD-10-CM | POA: Diagnosis not present

## 2020-12-02 DIAGNOSIS — R1031 Right lower quadrant pain: Secondary | ICD-10-CM

## 2020-12-02 DIAGNOSIS — R110 Nausea: Secondary | ICD-10-CM

## 2020-12-02 DIAGNOSIS — E519 Thiamine deficiency, unspecified: Secondary | ICD-10-CM

## 2020-12-02 DIAGNOSIS — F321 Major depressive disorder, single episode, moderate: Secondary | ICD-10-CM

## 2020-12-02 DIAGNOSIS — R109 Unspecified abdominal pain: Secondary | ICD-10-CM

## 2020-12-02 LAB — POCT URINALYSIS DIPSTICK
Bilirubin, UA: NEGATIVE
Glucose, UA: NEGATIVE
Ketones, UA: NEGATIVE
Nitrite, UA: NEGATIVE
Protein, UA: NEGATIVE
Spec Grav, UA: 1.025 (ref 1.010–1.025)
Urobilinogen, UA: 0.2 E.U./dL
pH, UA: 6 (ref 5.0–8.0)

## 2020-12-02 NOTE — Telephone Encounter (Signed)
Tried calling patient. No answer. Left message to call back. OK for [facility] Triage nurse to speak with patient and triage patients reason for visit.

## 2020-12-02 NOTE — Patient Instructions (Addendum)
.   Please go to the lab draw station in [address] on the second floor of [facility]. Normal hours are 8:00am to 11:30am and 1:00pm to 4:00pm Monday through Friday  

## 2020-12-02 NOTE — Progress Notes (Signed)
Established patient visit   Patient: Martha Guerra   DOB: 1980/12/21   40 y.o. Female  MRN: [id number] Visit Date: 12/02/2020  Today's healthcare provider: Lelon Huh, MD   Chief Complaint  Patient presents with  . Nausea  . Abdominal Pain   Subjective    Abdominal Pain This is a new problem. The current episode started 1 to 4 weeks ago (about 3 weeks). The onset quality is gradual. The problem has been unchanged. The pain is located in the generalized abdominal region. The pain is moderate. The quality of the pain is dull and a sensation of fullness. The abdominal pain radiates to the RLQ and RUQ. Associated symptoms include belching, flatus, nausea and weight loss. Pertinent negatives include no hematochezia or hematuria. The pain is relieved by nothing. She has tried nothing for the symptoms.       Medications: Outpatient Medications Prior to Visit  Medication Sig  . alprazolam (XANAX) 2 MG tablet TAKE 1/2 TABLET BY MOUTH 4 TIMES DAILY MAX 2 TABLETS PER DAY  . amphetamine-dextroamphetamine (ADDERALL) 10 MG tablet Take by mouth.  Marland Kitchen ELMIRON 100 MG capsule Take 100 mg by mouth 3 (three) times daily.  Marland Kitchen lamoTRIgine (LAMICTAL) 100 MG tablet   . linaclotide (LINZESS) 145 MCG CAPS capsule Take 1 capsule (145 mcg total) by mouth daily before breakfast.  . omeprazole (PRILOSEC) 40 MG capsule TAKE 1 CAPSULE BY MOUTH ONCE DAILY  . oxyCODONE (ROXICODONE) 15 MG immediate release tablet Take 1 tablet (15 mg total) by mouth in the morning, at noon, in the evening, and at bedtime.  . topiramate (TOPAMAX) 50 MG tablet Take 50 mg by mouth daily.  . TRINTELLIX 20 MG TABS   . lidocaine (XYLOCAINE) 2 % jelly 1 application as needed.  . lidocaine (XYLOCAINE) 5 % ointment Apply 1-2 x a day as directed   No facility-administered medications prior to visit.    Review of Systems  Constitutional: Positive for weight loss.  Gastrointestinal: Positive for abdominal pain, flatus and  nausea. Negative for hematochezia.  Genitourinary: Negative for hematuria.       Objective    BP 113/73   Pulse 91   Temp 98 F (36.7 C)   Resp 16   Ht 5\' 4"  (1.626 m)   Wt 158 lb (71.7 kg)   BMI 27.12 kg/m     Physical Exam    General: Appearance:     Overweight female in no acute distress  Eyes:    PERRL, conjunctiva/corneas clear, EOM's intact       Lungs:     Clear to auscultation bilaterally, respirations unlabored  Heart:    Normal heart rate. Normal rhythm. No murmurs, rubs, or gallops.   MS:   All extremities are intact.   Neurologic:   Awake, alert, oriented x 3. No apparent focal neurological           defect.         Results for orders placed or performed in visit on 12/02/20  POCT Urinalysis Dipstick  Result Value Ref Range   Color, UA yellow    Clarity, UA clear    Glucose, UA Negative Negative   Bilirubin, UA Negative    Ketones, UA Negative    Spec Grav, UA 1.025 1.010 - 1.025   Blood, UA trace (non-hemolyzed)    pH, UA 6.0 5.0 - 8.0   Protein, UA Negative Negative   Urobilinogen, UA 0.2 0.2 or 1.0 E.U./dL  Nitrite, UA Negative    Leukocytes, UA Trace (A) Negative   Appearance     Odor      Assessment & Plan     1. Right lower quadrant abdominal pain Has mostly resolved, but still with nausea as below. Consider GI ultrasound after reviewing labs.   2. Right lateral abdominal pain   3. Nausea  - CBC - Comprehensive metabolic panel - TSH - Amylase  4. Vitamin D deficiency Found to have d 25-oh=7.8 in 2018, but she is not taking any d supplements.  - VITAMIN D 25 Hydroxy (Vit-D Deficiency, Fractures)  5. Vitamin B1 deficiency By patient report, is not on supplements.  - Vitamin B12  6. Current moderate episode of major depressive disorder without prior episode ([place]) Continue follow up. Dr. Kasandra Knudsen as scheduled.   She declined flu vaccine today.      The entirety of the information documented in the History of Present Illness,  Review of Systems and Physical Exam were personally obtained by me. Portions of this information were initially documented by the CMA and reviewed by me for thoroughness and accuracy.      Lelon Huh, MD  [facility] [phone] (phone) [phone] (fax)  [place]

## 2020-12-02 NOTE — Telephone Encounter (Signed)
Patient has appointment today, will close out encounter.

## 2020-12-03 ENCOUNTER — Telehealth: Payer: Self-pay

## 2020-12-03 LAB — CBC
Hematocrit: 45.1 % (ref 34.0–46.6)
Hemoglobin: 15.4 g/dL (ref 11.1–15.9)
MCH: 30.4 pg (ref 26.6–33.0)
MCHC: 34.1 g/dL (ref 31.5–35.7)
MCV: 89 fL (ref 79–97)
Platelets: 324 10*3/uL (ref 150–450)
RBC: 5.07 x10E6/uL (ref 3.77–5.28)
RDW: 12.4 % (ref 11.7–15.4)
WBC: 7.1 10*3/uL (ref 3.4–10.8)

## 2020-12-03 LAB — COMPREHENSIVE METABOLIC PANEL
ALT: 19 IU/L (ref 0–32)
AST: 19 IU/L (ref 0–40)
Albumin/Globulin Ratio: 1.9 (ref 1.2–2.2)
Albumin: 4.3 g/dL (ref 3.8–4.8)
Alkaline Phosphatase: 85 IU/L (ref 44–121)
BUN/Creatinine Ratio: 19 (ref 9–23)
BUN: 12 mg/dL (ref 6–24)
Bilirubin Total: 0.3 mg/dL (ref 0.0–1.2)
CO2: 21 mmol/L (ref 20–29)
Calcium: 9.3 mg/dL (ref 8.7–10.2)
Chloride: 103 mmol/L (ref 96–106)
Creatinine, Ser: 0.62 mg/dL (ref 0.57–1.00)
GFR calc Af Amer: 130 mL/min/{1.73_m2} (ref >59)
GFR calc non Af Amer: 113 mL/min/{1.73_m2} (ref >59)
Globulin, Total: 2.3 g/dL (ref 1.5–4.5)
Glucose: 101 mg/dL — ABNORMAL HIGH (ref 65–99)
Potassium: 4.5 mmol/L (ref 3.5–5.2)
Sodium: 139 mmol/L (ref 134–144)
Total Protein: 6.6 g/dL (ref 6.0–8.5)

## 2020-12-03 LAB — AMYLASE: Amylase: 33 U/L (ref 31–110)

## 2020-12-03 LAB — TSH: TSH: 0.549 u[IU]/mL (ref 0.450–4.500)

## 2020-12-03 LAB — VITAMIN D 25 HYDROXY (VIT D DEFICIENCY, FRACTURES): Vit D, 25-Hydroxy: 7.5 ng/mL — ABNORMAL LOW (ref 30.0–100.0)

## 2020-12-03 LAB — VITAMIN B12: Vitamin B-12: 347 pg/mL (ref 232–1245)

## 2020-12-03 NOTE — Telephone Encounter (Signed)
Pt called but did not answer so a VM was left to advise pt to call back.

## 2020-12-03 NOTE — Telephone Encounter (Signed)
-----   Message from Birdie Sons, MD sent at 12/03/2020 12:32 PM EST ----- Vitamin d levels are extremely low, need to start daily vitamin d3 5,000 units once a day. Rest of labs are normal. Recommend abdominal ultrasound for chronic nausea.  Also need to schedule follow up office visit for vitamin d in 2 months.

## 2020-12-04 DIAGNOSIS — H5213 Myopia, bilateral: Secondary | ICD-10-CM | POA: Diagnosis not present

## 2020-12-09 ENCOUNTER — Ambulatory Visit: Payer: Self-pay | Admitting: *Deleted

## 2020-12-09 NOTE — Telephone Encounter (Signed)
Pt given lab results per notes of Dr. Caryn Section from 12/03/20 on 12/09/20. Pt verbalized understanding and would like to know if she should be prescribed vit D 50,000 units as she has received in the past. Patient would like to know if PCP feels vit D3 5,000 units will be enough due to her chronic it D deficiency. appt scheduled for 02/24/21 for 2 month f/u.

## 2020-12-10 MED ORDER — VITAMIN D (ERGOCALCIFEROL) 1.25 MG (50000 UNIT) PO CAPS: 50000.0000 [IU] | ORAL_CAPSULE | ORAL | 4 refills | Status: DC

## 2020-12-10 NOTE — Telephone Encounter (Signed)
That's fine, have sent prescription for 50,000 units once a week to [facility]

## 2020-12-10 NOTE — Addendum Note (Signed)
Addended by: Birdie Sons on: 12/10/2020 06:57 AM   Modules accepted: Orders

## 2020-12-16 ENCOUNTER — Other Ambulatory Visit: Payer: Self-pay | Admitting: Family Medicine

## 2020-12-16 DIAGNOSIS — G8929 Other chronic pain: Secondary | ICD-10-CM

## 2020-12-16 MED ORDER — OXYCODONE HCL 15 MG PO TABS: 15.0000 mg | ORAL_TABLET | Freq: Four times a day (QID) | ORAL | 0 refills | Status: DC

## 2021-01-13 ENCOUNTER — Other Ambulatory Visit: Payer: Self-pay | Admitting: Family Medicine

## 2021-01-13 DIAGNOSIS — M5442 Lumbago with sciatica, left side: Secondary | ICD-10-CM

## 2021-01-13 DIAGNOSIS — G8929 Other chronic pain: Secondary | ICD-10-CM

## 2021-01-13 MED ORDER — OXYCODONE HCL 15 MG PO TABS: 15.0000 mg | ORAL_TABLET | Freq: Four times a day (QID) | ORAL | 0 refills | Status: DC

## 2021-01-21 ENCOUNTER — Other Ambulatory Visit: Payer: Self-pay | Admitting: Family Medicine

## 2021-01-21 DIAGNOSIS — F411 Generalized anxiety disorder: Secondary | ICD-10-CM

## 2021-01-21 NOTE — Telephone Encounter (Signed)
Requested medications are due for refill today yes  Requested medications are on the active medication list yes  Last refill 3/7  Last visit 11/2020 although did not see this med addressed  Future visit scheduled Yes, 02/2021  Notes to clinic Not Delegated.

## 2021-01-28 DIAGNOSIS — F900 Attention-deficit hyperactivity disorder, predominantly inattentive type: Secondary | ICD-10-CM | POA: Diagnosis not present

## 2021-01-28 DIAGNOSIS — Z79899 Other long term (current) drug therapy: Secondary | ICD-10-CM | POA: Diagnosis not present

## 2021-01-28 DIAGNOSIS — F410 Panic disorder [episodic paroxysmal anxiety] without agoraphobia: Secondary | ICD-10-CM | POA: Diagnosis not present

## 2021-01-28 DIAGNOSIS — F4310 Post-traumatic stress disorder, unspecified: Secondary | ICD-10-CM | POA: Diagnosis not present

## 2021-02-11 ENCOUNTER — Other Ambulatory Visit: Payer: Self-pay | Admitting: Family Medicine

## 2021-02-11 DIAGNOSIS — M5442 Lumbago with sciatica, left side: Secondary | ICD-10-CM

## 2021-02-11 DIAGNOSIS — G8929 Other chronic pain: Secondary | ICD-10-CM

## 2021-02-11 MED ORDER — OXYCODONE HCL 15 MG PO TABS: 15.0000 mg | ORAL_TABLET | Freq: Four times a day (QID) | ORAL | 0 refills | Status: DC

## 2021-02-24 ENCOUNTER — Ambulatory Visit: Payer: Self-pay | Admitting: Family Medicine

## 2021-03-07 ENCOUNTER — Emergency Department
Admission: EM | Admit: 2021-03-07 | Discharge: 2021-03-07 | Disposition: A | Discharge status: Home / Self Care | Payer: Medicaid Other | Attending: Emergency Medicine | Admitting: Emergency Medicine

## 2021-03-07 ENCOUNTER — Other Ambulatory Visit: Payer: Self-pay

## 2021-03-07 DIAGNOSIS — S0185XA Open bite of other part of head, initial encounter: Secondary | ICD-10-CM | POA: Diagnosis not present

## 2021-03-07 DIAGNOSIS — S01402A Unspecified open wound of left cheek and temporomandibular area, initial encounter: Secondary | ICD-10-CM | POA: Diagnosis not present

## 2021-03-07 DIAGNOSIS — X58XXXA Exposure to other specified factors, initial encounter: Secondary | ICD-10-CM | POA: Diagnosis not present

## 2021-03-07 DIAGNOSIS — J45909 Unspecified asthma, uncomplicated: Secondary | ICD-10-CM | POA: Diagnosis not present

## 2021-03-07 DIAGNOSIS — Z87891 Personal history of nicotine dependence: Secondary | ICD-10-CM | POA: Insufficient documentation

## 2021-03-07 DIAGNOSIS — S0990XA Unspecified injury of head, initial encounter: Secondary | ICD-10-CM | POA: Diagnosis present

## 2021-03-07 DIAGNOSIS — J449 Chronic obstructive pulmonary disease, unspecified: Secondary | ICD-10-CM | POA: Insufficient documentation

## 2021-03-07 DIAGNOSIS — S0180XA Unspecified open wound of other part of head, initial encounter: Secondary | ICD-10-CM

## 2021-03-07 MED ORDER — MUPIROCIN CALCIUM 2 % EX CREA: 1.0000 "application " | TOPICAL_CREAM | Freq: Two times a day (BID) | CUTANEOUS | 0 refills | Status: AC

## 2021-03-07 MED ORDER — SULFAMETHOXAZOLE-TRIMETHOPRIM 800-160 MG PO TABS: 1.0000 | ORAL_TABLET | Freq: Two times a day (BID) | ORAL | 0 refills | Status: AC

## 2021-03-07 NOTE — ED Triage Notes (Signed)
Pt states that she is here to get stitches for the "hole in my face on my left cheek". Pt reports her cousin thought that there was something lodged into her skin so she attempted to remove it with tweezers and then cut it with small scissors. Pt states there was bleeding noted earlier. None currently noted. Covered with band-aid and topical abx cream. Last tetanus unknown.

## 2021-03-07 NOTE — ED Provider Notes (Signed)
[facility]-EMERGENCY DEPARTMENT  ____________________________________________  Time seen: Approximately 11:01 PM  I have reviewed the triage vital signs and the nursing notes.   HISTORY  Chief Complaint Skin Problem   Historian Patient     HPI Martha Guerra is a 40 y.o. female presents to the emergency department with a wound to the left face with surrounding cellulitis.  Patient states that a friend told her that she had a foreign body embedded in her skin and was using tweezers and attempt to remove it.  When questioning patient about foreign body was, she cannot identify it.  She states that friend macerated the skin and was digging in it with tweezers.  Patient has been applying Neosporin and a Band-Aid.  She has been touching affected area repeatedly.   Past Medical History:  Diagnosis Date  . Anxiety   . Asthma   . Chronic constipation   . Cystitides, interstitial, chronic   . Depression   . Gastritis   . GERD (gastroesophageal reflux disease)   . History of kidney stones   . History of ovarian cyst      Immunizations up to date:  Yes.     Past Medical History:  Diagnosis Date  . Anxiety   . Asthma   . Chronic constipation   . Cystitides, interstitial, chronic   . Depression   . Gastritis   . GERD (gastroesophageal reflux disease)   . History of kidney stones   . History of ovarian cyst     Patient Active Problem List   Diagnosis Date Noted  . Vitamin D deficiency 12/26/2018  . Chronic bilateral low back pain with bilateral sciatica (Primary Area of Pain) (L>R) 12/20/2018  . Chronic pain of both lower extremities (Secondary Area of Pain) disease LR 12/20/2018  . Chronic neck pain [facility] Area of Pain) (R>L) 12/20/2018  . Chronic female pelvic pain (Fourth Area of Pain) 12/20/2018  . Long term current use of opiate analgesic 12/20/2018  . Long term prescription benzodiazepine use 12/20/2018  . Neck pain 08/30/2018  . Lumbar herniated disc  08/30/2018  . Insomnia 07/15/2015  . H/O urinary stone 07/15/2015  . Gastric catarrh 07/15/2015  . Fibromyalgia 11/13/2014  . Anxiety state 08/07/2014  . GERD (gastroesophageal reflux disease) 08/07/2014  . Compulsive tobacco user syndrome 08/07/2014  . Major depressive disorder, single episode 08/07/2014  . Constipation 08/07/2014  . Tobacco use disorder 08/07/2014  . Chronic obstructive pulmonary disease ([place]) 08/07/2014  . Esophageal reflux 08/07/2014  . Palpitations 06/13/2013  . Hematuria 06/29/2012  . Myalgia and myositis 06/29/2012  . High-tone pelvic floor dysfunction 04/27/2012  . Interstitial cystitis (chronic) without hematuria 08/19/2011  . Dysuria 08/19/2011  . Dyspareunia 08/19/2011  . Nocturia 08/19/2011  . Reduced libido 08/19/2011  . Urinary urgency 08/19/2011    Past Surgical History:  Procedure Laterality Date  . cystectomy    . OVARIAN CYST SURGERY      Prior to Admission medications   Medication Sig Start Date End Date Taking? Authorizing Provider  mupirocin cream (BACTROBAN) 2 % Apply 1 application topically 2 (two) times daily for 7 days. 03/07/21 03/14/21 Yes Vallarie Mare M, PA-C  sulfamethoxazole-trimethoprim (BACTRIM DS) 800-160 MG tablet Take 1 tablet by mouth 2 (two) times daily for 7 days. 03/07/21 03/14/21 Yes Vallarie Mare M, PA-C  alprazolam Duanne Moron) 2 MG tablet TAKE 1/2 TABLET BY MOUTH 4 TIMES DAILY MAX 2 TABLETS PER DAY 01/22/21   Birdie Sons, MD  amphetamine-dextroamphetamine (ADDERALL) 10 MG tablet  Take by mouth.    [provider]  ELMIRON 100 MG capsule Take 100 mg by mouth 3 (three) times daily. 11/17/20   [provider]  lamoTRIgine (LAMICTAL) 100 MG tablet  07/16/15   [provider]  lidocaine (XYLOCAINE) 2 % jelly 1 application as needed.    [provider]  lidocaine (XYLOCAINE) 5 % ointment Apply 1-2 x a day as directed 06/16/18   [provider]  linaclotide (LINZESS) 145 MCG CAPS capsule  Take 1 capsule (145 mcg total) by mouth daily before breakfast. 02/23/20   Marlyn Corporal, Clearnce Sorrel, PA-C  omeprazole (PRILOSEC) 40 MG capsule TAKE 1 CAPSULE BY MOUTH ONCE DAILY 07/20/19   Birdie Sons, MD  oxyCODONE (ROXICODONE) 15 MG immediate release tablet Take 1 tablet (15 mg total) by mouth in the morning, at noon, in the evening, and at bedtime. 02/11/21   Birdie Sons, MD  topiramate (TOPAMAX) 50 MG tablet Take 50 mg by mouth daily.    Myer Haff, MD  TRINTELLIX 20 MG TABS  07/16/15   [provider]  Vitamin D, Ergocalciferol, (DRISDOL) 1.25 MG (50000 UNIT) CAPS capsule Take 1 capsule (50,000 Units total) by mouth every 7 (seven) days. 12/10/20   Birdie Sons, MD    Allergies Prednisone and Acetaminophen  Family History  Problem Relation Age of Onset  . Heart attack Mother   . Hypertension Father     Social History Social History   Tobacco Use  . Smoking status: Former Smoker    Packs/day: 0.50    Years: 17.00    Pack years: 8.50    Types: Cigarettes    Quit date: 12/12/2018    Years since quitting: 2.2  . Smokeless tobacco: Never Used  . Tobacco comment: started smoking age 59, 1/2-1 ppd, quit smoking 01/2018  Vaping Use  . Vaping Use: Never used  Substance Use Topics  . Alcohol use: No  . Drug use: No     Review of Systems  Constitutional: No fever/chills Eyes:  No discharge ENT: No upper respiratory complaints. Respiratory: no cough. No SOB/ use of accessory muscles to breath Gastrointestinal:   No nausea, no vomiting.  No diarrhea.  No constipation. Musculoskeletal: Negative for musculoskeletal pain. Skin: Patient has facial wound.     ____________________________________________   PHYSICAL EXAM:  VITAL SIGNS: ED Triage Vitals  Enc Vitals Group     BP 03/07/21 2042 127/87     Pulse Rate 03/07/21 2042 (!) 109     Resp 03/07/21 2042 18     Temp 03/07/21 2042 98.6 F (37 C)     Temp Source 03/07/21 2042 Oral     SpO2 03/07/21 2042 97 %      Weight 03/07/21 2043 150 lb (68 kg)     Height 03/07/21 2043 5\' 4"  (1.626 m)     Head Circumference --      Peak Flow --      Pain Score 03/07/21 2043 0     Pain Loc --      Pain Edu? --      Excl. in [place]? --      Constitutional: Alert and oriented. Well appearing and in no acute distress. Eyes: Conjunctivae are normal. PERRL. EOMI. Head: Atraumatic. ENT:      Nose: No congestion/rhinnorhea.      Mouth/Throat: Mucous membranes are moist.  Neck: No stridor.  No cervical spine tenderness to palpation.  Cardiovascular: Normal rate, regular rhythm. Normal S1 and  S2.  Good peripheral circulation. Respiratory: Normal respiratory effort without tachypnea or retractions. Lungs CTAB. Good air entry to the bases with no decreased or absent breath sounds Gastrointestinal: Bowel sounds x 4 quadrants. Soft and nontender to palpation. No guarding or rigidity. No distention. Musculoskeletal: Full range of motion to all extremities. No obvious deformities noted Neurologic:  Normal for age. No gross focal neurologic deficits are appreciated.  Skin: Patient has macerated wound to left cheek approximately 1 and half centimeter by 1 and half centimeter.  Wound is [place] with overlying slough and has approximately 1 cm of surrounding cellulitis. Psychiatric: Mood and affect are normal for age. Speech and behavior are normal.   ____________________________________________   LABS (all labs ordered are listed, but only abnormal results are displayed)  Labs Reviewed - No data to display ____________________________________________  EKG   ____________________________________________  RADIOLOGY   No results found.  ____________________________________________    PROCEDURES  Procedure(s) performed:     Procedures     Medications - No data to display   ____________________________________________   INITIAL IMPRESSION / ASSESSMENT AND PLAN / ED COURSE  Pertinent labs &  imaging results that were available during my care of the patient were reviewed by me and considered in my medical decision making (see chart for details).      Assessment and plan Facial wound 40 year old female presents to the emergency department with a facial wound.  Patient was started on Keflex and topical mupirocin.  She was advised to stop touching the affected area.  Patient requested sutures and I told her that wound was not conducive to laceration repair.  All patient questions were answered.     ____________________________________________  FINAL CLINICAL IMPRESSION(S) / ED DIAGNOSES  Final diagnoses:  Open wound of face, initial encounter      NEW MEDICATIONS STARTED DURING THIS VISIT:  ED Discharge Orders         Ordered    mupirocin cream (BACTROBAN) 2 %  2 times daily        03/07/21 2209    sulfamethoxazole-trimethoprim (BACTRIM DS) 800-160 MG tablet  2 times daily        03/07/21 2209              This chart was dictated using voice recognition software/Dragon. Despite best efforts to proofread, errors can occur which can change the meaning. Any change was purely unintentional.     Lannie Fields, PA-C 03/07/21 2305    Nance Pear, MD 03/07/21 2312

## 2021-03-07 NOTE — Discharge Instructions (Addendum)
Take Bactrim twice daily for seven days.  Apply Mupirocin twice daily for seven days.

## 2021-03-10 ENCOUNTER — Telehealth: Payer: Self-pay

## 2021-03-10 NOTE — Telephone Encounter (Signed)
Transition Care Management Unsuccessful Follow-up Telephone Call  Date of discharge and from where:  03/07/2021 from [facility]  Attempts:  1st Attempt  Reason for unsuccessful TCM follow-up call:  Left voice message

## 2021-03-11 ENCOUNTER — Other Ambulatory Visit: Payer: Self-pay | Admitting: Family Medicine

## 2021-03-11 DIAGNOSIS — G8929 Other chronic pain: Secondary | ICD-10-CM

## 2021-03-11 DIAGNOSIS — M5442 Lumbago with sciatica, left side: Secondary | ICD-10-CM

## 2021-03-11 MED ORDER — OXYCODONE HCL 15 MG PO TABS: 15.0000 mg | ORAL_TABLET | Freq: Four times a day (QID) | ORAL | 0 refills | Status: DC

## 2021-03-11 NOTE — Telephone Encounter (Signed)
Pt called saying Mandy at [facility] called pt and told her to reach out to her provider and ask them to send in a PA for her pain rx. Or she would have to pay out of pocket.

## 2021-03-11 NOTE — Telephone Encounter (Signed)
Transition Care Management Unsuccessful Follow-up Telephone Call  Date of discharge and from where:  03/07/2021 from [facility]  Attempts:  2nd Attempt  Reason for unsuccessful TCM follow-up call:  Left voice message

## 2021-03-12 NOTE — Telephone Encounter (Signed)
Transition Care Management Unsuccessful Follow-up Telephone Call  Date of discharge and from where:  03/07/2021 from [facility]  Attempts:  3rd Attempt  Reason for unsuccessful TCM follow-up call:  Unable to reach patient

## 2021-03-19 ENCOUNTER — Encounter: Payer: Self-pay | Admitting: Family Medicine

## 2021-03-19 ENCOUNTER — Telehealth: Payer: Self-pay | Admitting: Orthopedic Surgery

## 2021-03-19 ENCOUNTER — Telehealth: Payer: Self-pay | Admitting: Family Medicine

## 2021-03-19 NOTE — Telephone Encounter (Signed)
Rachelle, were you working on this PA last week? Do you have an update?

## 2021-03-19 NOTE — Telephone Encounter (Signed)
Pt has sent detailed message through mychart concerning PA for oxycodone. The PA dept phone number [phone]

## 2021-03-19 NOTE — Telephone Encounter (Signed)
Dr. Caryn Section have you seen a fax for a prior authorization on this patient? Rachelle initiated a PA via telephone last week, but patient's insurance requested that we complete follow up questions.

## 2021-03-19 NOTE — Telephone Encounter (Signed)
Melanie from [facility] says she saw that the MRI was denied.  She wants to know if there is going to be a peer to peer review.  Please call her t [phone]  Ext [phone]  Thanks

## 2021-03-19 NOTE — Telephone Encounter (Signed)
Yes, I did do this. I had to call the PA dept and gave them the info needed. They were supposed to fax something to our office. If no fax available or seen, I can call them again.

## 2021-03-20 NOTE — Telephone Encounter (Signed)
Pt called back about the documentation and the PA/ she stated they denied the PA until provider calls [phone] and make a request for a Peer to peer review / ref# [id number]/ please advise asap / pt asked for call from nurse

## 2021-03-24 ENCOUNTER — Other Ambulatory Visit: Payer: Self-pay | Admitting: Family Medicine

## 2021-03-24 DIAGNOSIS — G8929 Other chronic pain: Secondary | ICD-10-CM

## 2021-03-24 DIAGNOSIS — M5442 Lumbago with sciatica, left side: Secondary | ICD-10-CM

## 2021-03-25 MED ORDER — OXYCODONE HCL 15 MG PO TABS: 15.0000 mg | ORAL_TABLET | Freq: Four times a day (QID) | ORAL | 0 refills | Status: DC

## 2021-03-30 ENCOUNTER — Encounter: Payer: Self-pay | Admitting: Emergency Medicine

## 2021-03-30 ENCOUNTER — Other Ambulatory Visit: Payer: Self-pay

## 2021-03-30 ENCOUNTER — Emergency Department
Admission: EM | Admit: 2021-03-30 | Discharge: 2021-03-30 | Disposition: A | Discharge status: Home / Self Care | Payer: Medicaid Other | Attending: Emergency Medicine | Admitting: Emergency Medicine

## 2021-03-30 ENCOUNTER — Emergency Department (HOSPITAL_COMMUNITY)
Admission: EM | Admit: 2021-03-30 | Discharge: 2021-03-31 | Disposition: A | Discharge status: Home / Self Care | Payer: Medicaid Other | Attending: Emergency Medicine | Admitting: Emergency Medicine

## 2021-03-30 DIAGNOSIS — X58XXXA Exposure to other specified factors, initial encounter: Secondary | ICD-10-CM | POA: Insufficient documentation

## 2021-03-30 DIAGNOSIS — L089 Local infection of the skin and subcutaneous tissue, unspecified: Secondary | ICD-10-CM | POA: Diagnosis not present

## 2021-03-30 DIAGNOSIS — S0081XA Abrasion of other part of head, initial encounter: Secondary | ICD-10-CM | POA: Insufficient documentation

## 2021-03-30 DIAGNOSIS — J45909 Unspecified asthma, uncomplicated: Secondary | ICD-10-CM | POA: Diagnosis not present

## 2021-03-30 DIAGNOSIS — S0990XA Unspecified injury of head, initial encounter: Secondary | ICD-10-CM | POA: Diagnosis present

## 2021-03-30 DIAGNOSIS — L03211 Cellulitis of face: Secondary | ICD-10-CM | POA: Insufficient documentation

## 2021-03-30 DIAGNOSIS — Z87891 Personal history of nicotine dependence: Secondary | ICD-10-CM | POA: Insufficient documentation

## 2021-03-30 DIAGNOSIS — S01402A Unspecified open wound of left cheek and temporomandibular area, initial encounter: Secondary | ICD-10-CM | POA: Insufficient documentation

## 2021-03-30 DIAGNOSIS — J449 Chronic obstructive pulmonary disease, unspecified: Secondary | ICD-10-CM | POA: Insufficient documentation

## 2021-03-30 LAB — CBC WITH DIFFERENTIAL/PLATELET
Abs Immature Granulocytes: 0.03 10*3/uL (ref 0.00–0.07)
Basophils Absolute: 0.1 10*3/uL (ref 0.0–0.1)
Basophils Relative: 1 %
Eosinophils Absolute: 0.2 10*3/uL (ref 0.0–0.5)
Eosinophils Relative: 2 %
HCT: 39 % (ref 36.0–46.0)
Hemoglobin: 13.6 g/dL (ref 12.0–15.0)
Immature Granulocytes: 0 %
Lymphocytes Relative: 20 %
Lymphs Abs: 1.7 10*3/uL (ref 0.7–4.0)
MCH: 30.5 pg (ref 26.0–34.0)
MCHC: 34.9 g/dL (ref 30.0–36.0)
MCV: 87.4 fL (ref 80.0–100.0)
Monocytes Absolute: 0.9 10*3/uL (ref 0.1–1.0)
Monocytes Relative: 10 %
Neutro Abs: 5.8 10*3/uL (ref 1.7–7.7)
Neutrophils Relative %: 67 %
Platelets: 295 10*3/uL (ref 150–400)
RBC: 4.46 MIL/uL (ref 3.87–5.11)
RDW: 13 % (ref 11.5–15.5)
WBC: 8.6 10*3/uL (ref 4.0–10.5)
nRBC: 0 % (ref 0.0–0.2)

## 2021-03-30 LAB — COMPREHENSIVE METABOLIC PANEL
ALT: 28 U/L (ref 0–44)
AST: 21 U/L (ref 15–41)
Albumin: 3.7 g/dL (ref 3.5–5.0)
Alkaline Phosphatase: 85 U/L (ref 38–126)
Anion gap: 7 (ref 5–15)
BUN: 16 mg/dL (ref 6–20)
CO2: 26 mmol/L (ref 22–32)
Calcium: 8.9 mg/dL (ref 8.9–10.3)
Chloride: 104 mmol/L (ref 98–111)
Creatinine, Ser: 0.69 mg/dL (ref 0.44–1.00)
GFR, Estimated: >60 mL/min (ref >60)
Glucose, Bld: 119 mg/dL — ABNORMAL HIGH (ref 70–99)
Potassium: 3.5 mmol/L (ref 3.5–5.1)
Sodium: 137 mmol/L (ref 135–145)
Total Bilirubin: 0.5 mg/dL (ref 0.3–1.2)
Total Protein: 6.9 g/dL (ref 6.5–8.1)

## 2021-03-30 LAB — URINALYSIS, COMPLETE (UACMP) WITH MICROSCOPIC
Bilirubin Urine: NEGATIVE
Glucose, UA: NEGATIVE mg/dL
Ketones, ur: NEGATIVE mg/dL
Leukocytes,Ua: NEGATIVE
Nitrite: NEGATIVE
Protein, ur: NEGATIVE mg/dL
Specific Gravity, Urine: 1.023 (ref 1.005–1.030)
pH: 5 (ref 5.0–8.0)

## 2021-03-30 MED ORDER — CLINDAMYCIN HCL 300 MG PO CAPS: 300.0000 mg | ORAL_CAPSULE | Freq: Three times a day (TID) | ORAL | 0 refills | Status: AC

## 2021-03-30 NOTE — ED Triage Notes (Signed)
Pt reports she did complete the abx given to her for her eye but then she cut a piece out with a knife this am. Swelling, redness and slight bruising noted.

## 2021-03-30 NOTE — ED Triage Notes (Signed)
Pt requested a urinalysis be completed. Pt reports foul odor to her urine.

## 2021-03-30 NOTE — ED Provider Notes (Signed)
[facility] Emergency Department Provider Note  Time seen: 10:37 AM  I have reviewed the triage vital signs and the nursing notes.   HISTORY  Chief Complaint Cellulitis   HPI Martha Guerra is a 40 y.o. female with a past medical history of anxiety, gastric reflux, presents to the emergency department for evaluation of a wound to her left face.  According to the patient she had an area of infection approximately 3 weeks ago she was seen in the emergency department for finish the course of antibiotics.  Patient states she thought there was something white trapped under the skin in that area so she used a sterile pair of scissors and attempted to get the little piece out of the scar.  Patient states she was squeezing it out but states the area became swollen so she was concerned and came to the emergency department.  Patient denies any fever.  No visual changes.   Past Medical History:  Diagnosis Date   Anxiety    Asthma    Chronic constipation    Cystitides, interstitial, chronic    Depression    Gastritis    GERD (gastroesophageal reflux disease)    History of kidney stones    History of ovarian cyst     Patient Active Problem List   Diagnosis Date Noted   Vitamin D deficiency 12/26/2018   Chronic bilateral low back pain with bilateral sciatica (Primary Area of Pain) (L>R) 12/20/2018   Chronic pain of both lower extremities (Secondary Area of Pain) disease LR 12/20/2018   Chronic neck pain (Tertiary Area of Pain) (R>L) 12/20/2018   Chronic female pelvic pain (Fourth Area of Pain) 12/20/2018   Long term current use of opiate analgesic 12/20/2018   Long term prescription benzodiazepine use 12/20/2018   Neck pain 08/30/2018   Lumbar herniated disc 08/30/2018   Insomnia 07/15/2015   H/O urinary stone 07/15/2015   Gastric catarrh 07/15/2015   Fibromyalgia 11/13/2014   Anxiety state 08/07/2014   GERD (gastroesophageal reflux disease) 08/07/2014    Compulsive tobacco user syndrome 08/07/2014   Major depressive disorder, single episode 08/07/2014   Constipation 08/07/2014   Tobacco use disorder 08/07/2014   Chronic obstructive pulmonary disease ([place]) 08/07/2014   Esophageal reflux 08/07/2014   Palpitations 06/13/2013   Hematuria 06/29/2012   Myalgia and myositis 06/29/2012   High-tone pelvic floor dysfunction 04/27/2012   Interstitial cystitis (chronic) without hematuria 08/19/2011   Dysuria 08/19/2011   Dyspareunia 08/19/2011   Nocturia 08/19/2011   Reduced libido 08/19/2011   Urinary urgency 08/19/2011    Past Surgical History:  Procedure Laterality Date   cystectomy     OVARIAN CYST SURGERY      Prior to Admission medications   Medication Sig Start Date End Date Taking? Authorizing Provider  alprazolam Duanne Moron) 2 MG tablet TAKE 1/2 TABLET BY MOUTH 4 TIMES DAILY MAX 2 TABLETS PER DAY 01/22/21   Birdie Sons, MD  amphetamine-dextroamphetamine (ADDERALL) 10 MG tablet Take by mouth.    [provider]  ELMIRON 100 MG capsule Take 100 mg by mouth 3 (three) times daily. 11/17/20   [provider]  lamoTRIgine (LAMICTAL) 100 MG tablet  07/16/15   [provider]  lidocaine (XYLOCAINE) 2 % jelly 1 application as needed.    [provider]  lidocaine (XYLOCAINE) 5 % ointment Apply 1-2 x a day as directed 06/16/18   [provider]  linaclotide (LINZESS) 145 MCG CAPS capsule Take 1 capsule (145 mcg  total) by mouth daily before breakfast. 02/23/20   Mar Daring, PA-C  omeprazole (PRILOSEC) 40 MG capsule TAKE 1 CAPSULE BY MOUTH ONCE DAILY 07/20/19   Birdie Sons, MD  oxyCODONE (ROXICODONE) 15 MG immediate release tablet Take 1 tablet (15 mg total) by mouth in the morning, at noon, in the evening, and at bedtime. 03/25/21   Birdie Sons, MD  topiramate (TOPAMAX) 50 MG tablet Take 50 mg by mouth daily.    Myer Haff, MD  TRINTELLIX 20 MG TABS  07/16/15   [provider]   Vitamin D, Ergocalciferol, (DRISDOL) 1.25 MG (50000 UNIT) CAPS capsule Take 1 capsule (50,000 Units total) by mouth every 7 (seven) days. 12/10/20   Birdie Sons, MD    Allergies  Allergen Reactions   Prednisone Shortness Of Breath   Acetaminophen Other (See Comments) and Nausea Only    Family History  Problem Relation Age of Onset   Heart attack Mother    Hypertension Father     Social History Social History   Tobacco Use   Smoking status: Former    Packs/day: 0.50    Years: 17.00    Pack years: 8.50    Types: Cigarettes    Quit date: 12/12/2018    Years since quitting: 2.2   Smokeless tobacco: Never   Tobacco comments:    started smoking age 78, 1/2-1 ppd, quit smoking 01/2018  Vaping Use   Vaping Use: Never used  Substance Use Topics   Alcohol use: No   Drug use: No    Review of Systems Constitutional: Negative for fever. Cardiovascular: Negative for chest pain. Respiratory: Negative for shortness of breath. Gastrointestinal: Negative for abdominal pain Skin: Area of swelling and small infection to left cheek. Neurological: Negative for headache All other ROS negative  ____________________________________________   PHYSICAL EXAM:  VITAL SIGNS: ED Triage Vitals  Enc Vitals Group     BP 03/30/21 0741 122/80     Pulse Rate 03/30/21 0741 (!) 112     Resp 03/30/21 0741 20     Temp 03/30/21 0741 98.5 F (36.9 C)     Temp Source 03/30/21 0741 Oral     SpO2 03/30/21 0741 97 %     Weight 03/30/21 0735 150 lb (68 kg)     Height 03/30/21 0735 5\' 5"  (1.651 m)     Head Circumference --      Peak Flow --      Pain Score 03/30/21 0735 4     Pain Loc --      Pain Edu? --      Excl. in [place]? --    Constitutional: Alert and oriented. Well appearing and in no distress. Eyes: Normal exam ENT      Head: Patient has an area of induration approximately 2 cm in diameter as well as a small area of abrasion to the middle of this indurated area, consistent with likely  developing infection      Mouth/Throat: Mucous membranes are moist. Cardiovascular: Normal rate, regular rhythm.  Respiratory: Normal respiratory effort without tachypnea nor retractions. Breath sounds are clear Gastrointestinal: Soft and nontender. No distention.  Musculoskeletal: Nontender with normal range of motion in all extremities.  Neurologic:  Normal speech and language. No gross focal neurologic deficits  Skin:  Skin is warm.  Area of induration and abrasion to the left cheek as described above. Psychiatric: Quite anxious appearing.  ____________________________________________   INITIAL IMPRESSION / ASSESSMENT AND PLAN / ED COURSE  Pertinent labs & imaging results that were available during my care of the patient were reviewed by me and considered in my medical decision making (see chart for details).   Patient presents to the emergency department for an area of possible infection to her left cheek.  Patient is very anxious, states the prior scab was healing but she thought something was under the scab so she used.  Sterile scissors to remove the scab and now it has become swollen once again.  This was yesterday.  Patient does admit she has been squeezing it to see if any infection or pus would come out.  Area is consistent with inflammation either from her squeezing it or from early infection where she had a scab that is now removed.  I had a long discussion with the patient regarding the need for her to not touch the area to allow it to heal.  I discussed using a very gentle face wash once per day without any abrasion or washcloths.  And covering the area with Neosporin once per day as well again emphasizing no abrasion squeezing, etc.  We will place the patient on antibiotic as a precaution.  Reassuringly patient labs are normal.  Patient agreeable to plan of care.  Janvi Ammar File was evaluated in Emergency Department on 03/30/2021 for the symptoms described in the history of  present illness. She was evaluated in the context of the global COVID-19 pandemic, which necessitated consideration that the patient might be at risk for infection with the SARS-CoV-2 virus that causes COVID-19. Institutional protocols and algorithms that pertain to the evaluation of patients at risk for COVID-19 are in a state of rapid change based on information released by regulatory bodies including the CDC and federal and state organizations. These policies and algorithms were followed during the patient's care in the ED.  ____________________________________________   FINAL CLINICAL IMPRESSION(S) / ED DIAGNOSES  Cellulitis   Harvest Dark, MD 03/30/21 1042

## 2021-03-30 NOTE — ED Triage Notes (Signed)
Pt came in with c/o L facial wound. Inferior to L eye. Pt was picking at her face due to anxiety. Pt states that she got seen for it today and placed on abx. She states that she felt a "milia" up under her skin so she stuck baby scissors into the wound up into her sinuses. She states that now she feels burning in ger nose. She has been squeezing the wound so her eye is swollen as well. Pt states her dad recently passed so she has been extremely sad.

## 2021-03-30 NOTE — ED Triage Notes (Signed)
Pt reports was seen here last month for an infection and a hole under her left eye. Pt reports her father died and is birthday was yesterday and she cried a lot and now the area is open again and she has redness and swelling.

## 2021-03-31 MED ORDER — BACITRACIN ZINC 500 UNIT/GM EX OINT: 1.0000 "application " | TOPICAL_OINTMENT | Freq: Two times a day (BID) | CUTANEOUS | 0 refills | Status: DC

## 2021-03-31 MED ORDER — BACITRACIN ZINC 500 UNIT/GM EX OINT
1.0000 "application " | TOPICAL_OINTMENT | Freq: Once | CUTANEOUS | Status: AC
  Administered 2021-03-31: 1 via TOPICAL
  Filled 2021-03-31: qty 0.9

## 2021-03-31 NOTE — Discharge Instructions (Addendum)
STOP PICKING AT [facility].  DO NOT STICK ANYTHING INTO THE WOUND.  IT WILL NOT HEAL IF YOU DO NOT LEAVE IT ALONE. Continue the antibiotics and can use the topical cream as directed. Follow-up with your primary care doctor in the next few days for re-check. Return here for new concerns.

## 2021-03-31 NOTE — ED Provider Notes (Signed)
[facility] DEPT Provider Note   CSN: [id number] Arrival date & time: 03/30/21  2321     History Chief Complaint  Patient presents with   Facial Injury   Anxiety    Jakai Onofre is a 40 y.o. female.  The history is provided by the patient and medical records.  Facial Injury Anxiety   39 y.o. F with hx of anxiety, asthma, depression, GERD, presenting to the ED for wound to the face.  She was seen for same earlier today at [facility] and prescribed clindamycin-- has had 1 dose thus far.  She states she went home and began picking at her face again and tried to use scissors to cut out a "milia" that she found under the skin.  States now area is burning more than before.  She admits she was told not to touch her face but she cannot control picking at it due to her anxiety and fact that she can see it in the mirror.  She states she is here because she wants the wound closed so it will heal faster.  She also expressed concern that "it is summer" and her face looks bad.  Has had increased anxiety due to father's death recently.  Past Medical History:  Diagnosis Date   Anxiety    Asthma    Chronic constipation    Cystitides, interstitial, chronic    Depression    Gastritis    GERD (gastroesophageal reflux disease)    History of kidney stones    History of ovarian cyst     Patient Active Problem List   Diagnosis Date Noted   Vitamin D deficiency 12/26/2018   Chronic bilateral low back pain with bilateral sciatica (Primary Area of Pain) (L>R) 12/20/2018   Chronic pain of both lower extremities (Secondary Area of Pain) disease LR 12/20/2018   Chronic neck pain (Tertiary Area of Pain) (R>L) 12/20/2018   Chronic female pelvic pain (Fourth Area of Pain) 12/20/2018   Long term current use of opiate analgesic 12/20/2018   Long term prescription benzodiazepine use 12/20/2018   Neck pain 08/30/2018   Lumbar herniated disc 08/30/2018   Insomnia 07/15/2015    H/O urinary stone 07/15/2015   Gastric catarrh 07/15/2015   Fibromyalgia 11/13/2014   Anxiety state 08/07/2014   GERD (gastroesophageal reflux disease) 08/07/2014   Compulsive tobacco user syndrome 08/07/2014   Major depressive disorder, single episode 08/07/2014   Constipation 08/07/2014   Tobacco use disorder 08/07/2014   Chronic obstructive pulmonary disease ([place]) 08/07/2014   Esophageal reflux 08/07/2014   Palpitations 06/13/2013   Hematuria 06/29/2012   Myalgia and myositis 06/29/2012   High-tone pelvic floor dysfunction 04/27/2012   Interstitial cystitis (chronic) without hematuria 08/19/2011   Dysuria 08/19/2011   Dyspareunia 08/19/2011   Nocturia 08/19/2011   Reduced libido 08/19/2011   Urinary urgency 08/19/2011    Past Surgical History:  Procedure Laterality Date   cystectomy     OVARIAN CYST SURGERY       OB History     Gravida  2   Para  1   Term  1   Preterm      AB  1   Living  1      SAB  1   IAB      Ectopic      Multiple      Live Births  1           Family History  Problem Relation Age of Onset  Heart attack Mother    Hypertension Father     Social History   Tobacco Use   Smoking status: Former    Packs/day: 0.50    Years: 17.00    Pack years: 8.50    Types: Cigarettes    Quit date: 12/12/2018    Years since quitting: 2.3   Smokeless tobacco: Never   Tobacco comments:    started smoking age 85, 1/2-1 ppd, quit smoking 01/2018  Vaping Use   Vaping Use: Never used  Substance Use Topics   Alcohol use: No   Drug use: No    Home Medications Prior to Admission medications   Medication Sig Start Date End Date Taking? Authorizing Provider  alprazolam Duanne Moron) 2 MG tablet TAKE 1/2 TABLET BY MOUTH 4 TIMES DAILY MAX 2 TABLETS PER DAY 01/22/21   Birdie Sons, MD  amphetamine-dextroamphetamine (ADDERALL) 10 MG tablet Take by mouth.    [provider]  clindamycin (CLEOCIN) 300 MG capsule Take 1 capsule (300 mg  total) by mouth 3 (three) times daily for 10 days. 03/30/21 04/09/21  Harvest Dark, MD  ELMIRON 100 MG capsule Take 100 mg by mouth 3 (three) times daily. 11/17/20   [provider]  lamoTRIgine (LAMICTAL) 100 MG tablet  07/16/15   [provider]  lidocaine (XYLOCAINE) 2 % jelly 1 application as needed.    [provider]  lidocaine (XYLOCAINE) 5 % ointment Apply 1-2 x a day as directed 06/16/18   [provider]  linaclotide (LINZESS) 145 MCG CAPS capsule Take 1 capsule (145 mcg total) by mouth daily before breakfast. 02/23/20   Marlyn Corporal, Clearnce Sorrel, PA-C  omeprazole (PRILOSEC) 40 MG capsule TAKE 1 CAPSULE BY MOUTH ONCE DAILY 07/20/19   Birdie Sons, MD  oxyCODONE (ROXICODONE) 15 MG immediate release tablet Take 1 tablet (15 mg total) by mouth in the morning, at noon, in the evening, and at bedtime. 03/25/21   Birdie Sons, MD  topiramate (TOPAMAX) 50 MG tablet Take 50 mg by mouth daily.    Myer Haff, MD  TRINTELLIX 20 MG TABS  07/16/15   [provider]  Vitamin D, Ergocalciferol, (DRISDOL) 1.25 MG (50000 UNIT) CAPS capsule Take 1 capsule (50,000 Units total) by mouth every 7 (seven) days. 12/10/20   Birdie Sons, MD    Allergies    Prednisone and Acetaminophen  Review of Systems   Review of Systems  Psychiatric/Behavioral:  The patient is nervous/anxious.   All other systems reviewed and are negative.  Physical Exam Updated Vital Signs BP (!) 145/85 (BP Location: Left Arm)   Pulse (!) 137   Temp 98.1 F (36.7 C) (Oral)   Resp (!) 22   LMP 03/05/2021 (Approximate)   SpO2 98%   Physical Exam Vitals and nursing note reviewed.  Constitutional:      Appearance: She is well-developed.     Comments: Eating blueberries when entering room, becomes increasingly anxious as I talk with her  HENT:     Head: Normocephalic and atraumatic.     Comments: Wound noted to left upper cheek, there is central hollowed out area with some dried  purulent drainage and adjacent abrasion, there is no open wound or laceration noted, there is no palpable FB beneath the skin, no wound into the nasal cavity, no appreciable abscess Eyes:     Conjunctiva/sclera: Conjunctivae normal.     Pupils: Pupils are equal, round, and reactive to light.  Cardiovascular:     Rate and  Rhythm: Normal rate and regular rhythm.     Heart sounds: Normal heart sounds.  Pulmonary:     Effort: Pulmonary effort is normal.     Breath sounds: Normal breath sounds.  Abdominal:     General: Bowel sounds are normal.     Palpations: Abdomen is soft.  Musculoskeletal:        General: Normal range of motion.     Cervical back: Normal range of motion.  Skin:    General: Skin is warm and dry.  Neurological:     Mental Status: She is alert and oriented to person, place, and time.  Psychiatric:        Mood and Affect: Mood is anxious.    ED Results / Procedures / Treatments   Labs (all labs ordered are listed, but only abnormal results are displayed) Labs Reviewed - No data to display  EKG None  Radiology No results found.  Procedures Procedures   Medications Ordered in ED Medications - No data to display  ED Course  I have reviewed the triage vital signs and the nursing notes.  Pertinent labs & imaging results that were available during my care of the patient were reviewed by me and considered in my medical decision making (see chart for details).    MDM Rules/Calculators/A&P  40 year old female presenting to the ED with wound of left upper cheek.  She was just seen at [facility] today for same and started on antibiotics, she has had 1 dose thus far.  Essentially, she went home and began digging in the area again with a pair of scissors because she found a "milia" under her skin.  States she is here because she wants to wound closed so will heal faster.  She is afebrile and nontoxic.  She initially is calm and eating blueberries when I entered the room  but became increasingly more anxious as I was talking with her.  States she stabbed into the area with a pair of scissors, however I do not appreciate any acute open wound to the face.  She actually has a somewhat crater appearing area to left upper cheek and adjacent abrasion which sounds nearly identical when compared with chart from earlier today.  There is no active bleeding from wound, dried drainage is present.  There is no wound into the nasal cavity.  No palpable abscess beneath the skin.  I had a long discussion with patient about need for her leave her face alone-- avoid picking at it with fingers or sticking any objects into the wound.  She is repeatedly asking for this to be closed which I do think is appropriate.  She is upset that her face will be "ugly" for summer, again reiterated that it will not heal if she does not leave it alone.  Will give her topical bacitracin and apply dressing to try to dissuade her picking at it.  She will need to follow-up with her PCP for wound check in the next few days.  Return here for new concerns.  Final Clinical Impression(s) / ED Diagnoses Final diagnoses:  Wound infection    Rx / DC Orders ED Discharge Orders     None        Larene Pickett, PA-C 03/31/21 0111    Ripley Fraise, MD 03/31/21 2321

## 2021-04-01 ENCOUNTER — Telehealth: Payer: Self-pay

## 2021-04-01 NOTE — Telephone Encounter (Signed)
Transition Care Management Unsuccessful Follow-up Telephone Call  Date of discharge and from where:  03/30/2021 to 03/31/2021 from [facility]  Attempts:  1st Attempt  Reason for unsuccessful TCM follow-up call:  Left voice message

## 2021-04-02 DIAGNOSIS — L700 Acne vulgaris: Secondary | ICD-10-CM | POA: Diagnosis not present

## 2021-04-02 DIAGNOSIS — B999 Unspecified infectious disease: Secondary | ICD-10-CM | POA: Diagnosis not present

## 2021-04-02 NOTE — Telephone Encounter (Signed)
Transition Care Management Unsuccessful Follow-up Telephone Call  Date of discharge and from where:  03/31/2021 from [facility]  Attempts:  2nd Attempt  Reason for unsuccessful TCM follow-up call:  Left voice message

## 2021-04-03 NOTE — Telephone Encounter (Signed)
Transition Care Management Unsuccessful Follow-up Telephone Call  Date of discharge and from where:  03/31/2021 from [facility]  Attempts:  3rd Attempt  Reason for unsuccessful TCM follow-up call:  Unable to reach patient

## 2021-04-14 DIAGNOSIS — L2081 Atopic neurodermatitis: Secondary | ICD-10-CM | POA: Diagnosis not present

## 2021-04-22 ENCOUNTER — Other Ambulatory Visit: Payer: Self-pay | Admitting: Family Medicine

## 2021-04-22 DIAGNOSIS — M5442 Lumbago with sciatica, left side: Secondary | ICD-10-CM

## 2021-04-22 NOTE — Telephone Encounter (Signed)
Last dispensed 30 days supply 03-25-2021

## 2021-04-23 MED ORDER — OXYCODONE HCL 15 MG PO TABS: 15.0000 mg | ORAL_TABLET | Freq: Four times a day (QID) | ORAL | 0 refills | Status: DC

## 2021-05-21 ENCOUNTER — Other Ambulatory Visit: Payer: Self-pay | Admitting: Family Medicine

## 2021-05-21 DIAGNOSIS — G8929 Other chronic pain: Secondary | ICD-10-CM

## 2021-05-21 DIAGNOSIS — M5442 Lumbago with sciatica, left side: Secondary | ICD-10-CM

## 2021-05-22 MED ORDER — OXYCODONE HCL 15 MG PO TABS: 15.0000 mg | ORAL_TABLET | Freq: Four times a day (QID) | ORAL | 0 refills | Status: DC

## 2021-06-19 ENCOUNTER — Other Ambulatory Visit: Payer: Self-pay | Admitting: Family Medicine

## 2021-06-19 DIAGNOSIS — M5442 Lumbago with sciatica, left side: Secondary | ICD-10-CM

## 2021-06-19 DIAGNOSIS — G8929 Other chronic pain: Secondary | ICD-10-CM

## 2021-06-19 NOTE — Telephone Encounter (Signed)
done

## 2021-06-20 MED ORDER — OXYCODONE HCL 15 MG PO TABS: 15.0000 mg | ORAL_TABLET | Freq: Four times a day (QID) | ORAL | 0 refills | Status: DC

## 2021-07-14 ENCOUNTER — Other Ambulatory Visit: Payer: Self-pay | Admitting: Family Medicine

## 2021-07-14 DIAGNOSIS — F411 Generalized anxiety disorder: Secondary | ICD-10-CM

## 2021-07-15 NOTE — Telephone Encounter (Signed)
Requested medications are due for refill today this states filled 07/14/21 (yesterday)  Requested medications are on the active medication list yes  Last refill 07/14/21  Last visit 12/02/20  Future visit scheduled 08/01/21  Notes to clinic This med is not delegated, the urine drug screen is more than 360 days ago, visit was more than 6 months ago, does have an upcoming visit in Oct., please assess.

## 2021-07-18 ENCOUNTER — Other Ambulatory Visit: Payer: Self-pay | Admitting: Family Medicine

## 2021-07-18 DIAGNOSIS — M5442 Lumbago with sciatica, left side: Secondary | ICD-10-CM

## 2021-07-18 DIAGNOSIS — G8929 Other chronic pain: Secondary | ICD-10-CM

## 2021-07-18 MED ORDER — OXYCODONE HCL 15 MG PO TABS: 15.0000 mg | ORAL_TABLET | Freq: Four times a day (QID) | ORAL | 0 refills | Status: DC

## 2021-07-29 DIAGNOSIS — F3189 Other bipolar disorder: Secondary | ICD-10-CM | POA: Diagnosis not present

## 2021-07-29 DIAGNOSIS — F410 Panic disorder [episodic paroxysmal anxiety] without agoraphobia: Secondary | ICD-10-CM | POA: Diagnosis not present

## 2021-07-29 DIAGNOSIS — F900 Attention-deficit hyperactivity disorder, predominantly inattentive type: Secondary | ICD-10-CM | POA: Diagnosis not present

## 2021-08-01 ENCOUNTER — Ambulatory Visit: Payer: Medicaid Other | Admitting: Family Medicine

## 2021-08-14 ENCOUNTER — Other Ambulatory Visit: Payer: Self-pay | Admitting: Family Medicine

## 2021-08-14 DIAGNOSIS — G8929 Other chronic pain: Secondary | ICD-10-CM

## 2021-08-14 MED ORDER — LIDOCAINE 5 % EX OINT: TOPICAL_OINTMENT | Freq: Two times a day (BID) | CUTANEOUS | 3 refills | Status: DC | PRN

## 2021-08-14 MED ORDER — OXYCODONE HCL 15 MG PO TABS: 15.0000 mg | ORAL_TABLET | Freq: Four times a day (QID) | ORAL | 0 refills | Status: DC

## 2021-08-19 ENCOUNTER — Telehealth (INDEPENDENT_AMBULATORY_CARE_PROVIDER_SITE_OTHER): Payer: Medicaid Other | Admitting: Family Medicine

## 2021-08-19 DIAGNOSIS — Z91199 Patient's noncompliance with other medical treatment and regimen due to unspecified reason: Secondary | ICD-10-CM

## 2021-08-19 NOTE — Progress Notes (Signed)
    MyChart Video Visit    Virtual Visit via Video Note   This visit type was conducted due to national recommendations for restrictions regarding the COVID-19 Pandemic (e.g. social distancing) in an effort to limit this patient's exposure and mitigate transmission in our community. This patient is at least at moderate risk for complications without adequate follow up. This format is felt to be most appropriate for this patient at this time. Physical exam was limited by quality of the video and audio technology used for the visit.   Waiting in virtual waiting room for 20 minutes after scheduled appointment time and patient did not login. Multiple telephone calls were attempted by CMA to contact patient and all went to voice mail which was full.

## 2021-08-19 NOTE — Patient Instructions (Signed)
.   Please review the attached list of medications and notify my office if there are any errors.   . Please bring all of your medications to every appointment so we can make sure that our medication list is the same as yours.   

## 2021-09-10 ENCOUNTER — Other Ambulatory Visit: Payer: Self-pay | Admitting: Family Medicine

## 2021-09-10 DIAGNOSIS — G8929 Other chronic pain: Secondary | ICD-10-CM

## 2021-09-10 DIAGNOSIS — M5442 Lumbago with sciatica, left side: Secondary | ICD-10-CM

## 2021-09-12 MED ORDER — OXYCODONE HCL 15 MG PO TABS: 15.0000 mg | ORAL_TABLET | Freq: Four times a day (QID) | ORAL | 0 refills | Status: DC

## 2021-10-09 ENCOUNTER — Other Ambulatory Visit: Payer: Self-pay | Admitting: Family Medicine

## 2021-10-09 DIAGNOSIS — M5442 Lumbago with sciatica, left side: Secondary | ICD-10-CM

## 2021-10-09 MED ORDER — OXYCODONE HCL 15 MG PO TABS: 15.0000 mg | ORAL_TABLET | Freq: Four times a day (QID) | ORAL | 0 refills | Status: DC

## 2021-10-21 ENCOUNTER — Ambulatory Visit: Payer: Medicaid Other | Admitting: Family Medicine

## 2021-10-21 NOTE — Progress Notes (Deleted)
°  ° ° °  Established patient visit   Patient: Martha Guerra   DOB: 1981/08/17   41 y.o. Female  MRN: [id number] Visit Date: 10/21/2021  Today's healthcare provider: Lelon Huh, MD   No chief complaint on file.  Subjective    HPI  Hair loss:  Medications: Outpatient Medications Prior to Visit  Medication Sig   alprazolam (XANAX) 2 MG tablet TAKE 1/2 TABLET BY MOUTH 4 TIMES DAILY MAX 2 TABLETS PER DAY   amphetamine-dextroamphetamine (ADDERALL) 10 MG tablet Take by mouth.   bacitracin ointment Apply 1 application topically 2 (two) times daily.   ELMIRON 100 MG capsule Take 100 mg by mouth 3 (three) times daily.   lamoTRIgine (LAMICTAL) 100 MG tablet    lidocaine (XYLOCAINE) 2 % jelly 1 application as needed.   lidocaine (XYLOCAINE) 5 % ointment Apply topically every 12 (twelve) hours as needed.   linaclotide (LINZESS) 145 MCG CAPS capsule Take 1 capsule (145 mcg total) by mouth daily before breakfast.   omeprazole (PRILOSEC) 40 MG capsule TAKE 1 CAPSULE BY MOUTH ONCE DAILY   oxyCODONE (ROXICODONE) 15 MG immediate release tablet Take 1 tablet (15 mg total) by mouth in the morning, at noon, in the evening, and at bedtime.   topiramate (TOPAMAX) 50 MG tablet Take 50 mg by mouth daily.   TRINTELLIX 20 MG TABS    Vitamin D, Ergocalciferol, (DRISDOL) 1.25 MG (50000 UNIT) CAPS capsule Take 1 capsule (50,000 Units total) by mouth every 7 (seven) days.   No facility-administered medications prior to visit.    Review of Systems  {Labs   Heme   Chem   Endocrine   Serology   Results Review (optional):23779}   Objective    There were no vitals taken for this visit. {Show previous vital signs (optional):23777}  Physical Exam  ***  No results found for any visits on 10/21/21.  Assessment & Plan     ***  No follow-ups on file.      {provider attestation***:1}   Lelon Huh, MD  [facility] [phone] (phone) [phone] (fax)  [place]

## 2021-11-10 ENCOUNTER — Other Ambulatory Visit: Payer: Self-pay | Admitting: Family Medicine

## 2021-11-10 DIAGNOSIS — G8929 Other chronic pain: Secondary | ICD-10-CM

## 2021-11-10 NOTE — Telephone Encounter (Signed)
Pt called to clarify that her message is unrelated to the Rx request, please advise and call patient back.   Best contact: [phone]

## 2021-11-10 NOTE — Telephone Encounter (Signed)
Requested medications are due for refill today.  yes  Requested medications are on the active medications list.  yes  Last refill. 10/09/2021  Future visit scheduled.   yes  Notes to clinic.  Medication not delegated. Refill already requested earlier today.    Requested Prescriptions  Pending Prescriptions Disp Refills   oxyCODONE (ROXICODONE) 15 MG immediate release tablet [Pharmacy Med Name: OXYCODONE HCL 15 MG TAB] 120 tablet     Sig: TAKE 1 TABLET BY MOUTH ONCE EVERY MORNING  AT NOON ONCE EVERY EVENING AND AT BEDTIME     Not Delegated - Analgesics:  Opioid Agonists Failed - 11/10/2021  3:56 PM      Failed - This refill cannot be delegated      Failed - Urine Drug Screen completed in last 360 days      Passed - Valid encounter within last 6 months    Recent Outpatient Visits           2 months ago No-show for appointment   [place], MD   11 months ago Right lower quadrant abdominal pain   [facility] Birdie Sons, MD   1 year ago Chronic bilateral low back pain with bilateral sciatica (Primary Area of Pain) (L>R)   [facility] Birdie Sons, MD   1 year ago Lumbar herniated disc   [facility] [place], Clearnce Sorrel, [place]   2 years ago Crum, Donald E, MD       Future Appointments             In 1 week Thedore Mins, Ria Comment, PA-C [employer], [employer]   In 2 months Fisher, Kirstie Peri, MD [facility], [place]

## 2021-11-11 MED ORDER — OXYCODONE HCL 15 MG PO TABS: 15.0000 mg | ORAL_TABLET | Freq: Four times a day (QID) | ORAL | 0 refills | Status: DC

## 2021-11-19 ENCOUNTER — Telehealth: Payer: Self-pay | Admitting: Family Medicine

## 2021-11-19 NOTE — Telephone Encounter (Signed)
Tried calling patient. Left message to call back. OK for [facility] triage to advise. It looks like patient has an appointment with another provider here in our office Ria Comment [place]) on 11/21/2021 for an evaluation of an acute issue. Patient will still need to schedule a separate  appointment to see Dr. Caryn Section for her pain medication follow up since he is the one who prescribes her pain medication.

## 2021-11-19 NOTE — Telephone Encounter (Signed)
Received PA from her insurance regarding oxycodone. PA requires recent office notes documenting medical necessity, but she has not been seen since 03/2020. Needs office visit in order to be able to complete PA.

## 2021-11-20 NOTE — Telephone Encounter (Signed)
Patient called and advised of the message below from Dr. Caryn Section, she verbalized understanding and says as long as she can be scheduled with Dr. Caryn Section before 12/11/21. She says her Oxycodone is not due until then. I advised I will send this to Dr. Caryn Section and someone will call with the appointment. She says she still wants to keep the appointment tomorrow with Ria Comment because she feels more comfortable with a female for that appointment.

## 2021-11-20 NOTE — Telephone Encounter (Signed)
Tried calling patient. Left message to call back. OK for [facility] triage to advise.  ?

## 2021-11-21 ENCOUNTER — Other Ambulatory Visit: Payer: Self-pay

## 2021-11-21 ENCOUNTER — Encounter: Payer: Self-pay | Admitting: Physician Assistant

## 2021-11-21 ENCOUNTER — Ambulatory Visit: Payer: Medicaid Other | Admitting: Physician Assistant

## 2021-11-21 VITALS — BP 124/85 | HR 102 | Ht 64.0 in | Wt 173.1 lb

## 2021-11-21 DIAGNOSIS — F322 Major depressive disorder, single episode, severe without psychotic features: Secondary | ICD-10-CM

## 2021-11-21 DIAGNOSIS — R6882 Decreased libido: Secondary | ICD-10-CM | POA: Diagnosis not present

## 2021-11-21 DIAGNOSIS — N951 Menopausal and female climacteric states: Secondary | ICD-10-CM | POA: Diagnosis not present

## 2021-11-21 DIAGNOSIS — R21 Rash and other nonspecific skin eruption: Secondary | ICD-10-CM

## 2021-11-21 DIAGNOSIS — R232 Flushing: Secondary | ICD-10-CM

## 2021-11-21 NOTE — Progress Notes (Signed)
I,Sha'taria Tyson,acting as a Education administrator for [employer], PA-C.,have documented all relevant documentation on the behalf of Martha Kirschner, PA-C,as directed by  Martha Kirschner, PA-C while in the presence of Martha Kirschner, PA-C.   Established patient visit   Patient: Martha Guerra   DOB: 21-May-1981   41 y.o. Female  MRN: [id number] Visit Date: 11/21/2021  Today's healthcare provider: [name], PA-C   Cc. Change in mood, hot flashes, numbness  Subjective    HPI   Cordie is a 41 y/o female who presents today with concerns of side effects from Zoloft and hormonal changes. She currently takes Lamictal, Trintellix, Xanax, Adderall and Zoloft managed by her Psychiatrist Dr Myer Haff. She reports over the summer/last spring she was having dramatic crying spells, and Dr Carlos Levering started her on Zoloft. She now only has these severe sadness/crying spells 0-1 times a month instead of multiple times a month. She reports though, feeling numb all over ever since starting the Zoloft. She reports having no interest in sex, and when she does, feels numb 'down there' and is unable to obtain an orgasm.   She has also felt over the past few months hot flashes--she describes once having to strip to her underwear and lie outside in the cold to feel better. These happen a few times a month.  She also describes feeling easier to anger/irritation than usual, which is out of her character. She is wondering if she is going through menopause and this is the cause of the sexual dysfunction/hot flashes/mood swings.  Her LMP was 1/19, and she also menstruated Dec 28. Reports menstrual cycles are more irregular, but she has not skipped one.   She also reports having a 'skin condition' which she picks at, currently this is flared. Requests derm referral.   Reports having depressive symptoms that make her feel like she does not want to live, but denies actual SI, plan to SI. Adamant that she would never commit  suicide as she supports her son and has lost family to suicide and knows how it feels to be left behind.  PHQ9 SCORE ONLY 11/21/2021 12/02/2020 12/20/2018  PHQ-9 Total Score 20 15 16     Medications: Outpatient Medications Prior to Visit  Medication Sig   alprazolam (XANAX) 2 MG tablet TAKE 1/2 TABLET BY MOUTH 4 TIMES DAILY MAX 2 TABLETS PER DAY   amphetamine-dextroamphetamine (ADDERALL) 10 MG tablet Take by mouth.   bacitracin ointment Apply 1 application topically 2 (two) times daily.   ELMIRON 100 MG capsule Take 100 mg by mouth 3 (three) times daily.   lamoTRIgine (LAMICTAL) 100 MG tablet    lidocaine (XYLOCAINE) 2 % jelly 1 application as needed.   lidocaine (XYLOCAINE) 5 % ointment Apply topically every 12 (twelve) hours as needed.   linaclotide (LINZESS) 145 MCG CAPS capsule Take 1 capsule (145 mcg total) by mouth daily before breakfast.   omeprazole (PRILOSEC) 40 MG capsule TAKE 1 CAPSULE BY MOUTH ONCE DAILY   oxyCODONE (ROXICODONE) 15 MG immediate release tablet Take 1 tablet (15 mg total) by mouth in the morning, at noon, in the evening, and at bedtime.   topiramate (TOPAMAX) 50 MG tablet Take 50 mg by mouth daily.   TRINTELLIX 20 MG TABS    Vitamin D, Ergocalciferol, (DRISDOL) 1.25 MG (50000 UNIT) CAPS capsule Take 1 capsule (50,000 Units total) by mouth every 7 (seven) days.   No facility-administered medications prior to visit.    Review of Systems  Constitutional:  Negative for fatigue and fever.  Respiratory:  Negative for cough and shortness of breath.   Cardiovascular:  Negative for chest pain and leg swelling.  Gastrointestinal:  Negative for abdominal pain.  Skin:  Positive for rash.  Neurological:  Negative for dizziness and headaches.  Psychiatric/Behavioral:  Positive for agitation. Negative for self-injury and suicidal ideas. The patient is nervous/anxious and is hyperactive.      Objective    There were no vitals taken for this visit. BP Readings from Last 3  Encounters:  03/30/21 (!) 145/85  03/30/21 128/88  03/07/21 112/71   Wt Readings from Last 3 Encounters:  03/30/21 150 lb (68 kg)  03/07/21 150 lb (68 kg)  12/02/20 158 lb (71.7 kg)      Physical Exam Constitutional:      General: She is awake.     Appearance: She is well-developed.  HENT:     Head: Normocephalic.  Eyes:     Conjunctiva/sclera: Conjunctivae normal.  Cardiovascular:     Rate and Rhythm: Normal rate and regular rhythm.     Heart sounds: Normal heart sounds.  Pulmonary:     Effort: Pulmonary effort is normal.     Breath sounds: Normal breath sounds.  Skin:    General: Skin is warm.     Findings: Rash and wound present.     Comments: Pt has many scabbed lesions on her face, and lower extremities. Due to nature of visit did not do a full skin exam.  Neurological:     Mental Status: She is alert and oriented to person, place, and time.  Psychiatric:        Attention and Perception: Attention normal.        Mood and Affect: Mood is anxious.        Speech: Speech normal.        Behavior: Behavior is cooperative.        Thought Content: Thought content does not include suicidal ideation. Thought content does not include suicidal plan.        Judgment: Judgment is impulsive.     No results found for any visits on 11/21/21.  Assessment & Plan     Problem List Items Addressed This Visit       Musculoskeletal and Integument   Rash    It does appear she picks at this unspecified 'skin condition', apparent on her face and what I am able to see of b/l lower extremities. Will send derm referral per pt preference.       Relevant Orders   Ambulatory referral to Dermatology     Other   Major depressive disorder, single episode - Primary    In her problem list we only have MDD and anxiety state recorded, likely more indepth psych history than this.   I discussed with her at length that peri-menopausal symptoms are normal, what to expect, how to manage. Agreed  to checking hormone levels, but I did advise there are wide ranges of normal and as she is still menstruating she will not be in a menopausal range.  I advised her that as we at [facility] do not manage her psychiatric meds, I cannot make changes at this time, but with her consent we will reach out to Dr. Ephriam Knuckles office to obtain records and see his plan and recommendations.  Ultimately I recommended to her she reach out to Dr. Ephriam Knuckles office to make an appointment to discuss how she feels on Zoloft to see if this  is the most appropriate medication for her or if he is able to make changes.       Relevant Medications   sertraline (ZOLOFT) 100 MG tablet   Reduced libido    This w/ numbness likely SE of Zoloft. She had questions to hormonal creams that may help with libido.  It could be an option to try Estrace once we obtain records from Dr Ephriam Knuckles office and understand her history more thoroughly.       Other Visit Diagnoses     Perimenopausal       Relevant Orders   TSH + free T4 (Completed)   Prolactin (Completed)   FSH (Completed)   Hot flashes       Relevant Orders   TSH + free T4 (Completed)   Prolactin (Completed)   FSH (Completed)        I, Martha Kirschner, PA-C have reviewed all documentation for this visit. The documentation on  11/21/2021  for the exam, diagnosis, procedures, and orders are all accurate and complete.    Martha Kirschner, PA-C  [facility] [phone] (phone) [phone] (fax)  [place]

## 2021-11-21 NOTE — Telephone Encounter (Signed)
Appt scheduled for 11/28/2021

## 2021-11-22 LAB — PROLACTIN: Prolactin: 11.6 ng/mL (ref 4.8–23.3)

## 2021-11-22 LAB — TSH+FREE T4
Free T4: 1.11 ng/dL (ref 0.82–1.77)
TSH: 0.868 u[IU]/mL (ref 0.450–4.500)

## 2021-11-22 LAB — FOLLICLE STIMULATING HORMONE: FSH: 8.7 m[IU]/mL

## 2021-11-24 ENCOUNTER — Encounter: Payer: Self-pay | Admitting: Physician Assistant

## 2021-11-24 DIAGNOSIS — R21 Rash and other nonspecific skin eruption: Secondary | ICD-10-CM | POA: Insufficient documentation

## 2021-11-24 NOTE — Assessment & Plan Note (Signed)
It does appear she picks at this unspecified 'skin condition', apparent on her face and what I am able to see of b/l lower extremities. Will send derm referral per pt preference.

## 2021-11-24 NOTE — Assessment & Plan Note (Signed)
This w/ numbness likely SE of Zoloft. She had questions to hormonal creams that may help with libido.  It could be an option to try Estrace once we obtain records from Dr Ephriam Knuckles office and understand her history more thoroughly.

## 2021-11-24 NOTE — Assessment & Plan Note (Signed)
In her problem list we only have MDD and anxiety state recorded, likely more indepth psych history than this.   I discussed with her at length that peri-menopausal symptoms are normal, what to expect, how to manage. Agreed to checking hormone levels, but I did advise there are wide ranges of normal and as she is still menstruating she will not be in a menopausal range.  I advised her that as we at [facility] do not manage her psychiatric meds, I cannot make changes at this time, but with her consent we will reach out to Dr. Ephriam Knuckles office to obtain records and see his plan and recommendations.  Ultimately I recommended to her she reach out to Dr. Ephriam Knuckles office to make an appointment to discuss how she feels on Zoloft to see if this is the most appropriate medication for her or if he is able to make changes.

## 2021-11-25 ENCOUNTER — Ambulatory Visit: Payer: Self-pay

## 2021-11-25 NOTE — Telephone Encounter (Signed)
°  Chief Complaint: advice Symptoms: wanting zoloft changed to effexor and vaginal cream prescribed Frequency: NA Pertinent Negatives: NA Disposition: [] ED /[] Urgent Care (no appt availability in office) / [] Appointment(In office/virtual)/ []  [place] [facility]/ [] Home Care/ [] Refused Recommended Disposition /[]  Mobile Bus/ [x]  Follow-up with PCP Additional Notes: Advised pt of Lindsey's message on lab results. Pt is asking if she was going to prescribed medications mentioned above and advised her to reach out to Dr. Ephriam Knuckles office. Pt states they are too far out for an appt for her to wait but she would call his office now to get medical records requested sent over and if he can prescribe Effexor. Pt is wanting to see if vaginal cream and go ahead and be sent to pharmacy.    Reason for Disposition  [1] Caller requesting NON-URGENT health information AND [2] PCP's office is the best resource  Answer Assessment - Initial Assessment Questions 1. REASON FOR CALL or QUESTION: "What is your reason for calling today?" or "How can I best help you?" or "What question do you have that I can help answer?"     Wanted to know where we were at with prescribing Effoxor and vaginal cream  Protocols used: Information Only Call - No Triage-A-AH

## 2021-11-28 ENCOUNTER — Ambulatory Visit: Payer: Medicaid Other | Admitting: Family Medicine

## 2021-11-28 ENCOUNTER — Encounter: Payer: Self-pay | Admitting: Family Medicine

## 2021-11-28 ENCOUNTER — Other Ambulatory Visit: Payer: Self-pay

## 2021-11-28 VITALS — BP 121/67 | HR 121 | Temp 97.8°F | Resp 20 | Wt 172.0 lb

## 2021-11-28 DIAGNOSIS — K5900 Constipation, unspecified: Secondary | ICD-10-CM

## 2021-11-28 DIAGNOSIS — N951 Menopausal and female climacteric states: Secondary | ICD-10-CM

## 2021-11-28 DIAGNOSIS — F1190 Opioid use, unspecified, uncomplicated: Secondary | ICD-10-CM

## 2021-11-28 DIAGNOSIS — F5231 Female orgasmic disorder: Secondary | ICD-10-CM

## 2021-11-28 DIAGNOSIS — N3010 Interstitial cystitis (chronic) without hematuria: Secondary | ICD-10-CM

## 2021-11-28 DIAGNOSIS — G894 Chronic pain syndrome: Secondary | ICD-10-CM | POA: Diagnosis not present

## 2021-11-28 DIAGNOSIS — M5441 Lumbago with sciatica, right side: Secondary | ICD-10-CM

## 2021-11-28 DIAGNOSIS — M5442 Lumbago with sciatica, left side: Secondary | ICD-10-CM | POA: Diagnosis not present

## 2021-11-28 DIAGNOSIS — Z780 Asymptomatic menopausal state: Secondary | ICD-10-CM

## 2021-11-28 DIAGNOSIS — G8929 Other chronic pain: Secondary | ICD-10-CM

## 2021-11-28 MED ORDER — VENLAFAXINE HCL ER 37.5 MG PO CP24: 37.5000 mg | ORAL_CAPSULE | Freq: Every day | ORAL | 0 refills | Status: DC

## 2021-11-28 NOTE — Patient Instructions (Addendum)
Please review the attached list of medications and notify my office if there are any errors.   Please bring all of your medications to every appointment so we can make sure that our medication list is the same as yours.   You can try OTC Black Cohosh to help with menopausal symptoms  Wean off of sertraline by taking 1/2 tablet for 1-2 weeks, then 1/2 tablet every other day for 1-2 weeks.

## 2021-11-28 NOTE — Progress Notes (Signed)
I,Roshena L Chambers,acting as a scribe for Lelon Huh, MD.,have documented all relevant documentation on the behalf of Lelon Huh, MD,as directed by  Lelon Huh, MD while in the presence of Lelon Huh, MD.    Established patient visit   Patient: Martha Guerra   DOB: 1980-11-06   41 y.o. Female  MRN: [id number] Visit Date: 11/28/2021  Today's healthcare provider: Lelon Huh, MD   Chief Complaint  Patient presents with   Pain Management   Back Pain   Subjective    HPI    Follow up for chronic bilateral low back pain:  The patient was last seen for this more than 1 year ago.   Changes made at last visit include reducing  oxyCODONE (ROXICODONE) to 15 MG immediate release tablet; Take 1 tablet (15 mg total) by mouth in the morning, at noon, in the evening, and at bedtime. Discussed referral to pain clinic.   She reports good compliance with treatment. She feels that condition is Unchanged. She is not having side effects.   She was initially prescribed .oxycodone many years ago for chronic interstitial cystitis for which she continues to see Dr. Amalia Hailey. Since then she has developed herniated lumbar and cervical disks causing back and neck pain radiating to her arms and legs. She has significant pain relief on her current medication regiment.    She also reports severe hot flashes for several months which became much worse after Dr. Kasandra Knudsen started her on sertraline. She has heard that there are other antidepressants like Effexor than can help with hot flashes so she stopped taking the sertraline this week and has been trying to get in touch with Dr. Kasandra Knudsen to start Effexor.   She also reports several months with reduced skin and genital sensitive and can no longer have orgasms. She read that there are some creams for that that she would like tot try.  -----------------------------------------------------------------------------------------   Medications: Outpatient  Medications Prior to Visit  Medication Sig   alprazolam (XANAX) 2 MG tablet TAKE 1/2 TABLET BY MOUTH 4 TIMES DAILY MAX 2 TABLETS PER DAY   amphetamine-dextroamphetamine (ADDERALL) 10 MG tablet Take by mouth.   bacitracin ointment Apply 1 application topically 2 (two) times daily.   lamoTRIgine (LAMICTAL) 100 MG tablet    lidocaine (XYLOCAINE) 2 % jelly 1 application as needed.   lidocaine (XYLOCAINE) 5 % ointment Apply topically every 12 (twelve) hours as needed.   oxyCODONE (ROXICODONE) 15 MG immediate release tablet Take 1 tablet (15 mg total) by mouth in the morning, at noon, in the evening, and at bedtime.   TRINTELLIX 20 MG TABS    Vitamin D, Ergocalciferol, (DRISDOL) 1.25 MG (50000 UNIT) CAPS capsule Take 1 capsule (50,000 Units total) by mouth every 7 (seven) days.   linaclotide (LINZESS) 145 MCG CAPS capsule Take 1 capsule (145 mcg total) by mouth daily before breakfast.   omeprazole (PRILOSEC) 40 MG capsule TAKE 1 CAPSULE BY MOUTH ONCE DAILY   [DISCONTINUED] sertraline (ZOLOFT) 100 MG tablet Take 100 mg by mouth daily. (Patient not taking: Reported on 11/28/2021)   No facility-administered medications prior to visit.    Review of Systems  Constitutional:  Negative for appetite change, chills, fatigue and fever.  Respiratory:  Negative for chest tightness and shortness of breath.   Cardiovascular:  Negative for chest pain and palpitations.  Gastrointestinal:  Negative for abdominal pain, nausea and vomiting.  Musculoskeletal:  Positive for back pain.  Neurological:  Negative for  dizziness and weakness.      Objective    BP 121/67 (BP Location: Right Arm, Patient Position: Sitting, Cuff Size: Normal)    Pulse (!) 121    Temp 97.8 F (36.6 C) (Oral)    Resp 20    Wt 172 lb (78 kg)    SpO2 100% Comment: room air   BMI 29.52 kg/m  {Show previous vital signs (optional):23777}  Physical Exam   General appearance:  Well developed, well nourished female, cooperative and in no  acute distress Head: Normocephalic, without obvious abnormality, atraumatic Respiratory: Respirations even and unlabored, normal respiratory rate Extremities: All extremities are intact.  Skin: Skin color, texture, turgor normal. No rashes seen  Psych: Appropriate mood and affect. Neurologic: Mental status: Alert, oriented to person, place, and time, thought content appropriate.    Assessment & Plan     1. Chronic, continuous use of opioids  - Pain Mgt Scrn (14 Drugs), Ur  Has multiple painful chronic medical conditions that are relatively well controlled on current pain medication regiment. Will continue unchanged after reviewing UDS.  2. Chronic bilateral low back pain with bilateral sciatica (Primary Area of Pain) (L>R)   3. Interstitial cystitis (chronic) without hematuria Pain associated with condition stable on current medications, she is to continue regular follow up with her urologist.   4. Chronic pain disorder   5. Menopausal symptoms She thinks this is much worse since being put on sertraline so she she stopped taking the medication earlier this week. Advised that sertraline needs to be weaned over 2-4 weeks. She has been trying to contact dr. Kasandra Knudsen to start her on Effexor which she heard helps with menopausal sx. Will have her start on trial of low starting dose, but advised she will need to discuss long term prescription with Dr. Kasandra Knudsen is treating her mood disorder.  - venlafaxine XR (EFFEXOR XR) 37.5 MG 24 hr capsule; Take 1 capsule (37.5 mg total) by mouth daily with breakfast.  Dispense: 30 capsule; Refill: 0  6. Constipation, unspecified constipation type She states she does still take Linzess occasionally, but does not yet need a refill.   7. Anorgasmia of female She is concerned this is due to pinched nerves, but advised that is not likely, and that SSRI is a more likely cause. She is going to wean off the sertraline as above. She asked for a prescription for a hormonal  cream to help with this. Will set upAmbulatory referral to Obstetrics / Gynecology  8. Menopause Trial of venlafaxine as above. Can try OTC Black Cohosh.  - Ambulatory referral to Obstetrics / Gynecology       The entirety of the information documented in the History of Present Illness, Review of Systems and Physical Exam were personally obtained by me. Portions of this information were initially documented by the CMA and reviewed by me for thoroughness and accuracy.     Lelon Huh, MD  [facility] [phone] (phone) [phone] (fax)  [place]

## 2021-11-29 LAB — PAIN MGT SCRN (14 DRUGS), UR
Amphetamine Scrn, Ur: POSITIVE ng/mL — AB
BARBITURATE SCREEN URINE: NEGATIVE ng/mL
BENZODIAZEPINE SCREEN, URINE: POSITIVE ng/mL — AB
Buprenorphine, Urine: NEGATIVE ng/mL
CANNABINOIDS UR QL SCN: NEGATIVE ng/mL
Cocaine (Metab) Scrn, Ur: NEGATIVE ng/mL
Creatinine(Crt), U: 124.9 mg/dL (ref 20.0–300.0)
Fentanyl, Urine: NEGATIVE pg/mL
Meperidine Screen, Urine: NEGATIVE ng/mL
Methadone Screen, Urine: NEGATIVE ng/mL
OXYCODONE+OXYMORPHONE UR QL SCN: POSITIVE ng/mL — AB
Opiate Scrn, Ur: POSITIVE ng/mL — AB
Ph of Urine: 5.4 (ref 4.5–8.9)
Phencyclidine Qn, Ur: NEGATIVE ng/mL
Propoxyphene Scrn, Ur: NEGATIVE ng/mL
Tramadol Screen, Urine: NEGATIVE ng/mL

## 2021-12-03 ENCOUNTER — Encounter: Payer: Self-pay | Admitting: Physician Assistant

## 2021-12-03 DIAGNOSIS — F909 Attention-deficit hyperactivity disorder, unspecified type: Secondary | ICD-10-CM | POA: Insufficient documentation

## 2021-12-03 DIAGNOSIS — F4010 Social phobia, unspecified: Secondary | ICD-10-CM | POA: Insufficient documentation

## 2021-12-10 ENCOUNTER — Other Ambulatory Visit: Payer: Self-pay | Admitting: Family Medicine

## 2021-12-10 DIAGNOSIS — G8929 Other chronic pain: Secondary | ICD-10-CM

## 2021-12-10 DIAGNOSIS — M5442 Lumbago with sciatica, left side: Secondary | ICD-10-CM

## 2021-12-10 MED ORDER — OXYCODONE HCL 15 MG PO TABS: 15.0000 mg | ORAL_TABLET | Freq: Four times a day (QID) | ORAL | 0 refills | Status: DC

## 2022-01-02 ENCOUNTER — Other Ambulatory Visit: Payer: Self-pay | Admitting: Family Medicine

## 2022-01-02 DIAGNOSIS — F411 Generalized anxiety disorder: Secondary | ICD-10-CM

## 2022-01-02 NOTE — Telephone Encounter (Signed)
Requested medication (s) are due for refill today - soon ? ?Requested medication (s) are on the active medication list -yes ? ?Future visit scheduled -yes ? ?Last refill: 07/15/21 #60 5RF ? ?Notes to clinic: Request RF: non delegated Rx ? ?Requested Prescriptions  ?Pending Prescriptions Disp Refills  ? alprazolam (XANAX) 2 MG tablet [Pharmacy Med Name: ALPRAZOLAM 2 MG TAB] 60 tablet   ?  Sig: TAKE 1/2 TABLET BY MOUTH 4 TIMES DAILY MAX 2 TABLETS PER DAY  ?  ? Not Delegated - Psychiatry: Anxiolytics/Hypnotics 2 Failed - 01/02/2022 11:11 AM  ?  ?  Failed - This refill cannot be delegated  ?  ?  Failed - Urine Drug Screen completed in last 360 days  ?  ?  Passed - Patient is not pregnant  ?  ?  Passed - Valid encounter within last 6 months  ?  Recent Outpatient Visits   ? ?      ? 1 month ago Chronic, continuous use of opioids  ? [facility] Fisher, Kirstie Peri, MD  ? 1 month ago Current severe episode of major depressive disorder without psychotic features without prior episode [facility])  ? [facility] Thedore Mins, Ria Comment, PA-C  ? 4 months ago No-show for appointment  ? [facility] Birdie Sons, MD  ? 1 year ago Right lower quadrant abdominal pain  ? [facility] Fisher, Kirstie Peri, MD  ? 1 year ago Chronic bilateral low back pain with bilateral sciatica (Primary Area of Pain) (L>R)  ? [facility] Caryn Section, Kirstie Peri, MD  ? ?  ?  ?Future Appointments   ? ?        ? In 1 week Fisher, Kirstie Peri, MD [facility]  ? ?  ? ?  ?  ?  ? ? ? ?Requested Prescriptions  ?Pending Prescriptions Disp Refills  ? alprazolam (XANAX) 2 MG tablet [Pharmacy Med Name: ALPRAZOLAM 2 MG TAB] 60 tablet   ?  Sig: TAKE 1/2 TABLET BY MOUTH 4 TIMES DAILY MAX 2 TABLETS PER DAY  ?  ? Not Delegated - Psychiatry: Anxiolytics/Hypnotics 2 Failed - 01/02/2022 11:11 AM  ?  ?  Failed - This refill cannot be delegated  ?  ?  Failed - Urine Drug Screen completed in last 360 days  ?  ?   Passed - Patient is not pregnant  ?  ?  Passed - Valid encounter within last 6 months  ?  Recent Outpatient Visits   ? ?      ? 1 month ago Chronic, continuous use of opioids  ? [facility] Fisher, Kirstie Peri, MD  ? 1 month ago Current severe episode of major depressive disorder without psychotic features without prior episode [facility])  ? [facility] Thedore Mins, Ria Comment, PA-C  ? 4 months ago No-show for appointment  ? [facility] Birdie Sons, MD  ? 1 year ago Right lower quadrant abdominal pain  ? [facility] Fisher, Kirstie Peri, MD  ? 1 year ago Chronic bilateral low back pain with bilateral sciatica (Primary Area of Pain) (L>R)  ? [facility] Caryn Section, Kirstie Peri, MD  ? ?  ?  ?Future Appointments   ? ?        ? In 1 week Fisher, Kirstie Peri, MD [facility]  ? ?  ? ?  ?  ?  ? ? ? ?

## 2022-01-06 ENCOUNTER — Other Ambulatory Visit: Payer: Self-pay | Admitting: Family Medicine

## 2022-01-06 DIAGNOSIS — G8929 Other chronic pain: Secondary | ICD-10-CM

## 2022-01-07 ENCOUNTER — Other Ambulatory Visit: Payer: Self-pay | Admitting: Family Medicine

## 2022-01-07 DIAGNOSIS — N951 Menopausal and female climacteric states: Secondary | ICD-10-CM

## 2022-01-07 MED ORDER — OXYCODONE HCL 15 MG PO TABS: 15.0000 mg | ORAL_TABLET | Freq: Four times a day (QID) | ORAL | 0 refills | Status: DC

## 2022-01-12 ENCOUNTER — Ambulatory Visit: Payer: Medicaid Other | Admitting: Family Medicine

## 2022-01-12 NOTE — Progress Notes (Deleted)
?  ? ? ?  Established patient visit ? ? ?Patient: Martha Guerra   DOB: 1981/08/14   41 y.o. Female  MRN: [id number] ?Visit Date: 01/12/2022 ? ?Today's healthcare provider: Lelon Huh, MD  ? ?No chief complaint on file. ? ?Subjective  ?  ?HPI  ?*** ? ?Medications: ?Outpatient Medications Prior to Visit  ?Medication Sig  ? alprazolam (XANAX) 2 MG tablet TAKE 1/2 TABLET BY MOUTH 4 TIMES DAILY MAX 2 TABLETS PER DAY  ? amphetamine-dextroamphetamine (ADDERALL) 10 MG tablet Take by mouth.  ? bacitracin ointment Apply 1 application topically 2 (two) times daily.  ? lamoTRIgine (LAMICTAL) 100 MG tablet   ? lidocaine (XYLOCAINE) 2 % jelly 1 application as needed.  ? lidocaine (XYLOCAINE) 5 % ointment Apply topically every 12 (twelve) hours as needed.  ? linaclotide (LINZESS) 145 MCG CAPS capsule Take 1 capsule (145 mcg total) by mouth daily before breakfast. (Patient taking differently: Take 145 mcg by mouth. Takes only as needed)  ? omeprazole (PRILOSEC) 40 MG capsule TAKE 1 CAPSULE BY MOUTH ONCE DAILY  ? oxyCODONE (ROXICODONE) 15 MG immediate release tablet Take 1 tablet (15 mg total) by mouth in the morning, at noon, in the evening, and at bedtime.  ? TRINTELLIX 20 MG TABS   ? venlafaxine XR (EFFEXOR-XR) 37.5 MG 24 hr capsule TAKE 1 CAPSULE BY MOUTH ONCE EVERY MORNING WITH BREAKFAST  ? Vitamin D, Ergocalciferol, (DRISDOL) 1.25 MG (50000 UNIT) CAPS capsule TAKE 1 CAPSULE BY MOUTH SAME DAY EACH WEEK  ? ?No facility-administered medications prior to visit.  ? ? ?Review of Systems ? ?{Labs  Heme  Chem  Endocrine  Serology  Results Review (optional):23779} ?  Objective  ?  ?There were no vitals taken for this visit. ?{Show previous vital signs (optional):23777} ? ?Physical Exam  ?*** ? ?No results found for any visits on 01/12/22. ? Assessment & Plan  ?  ? ?*** ? ?No follow-ups on file.  ?   ? ?{provider attestation***:1} ? ? ?Lelon Huh, MD  ?[facility] ?[phone] (phone) ?[phone]  (fax) ? ? Medical Group ?

## 2022-01-15 ENCOUNTER — Encounter: Payer: Medicaid Other | Admitting: Obstetrics and Gynecology

## 2022-01-16 ENCOUNTER — Encounter: Payer: Medicaid Other | Admitting: Obstetrics and Gynecology

## 2022-02-04 ENCOUNTER — Other Ambulatory Visit: Payer: Self-pay | Admitting: Family Medicine

## 2022-02-04 DIAGNOSIS — G8929 Other chronic pain: Secondary | ICD-10-CM

## 2022-02-05 MED ORDER — OXYCODONE HCL 15 MG PO TABS: 15.0000 mg | ORAL_TABLET | Freq: Four times a day (QID) | ORAL | 0 refills | Status: DC

## 2022-03-03 ENCOUNTER — Encounter: Payer: Self-pay | Admitting: Obstetrics and Gynecology

## 2022-03-03 ENCOUNTER — Ambulatory Visit: Payer: Medicaid Other | Admitting: Obstetrics and Gynecology

## 2022-03-03 VITALS — BP 107/75 | HR 88 | Ht 64.0 in | Wt 174.7 lb

## 2022-03-03 DIAGNOSIS — F5231 Female orgasmic disorder: Secondary | ICD-10-CM | POA: Diagnosis not present

## 2022-03-03 NOTE — Progress Notes (Signed)
Patient presents today to discuss anorgasmia and perimenopause concerns. Patient reports anorgasmic symptoms began approximately one year ago, states she is curious if it could be nerve related. She states over the last 6 months her periods have started to come every 3 weeks rather than the 28 days apart she is used to. She states the week prior to her period she has extreme emotions and feel likes a complete different person. Patient has never been on birth control.  ?

## 2022-03-03 NOTE — Progress Notes (Signed)
HPI: ?     [name] is a 41 y.o. G2P1011 who LMP was No LMP recorded. ? ?Subjective:  ? ?She presents today with multiple issues that she would like to discuss.  The first is that she has been unable to achieve orgasm in the last 2 years.  She reports that prior to this she had no difficulty but she thinks something has changed.  She reports no feeling in her breast and no real feeling in her vagina.  She thinks this is contributing to her inability to have orgasm.  She also states that her menstrual periods have become 3 weeks apart instead of 4.  She also feels as if she is having hot flashes.  She wonders if she is in menopause. ?She reports some history of neurologic problem in her neck that she worries is causing numbness in her arms and breasts. ?She is on multiple antidepressant medications. ? ?  Hx: ?The following portions of the patient's history were reviewed and updated as appropriate: ?            She  has a past medical history of Anxiety, Asthma, Chronic constipation, Cystitides, interstitial, chronic, Depression, Gastritis, GERD (gastroesophageal reflux disease), History of kidney stones, and History of ovarian cyst. ?She does not have any pertinent problems on file. ?She  has a past surgical history that includes cystectomy and Ovarian cyst surgery. ?Her family history includes Heart attack in her mother; Hypertension in her father. ?She  reports that she has been smoking cigarettes. She has a 8.50 pack-year smoking history. She has never used smokeless tobacco. She reports that she does not drink alcohol and does not use drugs. ?She has a current medication list which includes the following prescription(s): alprazolam, amphetamine-dextroamphetamine, lamotrigine, lidocaine, lidocaine, oxycodone, trintellix, venlafaxine xr, and vitamin d (ergocalciferol). ?She is allergic to prednisone and acetaminophen. ?      ?Review of Systems:  ?Review of Systems ? ?Constitutional: Denied  constitutional symptoms, night sweats, recent illness, fatigue, fever, insomnia and weight loss.  ?Eyes: Denied eye symptoms, eye pain, photophobia, vision change and visual disturbance.  ?Ears/Nose/Throat/Neck: Denied ear, nose, throat or neck symptoms, hearing loss, nasal discharge, sinus congestion and sore throat.  ?Cardiovascular: Denied cardiovascular symptoms, arrhythmia, chest pain/pressure, edema, exercise intolerance, orthopnea and palpitations.  ?Respiratory: Denied pulmonary symptoms, asthma, pleuritic pain, productive sputum, cough, dyspnea and wheezing.  ?Gastrointestinal: Denied, gastro-esophageal reflux, melena, nausea and vomiting.  ?Genitourinary: See HPI for additional information.  ?Musculoskeletal: Denied musculoskeletal symptoms, stiffness, swelling, muscle weakness and myalgia.  ?Dermatologic: Denied dermatology symptoms, rash and scar.  ?Neurologic: Denied neurology symptoms, dizziness, headache, neck pain and syncope.  ?Psychiatric: Denied psychiatric symptoms, anxiety and depression.  ?Endocrine: Denied endocrine symptoms including hot flashes and night sweats.  ? ?Meds: ?  ?Current Outpatient Medications on File Prior to Visit  ?Medication Sig Dispense Refill  ? alprazolam (XANAX) 2 MG tablet TAKE 1/2 TABLET BY MOUTH 4 TIMES DAILY MAX 2 TABLETS PER DAY 60 tablet 3  ? amphetamine-dextroamphetamine (ADDERALL) 10 MG tablet Take by mouth.    ? lamoTRIgine (LAMICTAL) 100 MG tablet   4  ? lidocaine (XYLOCAINE) 2 % jelly 1 application as needed.    ? lidocaine (XYLOCAINE) 5 % ointment Apply topically every 12 (twelve) hours as needed. 35.44 g 3  ? oxyCODONE (ROXICODONE) 15 MG immediate release tablet Take 1 tablet (15 mg total) by mouth in the morning, at noon, in the evening, and at bedtime. 120 tablet 0  ?  TRINTELLIX 20 MG TABS   4  ? venlafaxine XR (EFFEXOR-XR) 37.5 MG 24 hr capsule TAKE 1 CAPSULE BY MOUTH ONCE EVERY MORNING WITH BREAKFAST 30 capsule 2  ? Vitamin D, Ergocalciferol, (DRISDOL)  1.25 MG (50000 UNIT) CAPS capsule TAKE 1 CAPSULE BY MOUTH SAME DAY EACH WEEK 12 capsule 4  ? ?No current facility-administered medications on file prior to visit.  ? ? ? ? ?Objective:  ?  ? ?Vitals:  ? 03/03/22 1025  ?BP: 107/75  ?Pulse: 88  ? ?Filed Weights  ? 03/03/22 1025  ?Weight: 174 lb 11.2 oz (79.2 kg)  ? ?  ?          ?        ? ?Assessment:  ?  ?G2P1011 ?Patient Active Problem List  ? Diagnosis Date Noted  ? ADHD 12/03/2021  ? Social phobia 12/03/2021  ? Chronic pain disorder 11/28/2021  ? Rash 11/24/2021  ? Vitamin D deficiency 12/26/2018  ? Chronic bilateral low back pain with bilateral sciatica (Primary Area of Pain) (L>R) 12/20/2018  ? Chronic pain of both lower extremities (Secondary Area of Pain) disease LR 12/20/2018  ? Chronic neck pain [facility] Area of Pain) (R>L) 12/20/2018  ? Chronic female pelvic pain (Fourth Area of Pain) 12/20/2018  ? Long term current use of opiate analgesic 12/20/2018  ? Long term prescription benzodiazepine use 12/20/2018  ? Neck pain 08/30/2018  ? Lumbar herniated disc 08/30/2018  ? Insomnia 07/15/2015  ? H/O urinary stone 07/15/2015  ? Gastric catarrh 07/15/2015  ? Fibromyalgia 11/13/2014  ? Panic disorder 08/07/2014  ? GERD (gastroesophageal reflux disease) 08/07/2014  ? Compulsive tobacco user syndrome 08/07/2014  ? Bipolar disorder ([place]) 08/07/2014  ? Constipation 08/07/2014  ? Tobacco use disorder 08/07/2014  ? Chronic obstructive pulmonary disease ([place]) 08/07/2014  ? Esophageal reflux 08/07/2014  ? Palpitations 06/13/2013  ? Hematuria 06/29/2012  ? Myalgia and myositis 06/29/2012  ? High-tone pelvic floor dysfunction 04/27/2012  ? Interstitial cystitis (chronic) without hematuria 08/19/2011  ? Dysuria 08/19/2011  ? Dyspareunia 08/19/2011  ? Nocturia 08/19/2011  ? Reduced libido 08/19/2011  ? Urinary urgency 08/19/2011  ? ?  ?1. Anorgasmia of female   ? ? This is her main problem today.  I do not believe she is menopausal based on her regular cycles even though they  are shorter than usual. ? I think it is entirely possible that her multiple antidepressants have contributed to her inability to orgasm. ?  ? ? ?Plan:  ?  ?       ? 1.  I have asked her to speak with her psychiatrist about possibly changing medications to see if this makes any difference in orgasm. ? 2.  TSH, FSH/LH, prolactin ordered. ? 3.  Patient specifically is requesting estrogen vaginal cream as one of her other doctor told her this may help her problem. (Sample of Premarin vaginal cream given patient advised to use every other day one quarter applicator.) ? ?Orders ?Orders Placed This Encounter  ?Procedures  ? TSH  ? Prolactin  ? FSH/LH  ? ? No orders of the defined types were placed in this encounter. ?  ?  F/U ? No follow-ups on file. ? ?Finis Bud, M.D. ?03/03/2022 ?11:01 AM ? ? ? ? ?

## 2022-03-04 LAB — FSH/LH
FSH: 4.9 m[IU]/mL
LH: 8.8 m[IU]/mL

## 2022-03-04 LAB — TSH: TSH: 1.73 u[IU]/mL (ref 0.450–4.500)

## 2022-03-04 LAB — PROLACTIN: Prolactin: 38.5 ng/mL — ABNORMAL HIGH (ref 4.8–23.3)

## 2022-03-05 ENCOUNTER — Other Ambulatory Visit: Payer: Self-pay | Admitting: Family Medicine

## 2022-03-05 DIAGNOSIS — G8929 Other chronic pain: Secondary | ICD-10-CM

## 2022-03-05 MED ORDER — OXYCODONE HCL 15 MG PO TABS: 15.0000 mg | ORAL_TABLET | Freq: Four times a day (QID) | ORAL | 0 refills | Status: DC

## 2022-03-08 NOTE — Progress Notes (Signed)
Based on these results, please have her schedule a visit with me.  May be a video visit if desired.

## 2022-03-11 ENCOUNTER — Encounter: Payer: Self-pay | Admitting: Obstetrics and Gynecology

## 2022-03-11 ENCOUNTER — Telehealth (INDEPENDENT_AMBULATORY_CARE_PROVIDER_SITE_OTHER): Payer: Medicaid Other | Admitting: Obstetrics and Gynecology

## 2022-03-11 DIAGNOSIS — R7989 Other specified abnormal findings of blood chemistry: Secondary | ICD-10-CM | POA: Diagnosis not present

## 2022-03-11 NOTE — Progress Notes (Signed)
Virtual Visit via Video Note  I connected with Martha Guerra on 03/11/22 at  7:45 AM EDT by video and verified that I was speaking with the correct person using two identifiers.    [name] is a 41 y.o. G2P1011 who LMP was No LMP recorded. I discussed the limitations, risks, security and privacy concerns of performing an evaluation and management service by video and the availability of in person appointments. I also discussed with the patient that there may be a patient responsible charge related to this service. The patient expressed understanding and agreed to proceed.  [place] of [place]:  [facility]  Patient gave explicit verbal consent for video visit:  YES  Location of provider:  [facility] office  Persons other than physician and patient involved in provider conference:  None   Subjective:   History of Present Illness:    Ms. [name] has been having multiple symptoms including headache, neck pain, breast issues and changes, irregular menstrual cycles hot flashes. Recent FSH LH testing and prolactin shows normal [place] LH but elevated prolactin levels.  Previous prolactin level obtained 3 months ago was normal.  After speaking with her she has admitted that she takes her psychiatric medicines intermittently and sometimes skips them.  She states she recently has begun taking Effexor on and off.  Hx: The following portions of the patient's history were reviewed and updated as appropriate:             She  has a past medical history of Anxiety, Asthma, Chronic constipation, Cystitides, interstitial, chronic, Depression, Gastritis, GERD (gastroesophageal reflux disease), History of kidney stones, and History of ovarian cyst. She does not have any pertinent problems on file. She  has a past surgical history that includes cystectomy and Ovarian cyst surgery. Her family history includes Heart attack in her mother; Hypertension in her father. She  reports that she has been smoking  cigarettes. She has a 8.50 pack-year smoking history. She has never used smokeless tobacco. She reports that she does not drink alcohol and does not use drugs. She has a current medication list which includes the following prescription(s): alprazolam, amphetamine-dextroamphetamine, lamotrigine, lidocaine, lidocaine, oxycodone, trintellix, venlafaxine xr, and vitamin d (ergocalciferol). She is allergic to prednisone and acetaminophen.       Review of Systems:  Review of Systems  Constitutional: Denied constitutional symptoms, night sweats, recent illness, fatigue, fever, insomnia and weight loss.  Eyes: Denied eye symptoms, eye pain, photophobia, vision change and visual disturbance.  Ears/Nose/Throat/Neck: Denied ear, nose, throat or neck symptoms, hearing loss, nasal discharge, sinus congestion and sore throat.  Cardiovascular: Denied cardiovascular symptoms, arrhythmia, chest pain/pressure, edema, exercise intolerance, orthopnea and palpitations.  Respiratory: Denied pulmonary symptoms, asthma, pleuritic pain, productive sputum, cough, dyspnea and wheezing.  Gastrointestinal: Denied, gastro-esophageal reflux, melena, nausea and vomiting.  Genitourinary: Denied genitourinary symptoms including symptomatic vaginal discharge, pelvic relaxation issues, and urinary complaints.  Musculoskeletal: Denied musculoskeletal symptoms, stiffness, swelling, muscle weakness and myalgia.  Dermatologic: Denied dermatology symptoms, rash and scar.  Neurologic: See HPI for additional information.  Psychiatric: See HPI for additional information.  Endocrine: Denied endocrine symptoms including hot flashes and night sweats.   Meds:   Current Outpatient Medications on File Prior to Visit  Medication Sig Dispense Refill   alprazolam (XANAX) 2 MG tablet TAKE 1/2 TABLET BY MOUTH 4 TIMES DAILY MAX 2 TABLETS PER DAY 60 tablet 3   amphetamine-dextroamphetamine (ADDERALL) 10 MG tablet Take by mouth.     lamoTRIgine  (LAMICTAL)  100 MG tablet   4   lidocaine (XYLOCAINE) 2 % jelly 1 application as needed.     lidocaine (XYLOCAINE) 5 % ointment Apply topically every 12 (twelve) hours as needed. 35.44 g 3   oxyCODONE (ROXICODONE) 15 MG immediate release tablet Take 1 tablet (15 mg total) by mouth in the morning, at noon, in the evening, and at bedtime. 120 tablet 0   TRINTELLIX 20 MG TABS   4   venlafaxine XR (EFFEXOR-XR) 37.5 MG 24 hr capsule TAKE 1 CAPSULE BY MOUTH ONCE EVERY MORNING WITH BREAKFAST 30 capsule 2   Vitamin D, Ergocalciferol, (DRISDOL) 1.25 MG (50000 UNIT) CAPS capsule TAKE 1 CAPSULE BY MOUTH SAME DAY EACH WEEK 12 capsule 4   No current facility-administered medications on file prior to visit.    Assessment:    G2P1011 Patient Active Problem List   Diagnosis Date Noted   ADHD 12/03/2021   Social phobia 12/03/2021   Chronic pain disorder 11/28/2021   Rash 11/24/2021   Vitamin D deficiency 12/26/2018   Chronic bilateral low back pain with bilateral sciatica (Primary Area of Pain) (L>R) 12/20/2018   Chronic pain of both lower extremities (Secondary Area of Pain) disease LR 12/20/2018   Chronic neck pain (Tertiary Area of Pain) (R>L) 12/20/2018   Chronic female pelvic pain (Fourth Area of Pain) 12/20/2018   Long term current use of opiate analgesic 12/20/2018   Long term prescription benzodiazepine use 12/20/2018   Neck pain 08/30/2018   Lumbar herniated disc 08/30/2018   Insomnia 07/15/2015   H/O urinary stone 07/15/2015   Gastric catarrh 07/15/2015   Fibromyalgia 11/13/2014   Panic disorder 08/07/2014   GERD (gastroesophageal reflux disease) 08/07/2014   Compulsive tobacco user syndrome 08/07/2014   Bipolar disorder ([place]) 08/07/2014   Constipation 08/07/2014   Tobacco use disorder 08/07/2014   Chronic obstructive pulmonary disease ([place]) 08/07/2014   Esophageal reflux 08/07/2014   Palpitations 06/13/2013   Hematuria 06/29/2012   Myalgia and myositis 06/29/2012   High-tone  pelvic floor dysfunction 04/27/2012   Interstitial cystitis (chronic) without hematuria 08/19/2011   Dysuria 08/19/2011   Dyspareunia 08/19/2011   Nocturia 08/19/2011   Reduced libido 08/19/2011   Urinary urgency 08/19/2011     1. Elevated prolactin level     Patient has elevated prolactin level but I am not sure if this is a natural elevation or an elevation caused by medications.  Multiple antipsychotic/antidepressant medications can cause an elevation in prolactin levels.  Patient is not clear which medicine she takes in which she does not.  She states that she has been poorly compliant with medication usage.  Plan:            1.  We spoke at length regarding The use of medications.  I asked her to speak with her psychiatrist or other doctor prescribing her meds and discussed her recent elevation prolactin.  Before I try to fix her prolactin with prolactin lowering medication I think a determination has to be made as to whether this is a medication related elevation in prolactin which has occurred in the last 3 months.  She states that she will get in touch with her psychiatrist and discuss medications with him.  After change in medication or if the patient stopped all medication a repeat prolactin level can be obtained. I am not sure what symptoms she is currently having are related to her noncompliance with medication versus possible mild elevation in prolactin. Orders No orders of the defined types were  placed in this encounter.   No orders of the defined types were placed in this encounter.     F/U  Return for Next Scheduled Follow-up. I spent 25 minutes involved in the care of this patient preparing to see the patient by obtaining and reviewing her medical history (including labs, imaging tests and prior procedures), documenting clinical information in the electronic health record (EHR), counseling and coordinating care plans, writing and sending prescriptions, ordering tests or  procedures and in direct communicating with the patient and medical staff discussing pertinent items from her history and physical exam.   Finis Bud, M.D. 03/11/2022 8:15 AM

## 2022-03-20 ENCOUNTER — Ambulatory Visit: Payer: Self-pay | Admitting: *Deleted

## 2022-03-20 DIAGNOSIS — F3189 Other bipolar disorder: Secondary | ICD-10-CM | POA: Diagnosis not present

## 2022-03-20 DIAGNOSIS — F900 Attention-deficit hyperactivity disorder, predominantly inattentive type: Secondary | ICD-10-CM | POA: Diagnosis not present

## 2022-03-20 DIAGNOSIS — F4310 Post-traumatic stress disorder, unspecified: Secondary | ICD-10-CM | POA: Diagnosis not present

## 2022-03-20 DIAGNOSIS — F410 Panic disorder [episodic paroxysmal anxiety] without agoraphobia: Secondary | ICD-10-CM | POA: Diagnosis not present

## 2022-03-20 NOTE — Telephone Encounter (Signed)
Pt has been dealing with headaches for 5-6 months now.  Her gyn dr. Lennette Bihari it might be the antidepressants she was taking causing the headaches and prolactin level to be elevated.   Pt was instructed to check with her psychiatrist to see if the meds she is on for depression could be contributing to this.    Pt did check with her psychiatrist who said he did not think the medications were causing her symptoms.   Pt was instructed to follow up with her PCP.  Pt is requesting a neurosurgeon referral. There are no appts available with Dr. Caryn Section until July 2023.   I have put her on the wait list in case of a cancellation.   I also sent a note to [facility] to see if she can be worked in.  Pt was agreeable to going to the ED however when I mentioned doing a virtual visit she preferred to do that.  So she is scheduling a [place] Virtual visit as soon as she gets off the phone.    Her new phone number is [phone].   Chief Complaint: Headaches for 5-6 months that are getting worse. Symptoms: dizziness, headaches, sensitive to light, unbalanced and nausea Frequency: Headaches are getting  Pertinent Negatives: Patient denies N/A Disposition: '[]'$ ED /'[]'$ Urgent Care (no appt availability in office) / '[]'$ Appointment(In office/virtual)/ '[x]'$  [place] [facility]/ '[]'$ Home Care/ '[]'$ Refused Recommended Disposition /'[]'$ [facility]/ '[]'$  Follow-up with PCP Additional Notes: Pt is agreeable to going to the ED if needed over the weekend for the headache pain.

## 2022-03-20 NOTE — Telephone Encounter (Signed)
Reason for Disposition  [1] SEVERE headache (e.g., excruciating) AND [2] not improved after 2 hours of pain medicine  Answer Assessment - Initial Assessment Questions 1. LOCATION: "Where does it hurt?"      I have a bad headache.   I've had it a long time but it's getting worse.  I'm using ibuprofen but it's not helping.   I'm on Oxycodone for other pain reasons but it makes it worse.    I'm dizzy and unbalanced and my eyes are really sensitive to light. My prolactin is really high.  My pituary glad is messed up     They were thinking it was my antidepressant by gyn.   I need a referral to a neurosurgeon.   2. ONSET: "When did the headache start?" (Minutes, hours or days)      They have been going on 5-6 months. 3. PATTERN: "Does the pain come and go, or has it been constant since it started?"     *No Answer* 4. SEVERITY: "How bad is the pain?" and "What does it keep you from doing?"  (e.g., Scale 1-10; mild, moderate, or severe)   - MILD (1-3): doesn't interfere with normal activities    - MODERATE (4-7): interferes with normal activities or awakens from sleep    - SEVERE (8-10): excruciating pain, unable to do any normal activities        Severe and getting worse 5. RECURRENT SYMPTOM: "Have you ever had headaches before?" If Yes, ask: "When was the last time?" and "What happened that time?"      *No Answer* 6. CAUSE: "What do you think is causing the headache?"     *No Answer* 7. MIGRAINE: "Have you been diagnosed with migraine headaches?" If Yes, ask: "Is this headache similar?"      *No Answer* 8. HEAD INJURY: "Has there been any recent injury to the head?"      *No Answer* 9. OTHER SYMPTOMS: "Do you have any other symptoms?" (fever, stiff neck, eye pain, sore throat, cold symptoms)     *No Answer* 10. PREGNANCY: "Is there any chance you are pregnant?" "When was your last menstrual period?"       *No Answer*  Protocols used: Headache-A-AH

## 2022-03-23 NOTE — Telephone Encounter (Signed)
Have opened my schedule the morning of June 23rd. She can have appointment that morning if she likes.

## 2022-03-23 NOTE — Telephone Encounter (Signed)
Called pt to see if she would like to schedule an appt for tomorrow in a same day slot (okay per office manager Sarah).  Okay for PEC to schedule if pt calls back.

## 2022-03-27 ENCOUNTER — Ambulatory Visit: Payer: Medicaid Other | Admitting: Physician Assistant

## 2022-03-27 ENCOUNTER — Encounter: Payer: Self-pay | Admitting: Physician Assistant

## 2022-03-27 VITALS — BP 116/87 | HR 104 | Ht 64.0 in | Wt 173.6 lb

## 2022-03-27 DIAGNOSIS — R7989 Other specified abnormal findings of blood chemistry: Secondary | ICD-10-CM

## 2022-03-27 DIAGNOSIS — R42 Dizziness and giddiness: Secondary | ICD-10-CM | POA: Insufficient documentation

## 2022-03-27 DIAGNOSIS — R519 Headache, unspecified: Secondary | ICD-10-CM | POA: Insufficient documentation

## 2022-03-27 DIAGNOSIS — F3163 Bipolar disorder, current episode mixed, severe, without psychotic features: Secondary | ICD-10-CM

## 2022-03-27 MED ORDER — LORATADINE 10 MG PO TABS: 10.0000 mg | ORAL_TABLET | Freq: Every day | ORAL | 1 refills | Status: DC

## 2022-03-27 MED ORDER — FLUTICASONE PROPIONATE 50 MCG/ACT NA SUSP: 2.0000 | Freq: Every day | NASAL | 1 refills | Status: DC

## 2022-03-27 NOTE — Assessment & Plan Note (Signed)
Will recheck today. If elevated will refer to neuro

## 2022-03-27 NOTE — Assessment & Plan Note (Addendum)
Currently unmedicated, unstable. Confirmed no current intent of SI or self harm. At this time she is not a danger to herself. Advised pt strongly she call her psychiatrist. Gave physical resources for [facility] emergency services. discussed SI during cycle-- gave emergency crisis line.  Called psychiatrist Dr Carlos Levering at [facility] but was unable to contact a provider or leave a message F/u with pcp

## 2022-03-27 NOTE — Assessment & Plan Note (Signed)
Could be consistent w/ sinus HA, advised taking claritin daily w/ flonase.

## 2022-03-27 NOTE — Progress Notes (Signed)
euro  Acute Office Visit  Subjective:     Patient ID: Martha Guerra, female    DOB: [birth year], 41 y.o.   MRN: [id number]  Cc. Headaches, elevated prolactin level  HPI Patient is in today for discussion about referral. She states she saw GYN for hormonal issues and they checked her hormone levels again- found an elevated prolactin level. They recommended stopped all the psych medications? Despite her psychiatrist not recommending this. She wants to recheck her prolactin level off medication.   She attests to frequent headaches at the front of her head, neck pain, dizziness on and off. She describes the dizziness as an off balance feeling. Denies falls, syncope.   Since stopping her psychiatric medications, she has felt very unstable. She often feels unstable, usually right before her period. She endorses SI during these times. Denies any episodes of self harm. Denies any current SI.  Denies any recreational drug use.  Review of Systems  Neurological:  Positive for headaches.  Psychiatric/Behavioral:  Positive for depression and suicidal ideas. The patient is nervous/anxious.       Objective:    Blood pressure 116/87, pulse (!) 104, height '5\' 4"'$  (1.626 m), weight 173 lb 9.6 oz (78.7 kg), last menstrual period 03/21/2022, SpO2 100 %.   Physical Exam Vitals reviewed.  Constitutional:      Appearance: She is not ill-appearing.  HENT:     Head: Normocephalic.     Right Ear: Tympanic membrane normal.     Left Ear: Tympanic membrane normal.     Nose: Congestion and rhinorrhea present.     Mouth/Throat:     Pharynx: Posterior oropharyngeal erythema present.  Eyes:     Conjunctiva/sclera: Conjunctivae normal.  Cardiovascular:     Rate and Rhythm: Normal rate.  Pulmonary:     Effort: Pulmonary effort is normal. No respiratory distress.  Neurological:     General: No focal deficit present.     Mental Status: She is alert and oriented to person, place, and time.  Psychiatric:         Mood and Affect: Mood normal.        Behavior: Behavior normal.    No results found for any visits on 03/27/22.      Assessment & Plan:   Problem List Items Addressed This Visit       Other   Bipolar disorder ([place])    Currently unmedicated, unstable. Confirmed no current intent of SI or self harm. At this time she is not a danger to herself. Advised pt strongly she call her psychiatrist. Gave physical resources for [facility] emergency services. discussed SI during cycle-- gave emergency crisis line.  Called psychiatrist Dr Carlos Levering at [facility] but was unable to contact a provider or leave a message F/u with pcp       Relevant Orders   Ambulatory referral to Psychology   Elevated prolactin level - Primary    Will recheck today. If elevated will refer to neuro      Relevant Orders   Prolactin   Frequent headaches    Could be consistent w/ sinus HA, advised taking claritin daily w/ flonase.        Relevant Medications   loratadine (CLARITIN) 10 MG tablet   fluticasone (FLONASE) 50 MCG/ACT nasal spray     Return in about 3 weeks (around 04/17/2022).  I, [name], PA-C have reviewed all documentation for this visit. The documentation on  03/27/2022 for the exam, diagnosis, procedures, and  orders are all accurate and complete.  [name], PA-C [facility] [address] [place], [place], [address] Office: [phone] Fax: [phone]

## 2022-03-27 NOTE — Patient Instructions (Signed)
Emergency Mental Health Services: ? ?[facility] (like an urgent care for mental health) ?[address], [place], [place] [address] ?[phone] ? ?[facility] ?Several different mental health services, has a crisis center ? ?[place] [place] ?[address], [place], [place] [address] ? [phone] ?Same-Day Access Hours:  Monday, Wednesday and Friday, 8am - 3pm ?Crisis & Diversion Center: ?Walk-In Crisis Hours: 7 days/week, 8am - 12am ?-  Community Walk-In Services ?-  Law Enforcement Drop-Off (side door) ? ?

## 2022-03-31 DIAGNOSIS — R7989 Other specified abnormal findings of blood chemistry: Secondary | ICD-10-CM | POA: Diagnosis not present

## 2022-04-01 ENCOUNTER — Other Ambulatory Visit: Payer: Self-pay | Admitting: Physician Assistant

## 2022-04-01 DIAGNOSIS — R7989 Other specified abnormal findings of blood chemistry: Secondary | ICD-10-CM

## 2022-04-01 LAB — PROLACTIN: Prolactin: 30.5 ng/mL — ABNORMAL HIGH (ref 4.8–23.3)

## 2022-04-02 ENCOUNTER — Other Ambulatory Visit: Payer: Self-pay | Admitting: Physician Assistant

## 2022-04-02 ENCOUNTER — Other Ambulatory Visit: Payer: Self-pay | Admitting: Family Medicine

## 2022-04-02 ENCOUNTER — Telehealth: Payer: Self-pay

## 2022-04-02 DIAGNOSIS — G8929 Other chronic pain: Secondary | ICD-10-CM

## 2022-04-02 MED ORDER — OXYCODONE HCL 15 MG PO TABS: 15.0000 mg | ORAL_TABLET | Freq: Four times a day (QID) | ORAL | 0 refills | Status: DC

## 2022-04-02 NOTE — Telephone Encounter (Signed)
Pt given lab results per notes of Ria Comment, [place] on 04/02/22. Pt verbalized understanding. Advised her that referral is already placed and someone should be calling to schedule an appt. Pt also wanted to let Ria Comment or Dr. Caryn Section know that she is having major issue with hair loss up to like 60% and dizziness and hot flashes that she feels is related to elevated prolactin and doesn't feel like neuro will deal with that so was unsure if another referral for Dermatology or Endocrinology could be placed. Advised pt I would send message and someone can fu with her.

## 2022-04-07 ENCOUNTER — Telehealth: Payer: Self-pay

## 2022-04-07 ENCOUNTER — Other Ambulatory Visit: Payer: Self-pay | Admitting: Physician Assistant

## 2022-04-07 DIAGNOSIS — R7989 Other specified abnormal findings of blood chemistry: Secondary | ICD-10-CM

## 2022-04-07 NOTE — Telephone Encounter (Signed)
Copied from [place] [phone]. Topic: General - Other >> Apr 07, 2022  4:45 PM Burman Freestone wrote: Reason for CRM: [employer] would like to speak peer to peer for auth of MRI. You can reach them at [phone] Member ID: [id number]

## 2022-04-10 ENCOUNTER — Ambulatory Visit: Payer: Medicaid Other | Admitting: Family Medicine

## 2022-04-10 ENCOUNTER — Encounter: Payer: Self-pay | Admitting: Family Medicine

## 2022-04-10 VITALS — BP 103/62 | HR 82 | Temp 97.8°F | Resp 16 | Wt 171.0 lb

## 2022-04-10 DIAGNOSIS — R7989 Other specified abnormal findings of blood chemistry: Secondary | ICD-10-CM

## 2022-04-10 DIAGNOSIS — R42 Dizziness and giddiness: Secondary | ICD-10-CM | POA: Diagnosis not present

## 2022-04-10 DIAGNOSIS — R232 Flushing: Secondary | ICD-10-CM | POA: Diagnosis not present

## 2022-04-10 DIAGNOSIS — R4586 Emotional lability: Secondary | ICD-10-CM | POA: Diagnosis not present

## 2022-04-10 NOTE — Progress Notes (Signed)
I,Roshena L Chambers,acting as a scribe for Lelon Huh, MD.,have documented all relevant documentation on the behalf of Lelon Huh, MD,as directed by  Lelon Huh, MD while in the presence of Lelon Huh, MD.   Established patient visit   Patient: Martha Guerra   DOB: [birth year]   41 y.o. Female  MRN: [id number] Visit Date: 04/10/2022  Today's healthcare provider: Lelon Huh, MD   Chief Complaint  Patient presents with   Follow-up   Subjective    HPI  Follow up for elevated prolactin level:  The patient was last seen for this 03/27/2022 (seen by [name], PA-C).   During that visit, labs were ordered showing prolactin levels remained elevated. Neurosurgery and Psychiatry referral placed.   She reports persistent hot flashes, dizziness, and dramatic mood swings over the past year.She tried stopping venlafaxine for 4 weeks which didn't seem to make an difference.   She also reports persistent bifrontal headaches associated with some light sensitivity. Tried nasal allergy medication which did not seem to help.     -----------------------------------------------------------------------------------------   Medications: Outpatient Medications Prior to Visit  Medication Sig   alprazolam (XANAX) 2 MG tablet TAKE 1/2 TABLET BY MOUTH 4 TIMES DAILY MAX 2 TABLETS PER DAY   amphetamine-dextroamphetamine (ADDERALL) 10 MG tablet Take by mouth.   fluticasone (FLONASE) 50 MCG/ACT nasal spray Place 2 sprays into both nostrils daily.   lamoTRIgine (LAMICTAL) 100 MG tablet    lidocaine (XYLOCAINE) 2 % jelly 1 application as needed.   lidocaine (XYLOCAINE) 5 % ointment Apply topically every 12 (twelve) hours as needed.   loratadine (CLARITIN) 10 MG tablet Take 1 tablet (10 mg total) by mouth daily.   oxyCODONE (ROXICODONE) 15 MG immediate release tablet Take 1 tablet (15 mg total) by mouth in the morning, at noon, in the evening, and at bedtime.   TRINTELLIX 20 MG  TABS    venlafaxine XR (EFFEXOR-XR) 37.5 MG 24 hr capsule TAKE 1 CAPSULE BY MOUTH ONCE EVERY MORNING WITH BREAKFAST   Vitamin D, Ergocalciferol, (DRISDOL) 1.25 MG (50000 UNIT) CAPS capsule TAKE 1 CAPSULE BY MOUTH SAME DAY EACH WEEK   No facility-administered medications prior to visit.    Review of Systems  Constitutional:  Negative for appetite change, chills, fatigue and fever.  Respiratory:  Negative for chest tightness and shortness of breath.   Cardiovascular:  Negative for chest pain and palpitations.  Gastrointestinal:  Negative for abdominal pain, nausea and vomiting.  Neurological:  Negative for dizziness and weakness.  Psychiatric/Behavioral:  Positive for dysphoric mood.        Objective    BP 103/62 (BP Location: Left Arm, Patient Position: Sitting, Cuff Size: Large)   Pulse 82   Temp 97.8 F (36.6 C) (Oral)   Resp 16   Wt 171 lb (77.6 kg)   LMP 03/21/2022 Comment: stopped 02/23/22  SpO2 100%   BMI 29.35 kg/m    Physical Exam   General: Appearance:    Well developed, well nourished female in no acute distress  Eyes:    PERRL, conjunctiva/corneas clear, EOM's intact       Lungs:     Clear to auscultation bilaterally, respirations unlabored  Heart:    Normal heart rate. Normal rhythm. No murmurs, rubs, or gallops.    MS:   All extremities are intact.    Neurologic:   Awake, alert, oriented x 3. No apparent focal neurological defect.         Assessment & Plan  1. Dizziness Extensive lab evaluation done over the last several months which were remarkable only elevated prolactin as below. Is schedule for mri of brain.   2. Mood swings   3. Hot flashes   4. Elevated prolactin level Increased from 11.6 in February to 38.5 in  May then back to 30.5 earlier this month. Unclear if this is related to constellation of other symptoms. Is schedule brain for further evaluation.        The entirety of the information documented in the History of Present Illness,  Review of Systems and Physical Exam were personally obtained by me. Portions of this information were initially documented by the CMA and reviewed by me for thoroughness and accuracy.     Lelon Huh, MD  [facility] [phone] (phone) [phone] (fax)  [place]

## 2022-04-28 ENCOUNTER — Ambulatory Visit
Admission: RE | Admit: 2022-04-28 | Discharge: 2022-04-28 | Disposition: A | Discharge status: Home / Self Care | Payer: Medicaid Other | Source: Ambulatory Visit | Attending: Physician Assistant | Admitting: Physician Assistant

## 2022-04-28 ENCOUNTER — Other Ambulatory Visit: Payer: Self-pay | Admitting: Family Medicine

## 2022-04-28 DIAGNOSIS — R7989 Other specified abnormal findings of blood chemistry: Secondary | ICD-10-CM | POA: Insufficient documentation

## 2022-04-28 DIAGNOSIS — E221 Hyperprolactinemia: Secondary | ICD-10-CM | POA: Diagnosis not present

## 2022-04-28 DIAGNOSIS — F411 Generalized anxiety disorder: Secondary | ICD-10-CM

## 2022-04-28 MED ORDER — GADOBUTROL 1 MMOL/ML IV SOLN
5.0000 mL | Freq: Once | INTRAVENOUS | Status: AC | PRN
  Administered 2022-04-28: 5 mL via INTRAVENOUS

## 2022-04-29 ENCOUNTER — Other Ambulatory Visit: Payer: Self-pay | Admitting: Family Medicine

## 2022-04-29 DIAGNOSIS — G8929 Other chronic pain: Secondary | ICD-10-CM

## 2022-04-29 NOTE — Telephone Encounter (Signed)
Last refill: 04/02/2022 #120 with 0 refills  Last office visit: 04/10/2022 Next office visit: no future visit scheduled

## 2022-05-01 MED ORDER — OXYCODONE HCL 15 MG PO TABS: 15.0000 mg | ORAL_TABLET | Freq: Four times a day (QID) | ORAL | 0 refills | Status: DC

## 2022-05-01 NOTE — Telephone Encounter (Signed)
Pt called asking about her rx oxycodone.    She will be out tomorrow.  [phone]

## 2022-05-06 ENCOUNTER — Encounter: Payer: Medicaid Other | Admitting: Obstetrics and Gynecology

## 2022-05-06 DIAGNOSIS — Z1231 Encounter for screening mammogram for malignant neoplasm of breast: Secondary | ICD-10-CM

## 2022-05-06 DIAGNOSIS — Z124 Encounter for screening for malignant neoplasm of cervix: Secondary | ICD-10-CM

## 2022-05-06 DIAGNOSIS — Z01419 Encounter for gynecological examination (general) (routine) without abnormal findings: Secondary | ICD-10-CM

## 2022-05-08 ENCOUNTER — Telehealth: Payer: Self-pay | Admitting: *Deleted

## 2022-05-08 NOTE — Telephone Encounter (Signed)
PA was done for oxycodone and was denied.

## 2022-05-19 ENCOUNTER — Encounter: Payer: Medicaid Other | Admitting: Obstetrics and Gynecology

## 2022-05-19 DIAGNOSIS — R7989 Other specified abnormal findings of blood chemistry: Secondary | ICD-10-CM

## 2022-05-20 ENCOUNTER — Encounter: Payer: Self-pay | Admitting: Obstetrics and Gynecology

## 2022-05-20 ENCOUNTER — Telehealth: Payer: Self-pay | Admitting: Obstetrics and Gynecology

## 2022-05-20 ENCOUNTER — Ambulatory Visit: Payer: Medicaid Other | Admitting: Obstetrics and Gynecology

## 2022-05-20 VITALS — BP 111/78 | HR 106 | Ht 65.0 in | Wt 170.8 lb

## 2022-05-20 DIAGNOSIS — F5231 Female orgasmic disorder: Secondary | ICD-10-CM

## 2022-05-20 DIAGNOSIS — R7989 Other specified abnormal findings of blood chemistry: Secondary | ICD-10-CM

## 2022-05-20 MED ORDER — ESTROGENS CONJUGATED 0.625 MG/GM VA CREA: TOPICAL_CREAM | VAGINAL | 1 refills | Status: DC

## 2022-05-20 MED ORDER — CABERGOLINE 0.5 MG PO TABS: 0.2500 mg | ORAL_TABLET | ORAL | 0 refills | Status: AC

## 2022-05-20 MED ORDER — ESTRADIOL 0.1 MG/GM VA CREA: 0.2500 | TOPICAL_CREAM | Freq: Every day | VAGINAL | 3 refills | Status: DC

## 2022-05-20 NOTE — Addendum Note (Signed)
Addended by: Marykay Lex on: 05/20/2022 04:48 PM   Modules accepted: Orders

## 2022-05-20 NOTE — Progress Notes (Signed)
Patient presents today to discuss elevated prolactin levels. She states stopping majority of medications in early May but still experiencing breast numbness, high anxiety, dizziness, hot flashes and anger. She states having an MRI on 04/28/22 that was normal, patient states never having mammogram.

## 2022-05-20 NOTE — Telephone Encounter (Signed)
Pharmacy called and states that RX Estrace vaginal cream is not covered by pts insurance. Pharmacy is requesting an alternative RX of Premarin Cream 0.'625mg'$  per gram vaginal cream or RX Vagifem tablets '10mg'$ . Pharmacy states if estrace is what MD prefers for patient pharmacy has sent a prior authorization through cover my meds for approval. Please advise

## 2022-05-20 NOTE — Addendum Note (Signed)
Addended by: Marykay Lex on: 05/20/2022 04:40 PM   Modules accepted: Orders

## 2022-05-20 NOTE — Progress Notes (Signed)
HPI:      Martha Guerra is a 41 y.o. G2P1011 who LMP was Patient's last menstrual period was 05/10/2022.  Subjective:   She presents today stating that she continues to experience bilateral breast tenderness.  She states that she went to her psychiatrist and he does not believe that her medications are causing her elevated prolactin.  Nevertheless she has stopped most of her antipsychotic medications.  She continues to have an elevated prolactin. She used Premarin vaginal cream and she is "not sure if it worked or not because she did not have enough".  She would like to continue using small amounts of estrogen vaginal cream which may help with her anorgasmia.    Hx: The following portions of the patient's history were reviewed and updated as appropriate:             She  has a past medical history of Anxiety, Asthma, Chronic constipation, Cystitides, interstitial, chronic, Depression, Gastritis, GERD (gastroesophageal reflux disease), History of kidney stones, and History of ovarian cyst. She does not have any pertinent problems on file. She  has a past surgical history that includes cystectomy and Ovarian cyst surgery. Her family history includes Heart attack in her mother; Hypertension in her father. She  reports that she has been smoking cigarettes. She has a 8.50 pack-year smoking history. She has never used smokeless tobacco. She reports that she does not drink alcohol and does not use drugs. She has a current medication list which includes the following prescription(s): alprazolam, amphetamine-dextroamphetamine, [START ON 05/21/2022] cabergoline, estradiol, oxycodone, fluticasone, lamotrigine, lidocaine, lidocaine, loratadine, trintellix, venlafaxine xr, and vitamin d (ergocalciferol). She is allergic to prednisone and acetaminophen.       Review of Systems:  Review of Systems  Constitutional: Denied constitutional symptoms, night sweats, recent illness, fatigue, fever, insomnia and  weight loss.  Eyes: Denied eye symptoms, eye pain, photophobia, vision change and visual disturbance.  Ears/Nose/Throat/Neck: Denied ear, nose, throat or neck symptoms, hearing loss, nasal discharge, sinus congestion and sore throat.  Cardiovascular: Denied cardiovascular symptoms, arrhythmia, chest pain/pressure, edema, exercise intolerance, orthopnea and palpitations.  Respiratory: Denied pulmonary symptoms, asthma, pleuritic pain, productive sputum, cough, dyspnea and wheezing.  Gastrointestinal: Denied, gastro-esophageal reflux, melena, nausea and vomiting.  Genitourinary: See HPI for additional information.  Musculoskeletal: Denied musculoskeletal symptoms, stiffness, swelling, muscle weakness and myalgia.  Dermatologic: Denied dermatology symptoms, rash and scar.  Neurologic: Denied neurology symptoms, dizziness, headache, neck pain and syncope.  Psychiatric: Denied psychiatric symptoms, anxiety and depression.  Endocrine: See HPI for additional information.   Meds:   Current Outpatient Medications on File Prior to Visit  Medication Sig Dispense Refill   alprazolam (XANAX) 2 MG tablet TAKE 1/2 TABLET BY MOUTH 4 TIMES DAILY MAX 2 TABLETS PER DAY 60 tablet 2   amphetamine-dextroamphetamine (ADDERALL) 10 MG tablet Take by mouth.     oxyCODONE (ROXICODONE) 15 MG immediate release tablet Take 1 tablet (15 mg total) by mouth in the morning, at noon, in the evening, and at bedtime. 120 tablet 0   fluticasone (FLONASE) 50 MCG/ACT nasal spray Place 2 sprays into both nostrils daily. (Patient not taking: Reported on 05/20/2022) 16 g 1   lamoTRIgine (LAMICTAL) 100 MG tablet  (Patient not taking: Reported on 05/20/2022)  4   lidocaine (XYLOCAINE) 2 % jelly 1 application as needed. (Patient not taking: Reported on 05/20/2022)     lidocaine (XYLOCAINE) 5 % ointment Apply topically every 12 (twelve) hours as needed. (Patient not taking: Reported on  05/20/2022) 35.44 g 3   loratadine (CLARITIN) 10 MG tablet  Take 1 tablet (10 mg total) by mouth daily. (Patient not taking: Reported on 05/20/2022) 90 tablet 1   TRINTELLIX 20 MG TABS  (Patient not taking: Reported on 05/20/2022)  4   venlafaxine XR (EFFEXOR-XR) 37.5 MG 24 hr capsule TAKE 1 CAPSULE BY MOUTH ONCE EVERY MORNING WITH BREAKFAST (Patient not taking: Reported on 05/20/2022) 30 capsule 2   Vitamin D, Ergocalciferol, (DRISDOL) 1.25 MG (50000 UNIT) CAPS capsule TAKE 1 CAPSULE BY MOUTH SAME DAY EACH WEEK (Patient not taking: Reported on 05/20/2022) 12 capsule 4   No current facility-administered medications on file prior to visit.      Objective:     Vitals:   05/20/22 1420  BP: 111/78  Pulse: (!) 106   Filed Weights   05/20/22 1420  Weight: 170 lb 12.8 oz (77.5 kg)                        Assessment:    G2P1011 Patient Active Problem List   Diagnosis Date Noted   Elevated prolactin level 03/27/2022   Frequent headaches 03/27/2022   Dizziness 03/27/2022   ADHD 12/03/2021   Social phobia 12/03/2021   Chronic pain disorder 11/28/2021   Rash 11/24/2021   Vitamin D deficiency 12/26/2018   Chronic bilateral low back pain with bilateral sciatica (Primary Area of Pain) (L>R) 12/20/2018   Chronic pain of both lower extremities (Secondary Area of Pain) disease LR 12/20/2018   Chronic neck pain (Tertiary Area of Pain) (R>L) 12/20/2018   Chronic female pelvic pain (Fourth Area of Pain) 12/20/2018   Long term current use of opiate analgesic 12/20/2018   Long term prescription benzodiazepine use 12/20/2018   Neck pain 08/30/2018   Lumbar herniated disc 08/30/2018   Insomnia 07/15/2015   H/O urinary stone 07/15/2015   Gastric catarrh 07/15/2015   Fibromyalgia 11/13/2014   Panic disorder 08/07/2014   GERD (gastroesophageal reflux disease) 08/07/2014   Compulsive tobacco user syndrome 08/07/2014   Bipolar disorder ([place]) 08/07/2014   Constipation 08/07/2014   Tobacco use disorder 08/07/2014   Chronic obstructive pulmonary disease ([place])  08/07/2014   Esophageal reflux 08/07/2014   Palpitations 06/13/2013   Hematuria 06/29/2012   Myalgia and myositis 06/29/2012   High-tone pelvic floor dysfunction 04/27/2012   Interstitial cystitis (chronic) without hematuria 08/19/2011   Dysuria 08/19/2011   Dyspareunia 08/19/2011   Nocturia 08/19/2011   Reduced libido 08/19/2011   Urinary urgency 08/19/2011     1. Elevated prolactin level   2. Anorgasmia of female        Plan:            1.  Dostinex for hyperprolactinemia.  We will try a 6-week trial.  Patient to inform us of her breast symptoms resolved.  She is also to have a prolactin level performed at 6 weeks.  2.  Continue Estrace vaginal cream for anorgasmia. Orders Orders Placed This Encounter  Procedures   Prolactin     Meds ordered this encounter  Medications   estradiol (ESTRACE) 0.1 MG/GM vaginal cream    Sig: Place 1.44 Applicatorfuls vaginally at bedtime.    Dispense:  90 g    Refill:  3   cabergoline (DOSTINEX) 0.5 MG tablet    Sig: Take 0.5 tablets (0.25 mg total) by mouth 2 (two) times a week.    Dispense:  10 tablet    Refill:  0  F/U  Return in about 6 weeks (around 07/01/2022). I spent 25 minutes involved in the care of this patient preparing to see the patient by obtaining and reviewing her medical history (including labs, imaging tests and prior procedures), documenting clinical information in the electronic health record (EHR), counseling and coordinating care plans, writing and sending prescriptions, ordering tests or procedures and in direct communicating with the patient and medical staff discussing pertinent items from her history and physical exam.  Finis Bud, M.D. 05/20/2022 2:52 PM

## 2022-05-21 NOTE — Telephone Encounter (Signed)
Correct medication sent to pharmacy per Dr. Amalia Hailey

## 2022-05-22 NOTE — Telephone Encounter (Signed)
Called patient no answer, left a hippa compliant VM informing patient medication has been sent to pharmacy.

## 2022-05-25 ENCOUNTER — Telehealth: Payer: Self-pay | Admitting: Obstetrics and Gynecology

## 2022-05-25 NOTE — Telephone Encounter (Signed)
Pt called she is confused about medications- believes that she took medication incorrectly. Please advise.

## 2022-05-26 ENCOUNTER — Ambulatory Visit: Payer: Self-pay

## 2022-05-26 ENCOUNTER — Telehealth: Payer: Self-pay

## 2022-05-26 NOTE — Telephone Encounter (Addendum)
Prior Auth submitted for Estradiol 05/26/22

## 2022-05-26 NOTE — Telephone Encounter (Signed)
  Chief Complaint: Collapsed nostril Symptoms: Right nostril is higher than left.  Frequency: Ongoing issues - Nostril collapsed today Pertinent Negatives: Patient denies pain inability to breath Disposition: '[]'$ ED /'[]'$ Urgent Care (no appt availability in office) / '[x]'$ Appointment(In office/virtual)/ '[]'$  [place] [facility]/ '[]'$ Home Care/ '[]'$ Refused Recommended Disposition /'[]'$ [place] [facility]/ '[]'$  Follow-up with PCP Additional Notes: Pt has had long term issues with her nose/nostrils. PT was seen at ENT 2 years ago for this issue. Pt has been using BreathRight strips to open nostril. When the stickiness of the strip is gone right nostril falls in. PT has no trouble breathing . Pt will seek immediate help if needed.    Reason for Disposition  Breathing through the nose is blocked on one side or both sides  Answer Assessment - Initial Assessment Questions 1. MECHANISM: "How did the injury happen?"      Today 2. ONSET: "When did the injury happen?" (Minutes or hours ago)      Removed breath right strip  3. LOCATION: "What part of the nose is injured?"      Right nostril 4. APPEARANCE of INJURY: "What does the nose look like?"      *No Answer* 5. BLEEDING: "Is the nose still bleeding?" If Yes, ask: "Is it difficult to stop?"      *No Answer* 6. SIZE: For cuts, bruises, or swelling, ask: "How large is it?" (e.g., inches or centimeters;  entire nose)      *No Answer* 7. PAIN: "Is it painful?" If Yes, ask: "How bad is the pain?"   (Scale 1-10; or mild, moderate, severe)     *No Answer* 8. TETANUS: For any breaks in the skin, ask: "When was the last tetanus booster?"     *No Answer* 9. OTHER SYMPTOMS: "Do you have any other symptoms?" (e.g., headache, neck pain, loss of consciousness)     *No Answer* 10. PREGNANCY: "Is there any chance you are pregnant?" "When was your last menstrual period?"       *No Answer*  Protocols used: Nose Injury-A-AH

## 2022-05-26 NOTE — Telephone Encounter (Signed)
Attempted to call patient, Martha Guerra

## 2022-05-27 ENCOUNTER — Ambulatory Visit: Payer: Medicaid Other | Admitting: Family Medicine

## 2022-05-27 NOTE — Progress Notes (Deleted)
    SUBJECTIVE:   CHIEF COMPLAINT / HPI:   NOSE COMPLAINT - h/o R nostril collapsing - seen by ENT few years ago  OBJECTIVE:   LMP 05/10/2022   ***  ASSESSMENT/PLAN:   No problem-specific Assessment & Plan notes found for this encounter.     Myles Gip, DO

## 2022-05-27 NOTE — Telephone Encounter (Signed)
Patient was scheduled to see Dr. Ky Barban today at 11:20 am but No show.

## 2022-05-27 NOTE — Telephone Encounter (Signed)
Attempted to reach patient to discuss medication. LVM for her to call back.

## 2022-05-28 NOTE — Telephone Encounter (Signed)
Attempted to reach patient 3 times, LVM. MyChart message also sent. Closing encounter

## 2022-05-28 NOTE — Telephone Encounter (Signed)
Prior Auth received from [facility]. Josem Kaufmann has been denied. Several attempts were made to contact patient regarding this, she has not contacted the office back. MyChart message sent.

## 2022-05-31 ENCOUNTER — Other Ambulatory Visit: Payer: Self-pay | Admitting: Family Medicine

## 2022-05-31 DIAGNOSIS — G8929 Other chronic pain: Secondary | ICD-10-CM

## 2022-05-31 MED ORDER — OXYCODONE HCL 15 MG PO TABS: 15.0000 mg | ORAL_TABLET | Freq: Four times a day (QID) | ORAL | 0 refills | Status: DC

## 2022-06-28 ENCOUNTER — Other Ambulatory Visit: Payer: Self-pay | Admitting: Family Medicine

## 2022-06-28 DIAGNOSIS — G8929 Other chronic pain: Secondary | ICD-10-CM

## 2022-06-29 MED ORDER — OXYCODONE HCL 15 MG PO TABS: 15.0000 mg | ORAL_TABLET | Freq: Four times a day (QID) | ORAL | 0 refills | Status: DC

## 2022-07-06 DIAGNOSIS — F900 Attention-deficit hyperactivity disorder, predominantly inattentive type: Secondary | ICD-10-CM | POA: Diagnosis not present

## 2022-07-06 DIAGNOSIS — F410 Panic disorder [episodic paroxysmal anxiety] without agoraphobia: Secondary | ICD-10-CM | POA: Diagnosis not present

## 2022-07-07 ENCOUNTER — Telehealth: Payer: Medicaid Other | Admitting: Obstetrics and Gynecology

## 2022-07-23 ENCOUNTER — Other Ambulatory Visit: Payer: Self-pay | Admitting: Family Medicine

## 2022-07-23 ENCOUNTER — Other Ambulatory Visit: Payer: Self-pay | Admitting: Obstetrics and Gynecology

## 2022-07-23 DIAGNOSIS — R7989 Other specified abnormal findings of blood chemistry: Secondary | ICD-10-CM

## 2022-07-23 DIAGNOSIS — F411 Generalized anxiety disorder: Secondary | ICD-10-CM

## 2022-07-24 NOTE — Telephone Encounter (Signed)
Patient made aware lab work is needed prior to refill. Advised to contact the office to schedule.

## 2022-07-27 ENCOUNTER — Other Ambulatory Visit: Payer: Self-pay | Admitting: Family Medicine

## 2022-07-27 DIAGNOSIS — G8929 Other chronic pain: Secondary | ICD-10-CM

## 2022-07-28 ENCOUNTER — Telehealth: Payer: Self-pay

## 2022-07-28 ENCOUNTER — Ambulatory Visit: Payer: Self-pay | Admitting: *Deleted

## 2022-07-28 DIAGNOSIS — R7989 Other specified abnormal findings of blood chemistry: Secondary | ICD-10-CM

## 2022-07-28 MED ORDER — OXYCODONE HCL 15 MG PO TABS: 15.0000 mg | ORAL_TABLET | Freq: Four times a day (QID) | ORAL | 0 refills | Status: DC

## 2022-07-28 NOTE — Telephone Encounter (Signed)
Summary: dizziness   The patient shares that they are experiencing significant dizziness they believe is related to their cabergoline (DOSTINEX) 0.5 MG tablet [[id number]]  prescription   The patient shares that within 30 minutes of taking the medication the patient begins to experience dizziness, nausea and and discomfort   The patient has experienced dizziness for multiple months and shares that the medication was supposed to treat their dizziness that they were experiencing but they believe it increases it   Please contact further when possible         Chief Complaint: dizziness and requesting lab work to check prolactin levels Symptoms: dizziness esp. After taking medication cabergoline. C/o dizziness affecting everyday life. S/E : nausea, vomiting. Numbness head, scalp, arms, legs. Reports no sensation of pain Frequency: since Feb.  Pertinent Negatives: Patient denies chest pain no difficulty breathing. No fever reported  Disposition: '[]'$ ED /'[]'$ Urgent Care (no appt availability in office) / '[]'$ Appointment(In office/virtual)/ '[]'$  [place] [facility]/ '[]'$ Home Care/ '[]'$ Refused Recommended Disposition /'[]'$ [place] [facility]/ '[x]'$  Follow-up with PCP Additional Notes:   Recommended to schedule appt. Patient declined and requesting if PCP will order lab work to check prolactin levels. Recommended patient to call OBGYN as well. Patient reports she would like to see PCP instead. Please advise. Patient would like a call back.     Reason for Disposition  [1] MODERATE dizziness (e.g., interferes with normal activities) AND [2] has been evaluated by doctor (or NP/PA) for this  Answer Assessment - Initial Assessment Questions 1. DESCRIPTION: "Describe your dizziness."     Feels like passing out and room spinning at times 2. LIGHTHEADED: "Do you feel lightheaded?" (e.g., somewhat faint, woozy, weak upon standing)     Weak when standing or bending over 3. VERTIGO: "Do you feel like either you  or the room is spinning or tilting?" (i.e. vertigo)     Sometimes  4. SEVERITY: "How bad is it?"  "Do you feel like you are going to faint?" "Can you stand and walk?"   - MILD: Feels slightly dizzy, but walking normally.   - MODERATE: Feels unsteady when walking, but not falling; interferes with normal activities (e.g., school, work).   - SEVERE: Unable to walk without falling, or requires assistance to walk without falling; feels like passing out now.      Can walk , dizziness worsens after taking medication cabergoline 5. ONSET:  "When did the dizziness begin?"     Since Feb 6. AGGRAVATING FACTORS: "Does anything make it worse?" (e.g., standing, change in head position)     Standing and bending over caused room spinning.  7. HEART RATE: "Can you tell me your heart rate?" "How many beats in 15 seconds?"  (Note: not all patients can do this)       Na  8. CAUSE: "What do you think is causing the dizziness?"     Not sure worsening after taking medication cabergoline  9. RECURRENT SYMPTOM: "Have you had dizziness before?" If Yes, ask: "When was the last time?" "What happened that time?"     Yes , not sure what prolactin levels are 10. OTHER SYMPTOMS: "Do you have any other symptoms?" (e.g., fever, chest pain, vomiting, diarrhea, bleeding)       Feels like vomiting,  nausea,  numbness in head, scalp, arms, legs does not feel sensation of pain 11. PREGNANCY: "Is there any chance you are pregnant?" "When was your last menstrual period?"       na  Protocols used:  Dizziness - Lightheadedness-A-AH

## 2022-07-28 NOTE — Telephone Encounter (Signed)
We can check prolactin level, but I don't know anything about that medication and suggest she talk to the prescribing physician about the side effects.

## 2022-07-29 NOTE — Telephone Encounter (Signed)
Lmtcb. Please advise as below.

## 2022-07-29 NOTE — Telephone Encounter (Signed)
Attempted to reach pt, left VM to call back. 

## 2022-07-30 ENCOUNTER — Other Ambulatory Visit: Payer: Self-pay

## 2022-07-30 ENCOUNTER — Ambulatory Visit: Payer: Self-pay

## 2022-07-30 DIAGNOSIS — R7989 Other specified abnormal findings of blood chemistry: Secondary | ICD-10-CM

## 2022-07-30 NOTE — Telephone Encounter (Signed)
FYI Spoke to patient and advised as per Dr. Jacinto Reap recommendations. Dr. B recommended for patient to be seen at Urgent care as we don't have any appt available in office. Patient also advise to hydrate well. She reports she was drinking water in her car. I advised to try Sprite, ginger Ale, or Gatorade. Patient also reports that symptoms have been present on and off since starting premarin. That was started by Dr. Amalia Hailey. I did advise for patient to reach out to Dr. Amalia Hailey also for recommendations on medication. Patient reports she will go to [place] to have her BP checked also. Patient was advised to go to Urgent care or ED for persistent symptoms.

## 2022-07-30 NOTE — Telephone Encounter (Signed)
Patient notified of provider response- she would like to have prolactin level checked. Advised order is in chart

## 2022-07-30 NOTE — Telephone Encounter (Signed)
While speaking to pt, she received another call from [facility]. PT left this call to take call from clinic.  Pt was seen in for testing for prolactin levels today. Seen lab draw pt has been feeling lightheaded, and weak. Pt is still in parking lot since lunchtime. Pt remember feeling like this awhile back when she had a pregnancy loss. At that time her BP was very low.    Call dropped after holding.  Answer Assessment - Initial Assessment Questions 1. DESCRIPTION: "Describe your dizziness."     *No Answer* 2. LIGHTHEADED: "Do you feel lightheaded?" (e.g., somewhat faint, woozy, weak upon standing)     yes 3. VERTIGO: "Do you feel like either you or the room is spinning or tilting?" (i.e. vertigo)     *No Answer* 4. SEVERITY: "How bad is it?"  "Do you feel like you are going to faint?" "Can you stand and walk?"   - MILD: Feels slightly dizzy, but walking normally.   - MODERATE: Feels unsteady when walking, but not falling; interferes with normal activities (e.g., school, work).   - SEVERE: Unable to walk without falling, or requires assistance to walk without falling; feels like passing out now.      *No Answer* 5. ONSET:  "When did the dizziness begin?"     After lab draw today 6. AGGRAVATING FACTORS: "Does anything make it worse?" (e.g., standing, change in head position)     *No Answer* 7. HEART RATE: "Can you tell me your heart rate?" "How many beats in 15 seconds?"  (Note: not all patients can do this)       *No Answer* 8. CAUSE: "What do you think is causing the dizziness?"     Perhaps low BP 9. RECURRENT SYMPTOM: "Have you had dizziness before?" If Yes, ask: "When was the last time?" "What happened that time?"     *No Answer* 10. OTHER SYMPTOMS: "Do you have any other symptoms?" (e.g., fever, chest pain, vomiting, diarrhea, bleeding)       *No Answer* 11. PREGNANCY: "Is there any chance you are pregnant?" "When was your last menstrual period?"       *No Answer*  Protocols used:  Dizziness - Lightheadedness-A-AH

## 2022-07-31 LAB — PROLACTIN: Prolactin: 5.2 ng/mL (ref 4.8–23.3)

## 2022-08-26 ENCOUNTER — Other Ambulatory Visit: Payer: Self-pay | Admitting: Family Medicine

## 2022-08-26 DIAGNOSIS — G8929 Other chronic pain: Secondary | ICD-10-CM

## 2022-08-26 MED ORDER — OXYCODONE HCL 15 MG PO TABS: 15.0000 mg | ORAL_TABLET | Freq: Four times a day (QID) | ORAL | 0 refills | Status: DC

## 2022-09-22 ENCOUNTER — Other Ambulatory Visit: Payer: Self-pay | Admitting: Family Medicine

## 2022-09-22 ENCOUNTER — Telehealth (INDEPENDENT_AMBULATORY_CARE_PROVIDER_SITE_OTHER): Payer: Self-pay | Admitting: Family Medicine

## 2022-09-22 DIAGNOSIS — Z91199 Patient's noncompliance with other medical treatment and regimen due to unspecified reason: Secondary | ICD-10-CM

## 2022-09-22 DIAGNOSIS — G8929 Other chronic pain: Secondary | ICD-10-CM

## 2022-09-22 NOTE — Progress Notes (Signed)
    MyChart Video Visit    Virtual Visit via Video Note   This format is felt to be most appropriate for this patient at this time. Physical exam was limited by quality of the video and audio technology used for the visit.     I discussed the limitations of evaluation and management by telemedicine and the availability of in person appointments. The patient expressed understanding and agreed to proceed.  Patient: Martha Guerra   DOB: 11-23-1980   41 y.o. Female  MRN: [id number] Visit Date: 09/22/2022  Today's healthcare provider: Lelon Huh, MD   No chief complaint on file.  Subjective    HPI  Patient has had COVID symptoms for 2+ weeks. Home covid test was negative. Also has symptoms of nasal drainage in excessive amounts (pours clear liquid at times, other times it's thick), chronic cough, fatigue. No longer has fever. Requesting a Z-pak..   Waited online for 20 minutes and resent Mychart visit link but patient never showed. Attempted several phone calls that went unanswered by patient.    Lelon Huh, MD [facility] [phone] (phone) [phone] (fax)  [place]

## 2022-09-23 MED ORDER — OXYCODONE HCL 15 MG PO TABS: 15.0000 mg | ORAL_TABLET | Freq: Four times a day (QID) | ORAL | 0 refills | Status: DC

## 2022-09-25 ENCOUNTER — Telehealth (INDEPENDENT_AMBULATORY_CARE_PROVIDER_SITE_OTHER): Payer: Medicaid Other | Admitting: Family Medicine

## 2022-09-25 DIAGNOSIS — J342 Deviated nasal septum: Secondary | ICD-10-CM | POA: Diagnosis not present

## 2022-09-25 DIAGNOSIS — J40 Bronchitis, not specified as acute or chronic: Secondary | ICD-10-CM | POA: Diagnosis not present

## 2022-09-25 MED ORDER — AZITHROMYCIN 250 MG PO TABS: ORAL_TABLET | ORAL | 0 refills | Status: AC

## 2022-09-25 NOTE — Progress Notes (Signed)
MyChart Video Visit    Virtual Visit via Video Note   This format is felt to be most appropriate for this patient at this time. Physical exam was limited by quality of the video and audio technology used for the visit.   Patient location: home Provider location: [facility]  I discussed the limitations of evaluation and management by telemedicine and the availability of in person appointments. The patient expressed understanding and agreed to proceed.  Patient: Martha Guerra   DOB: 12-30-1980   41 y.o. Female  MRN: [id number] Visit Date: 09/25/2022  Today's healthcare provider: Lelon Huh, MD   No chief complaint on file.  Subjective    HPI  States started having URI sx which seems like COVID 3 weeks ago with fever for a couple of weeks, but Covid test was negative. Having copious nasal drainage which is improving. Cough off and on, now coughing productive white or green mucous, feels like source of cough has moved from post nasal drainage into chest. No dyspnea.   Medications: Outpatient Medications Prior to Visit  Medication Sig   alprazolam (XANAX) 2 MG tablet TAKE 1/2 TABLET BY MOUTH 4 TIMES DAILY MAX 2 TABLETS PER DAY   amphetamine-dextroamphetamine (ADDERALL) 10 MG tablet Take by mouth.   conjugated estrogens (PREMARIN) vaginal cream 1 gram intravaginal BIW   fluticasone (FLONASE) 50 MCG/ACT nasal spray Place 2 sprays into both nostrils daily. (Patient not taking: Reported on 05/20/2022)   lamoTRIgine (LAMICTAL) 100 MG tablet  (Patient not taking: Reported on 05/20/2022)   lidocaine (XYLOCAINE) 2 % jelly 1 application as needed. (Patient not taking: Reported on 05/20/2022)   lidocaine (XYLOCAINE) 5 % ointment Apply topically every 12 (twelve) hours as needed. (Patient not taking: Reported on 05/20/2022)   loratadine (CLARITIN) 10 MG tablet Take 1 tablet (10 mg total) by mouth daily. (Patient not taking: Reported on 05/20/2022)   oxyCODONE (ROXICODONE) 15 MG immediate release  tablet Take 1 tablet (15 mg total) by mouth in the morning, at noon, in the evening, and at bedtime.   TRINTELLIX 20 MG TABS  (Patient not taking: Reported on 05/20/2022)   venlafaxine XR (EFFEXOR-XR) 37.5 MG 24 hr capsule TAKE 1 CAPSULE BY MOUTH ONCE EVERY MORNING WITH BREAKFAST (Patient not taking: Reported on 05/20/2022)   Vitamin D, Ergocalciferol, (DRISDOL) 1.25 MG (50000 UNIT) CAPS capsule TAKE 1 CAPSULE BY MOUTH SAME DAY EACH WEEK (Patient not taking: Reported on 05/20/2022)   No facility-administered medications prior to visit.        Objective    There were no vitals taken for this visit.     Physical Exam   Awake, alert, oriented x 3. In no apparent distress. Noted to have septum deviated to right.   Assessment & Plan     1. Bronchitis Sx x 3 week and progressing into chest.   - azithromycin (ZITHROMAX) 250 MG tablet; Take 2 tablets on day 1, then 1 tablet daily on days 2 through 5  Dispense: 6 tablet; Refill: 0  2. Deviated septum Evaluated by Dr. Richardson Landry years ago, but not surgically corrected. She states she has to wear nasal strips all the time to breath out of right nostril and would to be re-evaluated by ENT for definitive treatment.  - Ambulatory referral to ENT     I discussed the assessment and treatment plan with the patient. The patient was provided an opportunity to ask questions and all were answered. The patient agreed with the plan and demonstrated  an understanding of the instructions.   The patient was advised to call back or seek an in-person evaluation if the symptoms worsen or if the condition fails to improve as anticipated.  I provided 12 minutes of non-face-to-face time during this encounter.  The entirety of the information documented in the History of Present Illness, Review of Systems and Physical Exam were personally obtained by me. Portions of this information were initially documented by the CMA and reviewed by me for thoroughness and accuracy.     Lelon Huh, MD [facility] [phone] (phone) [phone] (fax)  [place]

## 2022-10-21 ENCOUNTER — Other Ambulatory Visit: Payer: Self-pay | Admitting: Family Medicine

## 2022-10-21 DIAGNOSIS — G8929 Other chronic pain: Secondary | ICD-10-CM

## 2022-10-22 MED ORDER — OXYCODONE HCL 15 MG PO TABS: 15.0000 mg | ORAL_TABLET | Freq: Four times a day (QID) | ORAL | 0 refills | Status: DC

## 2022-11-13 ENCOUNTER — Other Ambulatory Visit: Payer: Self-pay | Admitting: Family Medicine

## 2022-11-13 DIAGNOSIS — F411 Generalized anxiety disorder: Secondary | ICD-10-CM

## 2022-11-19 ENCOUNTER — Other Ambulatory Visit: Payer: Self-pay | Admitting: Family Medicine

## 2022-11-19 DIAGNOSIS — G8929 Other chronic pain: Secondary | ICD-10-CM

## 2022-11-19 NOTE — Telephone Encounter (Signed)
Please review. Last visit 09/25/2022.  KP

## 2022-11-21 MED ORDER — OXYCODONE HCL 15 MG PO TABS: 15.0000 mg | ORAL_TABLET | Freq: Four times a day (QID) | ORAL | 0 refills | Status: DC

## 2022-12-17 ENCOUNTER — Encounter: Payer: Self-pay | Admitting: Radiology

## 2022-12-18 ENCOUNTER — Telehealth: Payer: Self-pay | Admitting: Family Medicine

## 2022-12-18 DIAGNOSIS — G8929 Other chronic pain: Secondary | ICD-10-CM

## 2022-12-18 NOTE — Telephone Encounter (Signed)
She is overdue for follow up office visit. Needs to be seen within the next month. Cannot refill prescription until appt is scheduled

## 2022-12-21 ENCOUNTER — Other Ambulatory Visit: Payer: Self-pay | Admitting: Family Medicine

## 2022-12-21 ENCOUNTER — Other Ambulatory Visit: Payer: Self-pay

## 2022-12-21 DIAGNOSIS — G8929 Other chronic pain: Secondary | ICD-10-CM

## 2022-12-21 NOTE — Telephone Encounter (Signed)
Patient has appointment 01/08/23

## 2022-12-21 NOTE — Telephone Encounter (Signed)
I spoke with [facility]. They are able to accept e-prescribed Rxs now.   Patient is asking for you to send her Oxycodone(Roxi) 15 mg. To [facility].  She is totally out.

## 2022-12-21 NOTE — Telephone Encounter (Signed)
Medication Refill - Medication:   Has the patient contacted their pharmacy? Yes.    (Agent: If yes, when and what did the pharmacy advise?) Contact provider   Preferred Pharmacy (with phone number or street name): [facility] [place], [place].   Has the patient been seen for an appointment in the last year OR does the patient have an upcoming appointment? Yes.    Agent: Please be advised that RX refills may take up to 3 business days. We ask that you follow-up with your pharmacy.   Patient was informed her refill couldn't be filled until she had an appointment. Patient scheduled an appointment for 03/22.

## 2022-12-21 NOTE — Telephone Encounter (Signed)
Pt called back in to schedule an appt. Appt is scheduled for March 22nd. Pt is wanting to know if her medication can be called in she will be out of medication today since she scheduled her appt. Please advise

## 2022-12-22 ENCOUNTER — Other Ambulatory Visit: Payer: Self-pay | Admitting: Family Medicine

## 2022-12-22 ENCOUNTER — Other Ambulatory Visit: Payer: Self-pay

## 2022-12-22 DIAGNOSIS — G8929 Other chronic pain: Secondary | ICD-10-CM

## 2022-12-22 MED ORDER — OXYCODONE HCL 15 MG PO TABS: 15.0000 mg | ORAL_TABLET | Freq: Four times a day (QID) | ORAL | 0 refills | Status: DC

## 2022-12-22 NOTE — Addendum Note (Signed)
Addended by: Virginia Crews on: 12/22/2022 08:10 AM   Modules accepted: Orders

## 2023-01-08 ENCOUNTER — Ambulatory Visit: Payer: Medicaid Other | Admitting: Family Medicine

## 2023-01-08 ENCOUNTER — Other Ambulatory Visit (HOSPITAL_COMMUNITY)
Admission: RE | Admit: 2023-01-08 | Discharge: 2023-01-08 | Disposition: A | Discharge status: Home / Self Care | Payer: Medicaid Other | Source: Ambulatory Visit | Attending: Family Medicine | Admitting: Family Medicine

## 2023-01-08 VITALS — BP 144/81 | HR 98 | Temp 98.1°F | Wt 176.0 lb

## 2023-01-08 DIAGNOSIS — R300 Dysuria: Secondary | ICD-10-CM | POA: Diagnosis not present

## 2023-01-08 DIAGNOSIS — Z1159 Encounter for screening for other viral diseases: Secondary | ICD-10-CM

## 2023-01-08 DIAGNOSIS — Z113 Encounter for screening for infections with a predominantly sexual mode of transmission: Secondary | ICD-10-CM | POA: Insufficient documentation

## 2023-01-08 DIAGNOSIS — E559 Vitamin D deficiency, unspecified: Secondary | ICD-10-CM

## 2023-01-08 DIAGNOSIS — M5441 Lumbago with sciatica, right side: Secondary | ICD-10-CM

## 2023-01-08 DIAGNOSIS — G8929 Other chronic pain: Secondary | ICD-10-CM | POA: Diagnosis not present

## 2023-01-08 DIAGNOSIS — E221 Hyperprolactinemia: Secondary | ICD-10-CM | POA: Diagnosis not present

## 2023-01-08 DIAGNOSIS — M5442 Lumbago with sciatica, left side: Secondary | ICD-10-CM | POA: Diagnosis not present

## 2023-01-08 DIAGNOSIS — Z1231 Encounter for screening mammogram for malignant neoplasm of breast: Secondary | ICD-10-CM

## 2023-01-08 DIAGNOSIS — Z136 Encounter for screening for cardiovascular disorders: Secondary | ICD-10-CM | POA: Diagnosis not present

## 2023-01-08 NOTE — Patient Instructions (Signed)
Please review the attached list of medications and notify my office if there are any errors.  ? ?Please call the [facility] ([phone]) to schedule a routine screening mammogram. ? ?

## 2023-01-08 NOTE — Progress Notes (Unsigned)
  suture removal     Established patient visit   Patient: Martha Guerra   DOB: 12-Oct-1981   42 y.o. Female  MRN: [id number] Visit Date: 01/08/2023  Today's healthcare provider: Lelon Huh, MD   No chief complaint on file.  Subjective    HPI  Patient would like to get referral for ENT.  She had not gone when it was ordered in December. She also needs referral back to gyn for follow up of elevated Prolactin level.  Patient is a 42 year old female who presents today for follow up of pain medication.  She states she takes opioids for back pain.  Patient states her pain scale at the time she feels she needs to take her pain medication is a 3 and after taking medication it goes down to 'numb'  Medications: Outpatient Medications Prior to Visit  Medication Sig   alprazolam (XANAX) 2 MG tablet TAKE 1/2 TABLET BY MOUTH 4 TIMES DAILY MAX 2 TABLETS PER DAY   amphetamine-dextroamphetamine (ADDERALL) 10 MG tablet Take by mouth.   conjugated estrogens (PREMARIN) vaginal cream 1 gram intravaginal BIW   fluticasone (FLONASE) 50 MCG/ACT nasal spray Place 2 sprays into both nostrils daily.   lamoTRIgine (LAMICTAL) 100 MG tablet    lidocaine (XYLOCAINE) 2 % jelly 1 application  as needed.   lidocaine (XYLOCAINE) 5 % ointment Apply topically every 12 (twelve) hours as needed.   loratadine (CLARITIN) 10 MG tablet Take 1 tablet (10 mg total) by mouth daily.   oxyCODONE (ROXICODONE) 15 MG immediate release tablet Take 1 tablet (15 mg total) by mouth in the morning, at noon, in the evening, and at bedtime.   TRINTELLIX 20 MG TABS    venlafaxine XR (EFFEXOR-XR) 37.5 MG 24 hr capsule TAKE 1 CAPSULE BY MOUTH ONCE EVERY MORNING WITH BREAKFAST   Vitamin D, Ergocalciferol, (DRISDOL) 1.25 MG (50000 UNIT) CAPS capsule TAKE 1 CAPSULE BY MOUTH SAME DAY EACH WEEK   No facility-administered medications prior to visit.    Review of Systems  Musculoskeletal:  Positive for back pain and gait problem.  Negative for arthralgias, joint swelling and myalgias.    {Labs  Heme  Chem  Endocrine  Serology  Results Review (optional):23779}   Objective    BP (!) 144/81 (BP Location: Left Arm, Patient Position: Sitting, Cuff Size: Normal)   Pulse 98   Temp 98.1 F (36.7 C) (Oral)   Wt 176 lb (79.8 kg)   SpO2 100%   BMI 29.29 kg/m  {Show previous vital signs (optional):23777}  Physical Exam  ***  No results found for any visits on 01/08/23.  Assessment & Plan     ***  No follow-ups on file.      {provider attestation***:1}   Lelon Huh, MD  [place] [phone] (phone) [phone] (fax)  [place]

## 2023-01-09 LAB — COMPREHENSIVE METABOLIC PANEL
ALT: 15 IU/L (ref 0–32)
AST: 19 IU/L (ref 0–40)
Alkaline Phosphatase: 108 IU/L (ref 44–121)
Bilirubin Total: <0.2 mg/dL (ref 0.0–1.2)
Calcium: 9.2 mg/dL (ref 8.7–10.2)
Glucose: 94 mg/dL (ref 70–99)
Potassium: 4.2 mmol/L (ref 3.5–5.2)
Total Protein: 6.6 g/dL (ref 6.0–8.5)
eGFR: 115 mL/min/{1.73_m2} (ref >59)

## 2023-01-09 LAB — VITAMIN D 25 HYDROXY (VIT D DEFICIENCY, FRACTURES): Vit D, 25-Hydroxy: 20.4 ng/mL — ABNORMAL LOW (ref 30.0–100.0)

## 2023-01-09 LAB — PREGNANCY, URINE: Preg Test, Ur: NEGATIVE

## 2023-01-09 LAB — HIV ANTIBODY (ROUTINE TESTING W REFLEX): HIV Screen 4th Generation wRfx: NONREACTIVE

## 2023-01-09 LAB — LIPID PANEL
Chol/HDL Ratio: 3.7 ratio (ref 0.0–4.4)
Cholesterol, Total: 179 mg/dL (ref 100–199)
HDL: 48 mg/dL (ref >39)
Triglycerides: 55 mg/dL (ref 0–149)
VLDL Cholesterol Cal: 11 mg/dL (ref 5–40)

## 2023-01-09 LAB — RPR: RPR Ser Ql: NONREACTIVE

## 2023-01-09 LAB — HEPATITIS C ANTIBODY: Hep C Virus Ab: NONREACTIVE

## 2023-01-09 LAB — HEPATITIS B CORE AB W/REFLEX: Hep B Core Total Ab: NEGATIVE

## 2023-01-09 LAB — PROLACTIN: Prolactin: 3.5 ng/mL — ABNORMAL LOW (ref 4.8–33.4)

## 2023-01-09 LAB — SPECIMEN STATUS REPORT

## 2023-01-10 LAB — PAIN MGT SCRN (14 DRUGS), UR
Amphetamine Scrn, Ur: POSITIVE ng/mL — AB
BARBITURATE SCREEN URINE: NEGATIVE ng/mL
BENZODIAZEPINE SCREEN, URINE: POSITIVE ng/mL — AB
Buprenorphine, Urine: NEGATIVE ng/mL
CANNABINOIDS UR QL SCN: NEGATIVE ng/mL
Cocaine (Metab) Scrn, Ur: NEGATIVE ng/mL
Creatinine(Crt), U: 100.4 mg/dL (ref 20.0–300.0)
Fentanyl, Urine: NEGATIVE pg/mL
Meperidine Screen, Urine: NEGATIVE ng/mL
Methadone Screen, Urine: NEGATIVE ng/mL
OXYCODONE+OXYMORPHONE UR QL SCN: POSITIVE ng/mL — AB
Opiate Scrn, Ur: POSITIVE ng/mL — AB
Ph of Urine: 7.1 (ref 4.5–8.9)
Phencyclidine Qn, Ur: NEGATIVE ng/mL
Propoxyphene Scrn, Ur: NEGATIVE ng/mL
Tramadol Screen, Urine: NEGATIVE ng/mL

## 2023-01-10 LAB — SPECIMEN STATUS REPORT

## 2023-01-13 LAB — URINE CYTOLOGY ANCILLARY ONLY
Bacterial Vaginitis-Urine: NEGATIVE
Candida Urine: NEGATIVE
Chlamydia: NEGATIVE
Comment: NEGATIVE
Comment: NEGATIVE
Comment: NORMAL
Neisseria Gonorrhea: NEGATIVE
Trichomonas: NEGATIVE

## 2023-01-20 ENCOUNTER — Other Ambulatory Visit: Payer: Self-pay

## 2023-01-20 ENCOUNTER — Telehealth: Payer: Self-pay | Admitting: Family Medicine

## 2023-01-20 ENCOUNTER — Other Ambulatory Visit: Payer: Self-pay | Admitting: Family Medicine

## 2023-01-20 DIAGNOSIS — G8929 Other chronic pain: Secondary | ICD-10-CM

## 2023-01-20 MED ORDER — OXYCODONE HCL 15 MG PO TABS: 15.0000 mg | ORAL_TABLET | Freq: Four times a day (QID) | ORAL | 0 refills | Status: DC

## 2023-01-20 MED ORDER — OXYCODONE HCL 15 MG PO TABS
15.0000 mg | ORAL_TABLET | Freq: Four times a day (QID) | ORAL | 0 refills | Status: DC
  Filled 2023-01-20: qty 120, 30d supply, fill #0

## 2023-01-20 NOTE — Telephone Encounter (Signed)
Pt is calling to request if she can pick a paper script for the Rx #: [id number]  oxyCODONE (ROXICODONE) 15 MG immediate  [phone] advise  when ready.

## 2023-02-06 ENCOUNTER — Other Ambulatory Visit: Payer: Self-pay | Admitting: Family Medicine

## 2023-02-06 DIAGNOSIS — F411 Generalized anxiety disorder: Secondary | ICD-10-CM

## 2023-02-19 ENCOUNTER — Other Ambulatory Visit: Payer: Self-pay | Admitting: Family Medicine

## 2023-02-19 DIAGNOSIS — G8929 Other chronic pain: Secondary | ICD-10-CM

## 2023-02-19 MED ORDER — OXYCODONE HCL 15 MG PO TABS
15.0000 mg | ORAL_TABLET | Freq: Four times a day (QID) | ORAL | 0 refills | Status: DC
Start: 2023-02-19 — End: 2023-03-22

## 2023-03-22 ENCOUNTER — Other Ambulatory Visit: Payer: Self-pay | Admitting: Family Medicine

## 2023-03-22 DIAGNOSIS — G8929 Other chronic pain: Secondary | ICD-10-CM

## 2023-03-22 MED ORDER — OXYCODONE HCL 15 MG PO TABS
15.0000 mg | ORAL_TABLET | Freq: Four times a day (QID) | ORAL | 0 refills | Status: DC
Start: 2023-03-22 — End: 2023-04-20

## 2023-03-25 NOTE — Progress Notes (Deleted)
    I,Elaina Cara,acting as a Neurosurgeon for [employer], PA-C.,have documented all relevant documentation on the behalf of Alfredia Ferguson, PA-C,as directed by  Alfredia Ferguson, PA-C while in the presence of Alfredia Ferguson, PA-C.    Established patient visit   Patient: Martha Guerra   DOB: 20-Mar-1981   42 y.o. Female  MRN: [id number] Visit Date: 03/26/2023  Today's healthcare provider: Alfredia Ferguson, PA-C   No chief complaint on file.  Subjective    HPI  ***  Medications: Outpatient Medications Prior to Visit  Medication Sig   alprazolam (XANAX) 2 MG tablet TAKE 1/2 TABLET BY MOUTH 4 TIMES DAILY MAX 2 TABLETS PER DAY   amphetamine-dextroamphetamine (ADDERALL) 10 MG tablet Take by mouth.   conjugated estrogens (PREMARIN) vaginal cream 1 gram intravaginal BIW   fluticasone (FLONASE) 50 MCG/ACT nasal spray Place 2 sprays into both nostrils daily.   lamoTRIgine (LAMICTAL) 100 MG tablet    lidocaine (XYLOCAINE) 2 % jelly 1 application  as needed.   lidocaine (XYLOCAINE) 5 % ointment Apply topically every 12 (twelve) hours as needed.   loratadine (CLARITIN) 10 MG tablet Take 1 tablet (10 mg total) by mouth daily.   oxyCODONE (ROXICODONE) 15 MG immediate release tablet Take 1 tablet (15 mg total) by mouth in the morning, at noon, in the evening, and at bedtime.   TRINTELLIX 20 MG TABS    venlafaxine XR (EFFEXOR-XR) 37.5 MG 24 hr capsule TAKE 1 CAPSULE BY MOUTH ONCE EVERY MORNING WITH BREAKFAST   Vitamin D, Ergocalciferol, (DRISDOL) 1.25 MG (50000 UNIT) CAPS capsule TAKE 1 CAPSULE BY MOUTH SAME DAY EACH WEEK   No facility-administered medications prior to visit.    Review of Systems  {Labs  Heme  Chem  Endocrine  Serology  Results Review (optional):23779}   Objective    There were no vitals taken for this visit. {Show previous Yvanna Vidas signs (optional):23777}  Physical Exam  ***  No results found for any visits on 03/26/23.  Assessment & Plan     ***  No  follow-ups on file.      {provider attestation***:1}   Alfredia Ferguson, PA-C  [facility] [phone] (phone) [phone] (fax)  [facility]

## 2023-03-26 ENCOUNTER — Encounter: Payer: Medicaid Other | Admitting: Physician Assistant

## 2023-03-29 NOTE — Progress Notes (Deleted)
     I,Dimitrious Micciche,acting as a Neurosurgeon for [employer], PA-C.,have documented all relevant documentation on the behalf of Alfredia Ferguson, PA-C,as directed by  Alfredia Ferguson, PA-C while in the presence of Alfredia Ferguson, PA-C.   Established patient visit   Patient: Martha Guerra   DOB: 1980/10/27   42 y.o. Female  MRN: [id number] Visit Date: 03/30/2023  Today's healthcare provider: Alfredia Ferguson, PA-C   No chief complaint on file.  Subjective    HPI  Patient is here today for a papsmear,  Medications: Outpatient Medications Prior to Visit  Medication Sig   alprazolam (XANAX) 2 MG tablet TAKE 1/2 TABLET BY MOUTH 4 TIMES DAILY MAX 2 TABLETS PER DAY   amphetamine-dextroamphetamine (ADDERALL) 10 MG tablet Take by mouth.   conjugated estrogens (PREMARIN) vaginal cream 1 gram intravaginal BIW   fluticasone (FLONASE) 50 MCG/ACT nasal spray Place 2 sprays into both nostrils daily.   lamoTRIgine (LAMICTAL) 100 MG tablet    lidocaine (XYLOCAINE) 2 % jelly 1 application  as needed.   lidocaine (XYLOCAINE) 5 % ointment Apply topically every 12 (twelve) hours as needed.   loratadine (CLARITIN) 10 MG tablet Take 1 tablet (10 mg total) by mouth daily.   oxyCODONE (ROXICODONE) 15 MG immediate release tablet Take 1 tablet (15 mg total) by mouth in the morning, at noon, in the evening, and at bedtime.   TRINTELLIX 20 MG TABS    venlafaxine XR (EFFEXOR-XR) 37.5 MG 24 hr capsule TAKE 1 CAPSULE BY MOUTH ONCE EVERY MORNING WITH BREAKFAST   Vitamin D, Ergocalciferol, (DRISDOL) 1.25 MG (50000 UNIT) CAPS capsule TAKE 1 CAPSULE BY MOUTH SAME DAY EACH WEEK   No facility-administered medications prior to visit.    Review of Systems  {Labs  Heme  Chem  Endocrine  Serology  Results Review (optional):23779}   Objective    There were no vitals taken for this visit. {Show previous Aayana Reinertsen signs (optional):23777}  Physical Exam  ***  No results found for any visits on 03/30/23.   Assessment & Plan     ***  No follow-ups on file.      {provider attestation***:1}   Alfredia Ferguson, PA-C  [facility] [phone] (phone) [phone] (fax)  [facility]

## 2023-03-30 ENCOUNTER — Ambulatory Visit: Payer: Medicaid Other | Admitting: Physician Assistant

## 2023-04-20 ENCOUNTER — Other Ambulatory Visit: Payer: Self-pay | Admitting: Family Medicine

## 2023-04-20 DIAGNOSIS — G8929 Other chronic pain: Secondary | ICD-10-CM

## 2023-04-21 ENCOUNTER — Ambulatory Visit: Payer: Self-pay

## 2023-04-21 ENCOUNTER — Telehealth: Payer: Self-pay | Admitting: Family Medicine

## 2023-04-21 ENCOUNTER — Telehealth (INDEPENDENT_AMBULATORY_CARE_PROVIDER_SITE_OTHER): Payer: Medicaid Other | Admitting: Family Medicine

## 2023-04-21 DIAGNOSIS — Z91199 Patient's noncompliance with other medical treatment and regimen due to unspecified reason: Secondary | ICD-10-CM

## 2023-04-21 MED ORDER — OXYCODONE HCL 15 MG PO TABS
15.0000 mg | ORAL_TABLET | Freq: Four times a day (QID) | ORAL | 0 refills | Status: DC
Start: 2023-04-21 — End: 2023-05-21

## 2023-04-21 MED ORDER — FLUCONAZOLE 150 MG PO TABS: ORAL_TABLET | ORAL | 0 refills | Status: DC

## 2023-04-21 NOTE — Progress Notes (Signed)
Patient was not seen for appt d/t no call, no show, or late arrival >10 mins past appt time.   Elise T Payne, FNP  [facility] [address] [place], [place] [address] [phone] (phone) [phone] (fax) [facility]  

## 2023-04-21 NOTE — Telephone Encounter (Signed)
[facility] is requesting prior authorization Key: [id number] Name: [place] oxyCODONE HCI 15 mg tablets

## 2023-04-21 NOTE — Telephone Encounter (Signed)
Chief Complaint: Vaginal discharge Symptoms: thick cottage cheese like discharge and itching Frequency: Onset yesterday  Pertinent Negatives: Patient denies other symptoms. Disposition: [] ED /[] Urgent Care (no appt availability in office) / [x] Appointment(In office/virtual)/ []  [place]/ [] Home Care/ [] Refused Recommended Disposition /[] [place] [facility]/ []  Follow-up with PCP Additional Notes: Patient reports having a head cold x4 days and took some of her son's left over antibiotics. Patient states when she takes these antibiotics she gets yeast infection symptoms. In the past over the counter treatment has not been effective. Patient stated that her PCP usually prescribes Diflucan and it is helpful. Advised patient that she would need to be evaluated and patient was agreeable. Due to transportation issues patient was scheduled for a virtual visit today.  Summary: vaginal drainage and itching   Vaginal drainage, itching ... 2 days.. patient stated she took her childs antibiotics for her head cold four days ago and may have an infection from that     Reason for Disposition  [1] Symptoms of a "yeast infection" (i.e., itchy, white discharge, not bad smelling) AND [2] not improved > 3 days following Care Advice  Answer Assessment - Initial Assessment Questions 1. DISCHARGE: "Describe the discharge." (e.g., white, yellow, green, gray, foamy, cottage cheese-like)     Cottage cheese-like thick discharge 2. ODOR: "Is there a bad odor?"     No 3. ONSET: "When did the discharge begin?"     Yesterday 4. RASH: "Is there a rash in the genital area?" If Yes, ask: "Describe it." (e.g., redness, blisters, sores, bumps)     No 5. ABDOMEN PAIN: "Are you having any abdomen pain?" If Yes, ask: "What does it feel like? " (e.g., crampy, dull, intermittent, constant)      No 6. ABDOMEN PAIN SEVERITY: If present, ask: "How bad is it?" (e.g., Scale 1-10; mild, moderate, or severe)   - MILD  (1-3): Doesn't interfere with normal activities, abdomen soft and not tender to touch.    - MODERATE (4-7): Interferes with normal activities or awakens from sleep, abdomen tender to touch.    - SEVERE (8-10): Excruciating pain, doubled over, unable to do any normal activities. (R/O peritonitis)      None 7. CAUSE: "What do you think is causing the discharge?" "Have you had the same problem before? What happened then?"     Took some antibiotics on Friday and notice discharge on Tuesday. I have this before in the past and was give diflucan for treatment. 8. OTHER SYMPTOMS: "Do you have any other symptoms?" (e.g., fever, itching, vaginal bleeding, pain with urination, injury to genital area, vaginal foreign body)     Vaginal Itching with discharge  Protocols used: Vaginal Discharge-A-AH

## 2023-05-03 NOTE — Telephone Encounter (Signed)
PA not done. Patient always pays out-of-pocket, per pharmacist.

## 2023-05-07 ENCOUNTER — Emergency Department
Admission: EM | Admit: 2023-05-07 | Discharge: 2023-05-07 | Disposition: A | Discharge status: Home / Self Care | Payer: Medicaid Other | Attending: Emergency Medicine | Admitting: Emergency Medicine

## 2023-05-07 ENCOUNTER — Other Ambulatory Visit: Payer: Self-pay

## 2023-05-07 DIAGNOSIS — N3000 Acute cystitis without hematuria: Secondary | ICD-10-CM | POA: Diagnosis not present

## 2023-05-07 DIAGNOSIS — R42 Dizziness and giddiness: Secondary | ICD-10-CM | POA: Diagnosis not present

## 2023-05-07 DIAGNOSIS — R5383 Other fatigue: Secondary | ICD-10-CM | POA: Diagnosis not present

## 2023-05-07 DIAGNOSIS — R300 Dysuria: Secondary | ICD-10-CM | POA: Diagnosis present

## 2023-05-07 LAB — COMPREHENSIVE METABOLIC PANEL
ALT: 15 U/L (ref 0–44)
AST: 19 U/L (ref 15–41)
Albumin: 3.9 g/dL (ref 3.5–5.0)
Alkaline Phosphatase: 97 U/L (ref 38–126)
Anion gap: 8 (ref 5–15)
BUN: 12 mg/dL (ref 6–20)
CO2: 25 mmol/L (ref 22–32)
Calcium: 8.7 mg/dL — ABNORMAL LOW (ref 8.9–10.3)
Chloride: 103 mmol/L (ref 98–111)
Creatinine, Ser: 0.63 mg/dL (ref 0.44–1.00)
GFR, Estimated: >60 mL/min (ref >60)
Glucose, Bld: 121 mg/dL — ABNORMAL HIGH (ref 70–99)
Potassium: 3.2 mmol/L — ABNORMAL LOW (ref 3.5–5.1)
Sodium: 136 mmol/L (ref 135–145)
Total Bilirubin: 0.4 mg/dL (ref 0.3–1.2)
Total Protein: 7.1 g/dL (ref 6.5–8.1)

## 2023-05-07 LAB — URINALYSIS, ROUTINE W REFLEX MICROSCOPIC
Bilirubin Urine: NEGATIVE
Glucose, UA: NEGATIVE mg/dL
Hgb urine dipstick: NEGATIVE
Ketones, ur: NEGATIVE mg/dL
Nitrite: NEGATIVE
Protein, ur: 30 mg/dL — AB
Specific Gravity, Urine: 1.021 (ref 1.005–1.030)
pH: 5 (ref 5.0–8.0)

## 2023-05-07 LAB — TSH: TSH: 0.957 u[IU]/mL (ref 0.350–4.500)

## 2023-05-07 LAB — CBC
HCT: 43.4 % (ref 36.0–46.0)
Hemoglobin: 14.8 g/dL (ref 12.0–15.0)
MCH: 29.6 pg (ref 26.0–34.0)
MCHC: 34.1 g/dL (ref 30.0–36.0)
MCV: 86.8 fL (ref 80.0–100.0)
Platelets: 360 10*3/uL (ref 150–400)
RBC: 5 MIL/uL (ref 3.87–5.11)
RDW: 14.8 % (ref 11.5–15.5)
WBC: 7 10*3/uL (ref 4.0–10.5)
nRBC: 0 % (ref 0.0–0.2)

## 2023-05-07 LAB — POC URINE PREG, ED: Preg Test, Ur: NEGATIVE

## 2023-05-07 LAB — TROPONIN I (HIGH SENSITIVITY): Troponin I (High Sensitivity): <2 ng/L (ref <18)

## 2023-05-07 MED ORDER — NITROFURANTOIN MONOHYD MACRO 100 MG PO CAPS: 100.0000 mg | ORAL_CAPSULE | Freq: Two times a day (BID) | ORAL | 0 refills | Status: AC

## 2023-05-07 MED ORDER — NITROFURANTOIN MONOHYD MACRO 100 MG PO CAPS
100.0000 mg | ORAL_CAPSULE | Freq: Once | ORAL | Status: AC
  Administered 2023-05-07: 100 mg via ORAL

## 2023-05-07 NOTE — ED Triage Notes (Addendum)
Pt to ED via POV c/o headache and dizziness. Pt reports having episodes of headache, dizziness, and fatigue on/off for about a year. Pt says she was told it was her prolactin level and was given medication for it whenever she would have these episodes. Denies CP, SOB, fevers. Pt reporting SI due to what's going on but doesn't endorse a plan

## 2023-05-07 NOTE — ED Provider Notes (Signed)
[facility] Provider Note    Event Date/Time   First MD Initiated Contact with Patient 05/07/23 0542     (approximate)   History   Headache and Dizziness   HPI  Martha Guerra is a 42 y.o. female   w/ extensive pmh pw cc one over one year of "feeling like my brain is paralyzed" and a few days of increased urination and dysuria.  No fevers.  No neck pain.  States that she feel tired all of the time.  Wanted to have her prolactin level and tsh checked.  Was supposed to have these followed in clinic but didn't show to appointments.  States she doesn't feel depressed or anxious. States over the past year has felt suicidal at times but has talked to her psychiatrist about this and does not have any SI currently or recently.      Physical Exam   Triage Vital Signs: ED Triage Vitals  Encounter Vitals Group     BP 05/07/23 0435 (!) 157/85     Systolic BP Percentile --      Diastolic BP Percentile --      Pulse Rate 05/07/23 0435 (!) 104     Resp 05/07/23 0435 18     Temp 05/07/23 0435 98.1 F (36.7 C)     Temp Source 05/07/23 0435 Oral     SpO2 05/07/23 0435 100 %     Weight --      Height 05/07/23 0436 5\' 5"  (1.651 m)     Head Circumference --      Peak Flow --      Pain Score 05/07/23 0435 4     Pain Loc --      Pain Education --      Exclude from Growth Chart --     Most recent vital signs: Vitals:   05/07/23 0435  BP: (!) 157/85  Pulse: (!) 104  Resp: 18  Temp: 98.1 F (36.7 C)  SpO2: 100%     Constitutional: Alert  Eyes: Conjunctivae are normal.  Head: Atraumatic. Nose: No congestion/rhinnorhea. Mouth/Throat: Mucous membranes are moist.   Neck: Painless ROM.  Cardiovascular:   Good peripheral circulation. Respiratory: Normal respiratory effort.  No retractions.  Gastrointestinal: Soft and nontender.  Musculoskeletal:  no deformity Neurologic:  MAE spontaneously. No gross focal neurologic deficits are appreciated.  Skin:   Skin is warm, dry and intact. No rash noted. Psychiatric: anxious appearing,     ED Results / Procedures / Treatments   Labs (all labs ordered are listed, but only abnormal results are displayed) Labs Reviewed  COMPREHENSIVE METABOLIC PANEL - Abnormal; Notable for the following components:      Result Value   Potassium 3.2 (*)    Glucose, Bld 121 (*)    Calcium 8.7 (*)    All other components within normal limits  URINALYSIS, ROUTINE W REFLEX MICROSCOPIC - Abnormal; Notable for the following components:   Color, Urine AMBER (*)    APPearance CLOUDY (*)    Protein, ur 30 (*)    Leukocytes,Ua SMALL (*)    Bacteria, UA RARE (*)    All other components within normal limits  CBC  PROLACTIN  TSH  POC URINE PREG, ED  TROPONIN I (HIGH SENSITIVITY)  TROPONIN I (HIGH SENSITIVITY)     EKG  ED ECG REPORT I, Willy Eddy, the attending physician, personally viewed and interpreted this ECG.   Date: 05/07/2023  EKG Time: 4:39  Rate: 95  Rhythm: sinus  Axis: normal  Intervals: normal  ST&T Change: no stemi, no depressions     PROCEDURES:  Critical Care performed: No  Procedures   MEDICATIONS ORDERED IN ED: Medications  nitrofurantoin (macrocrystal-monohydrate) (MACROBID) capsule 100 mg (has no administration in time range)     IMPRESSION / MDM / ASSESSMENT AND PLAN / ED COURSE  I reviewed the triage vital signs and the nursing notes.                              Differential diagnosis includes, but is not limited to, anxiety, depression, electrolyte abn, mass, dehydration, uti,   Patient presenting to the ER for evaluation of symptoms as described above.  Based on symptoms, risk factors and considered above differential, this presenting complaint could reflect a potentially life-threatening illness therefore the patient will be placed on continuous pulse oximetry and telemetry for monitoring.  Laboratory evaluation will be sent to evaluate for the above  complaints.  Blood work is reassuring.  Exam without focal deficits.  Sx has been ongoing for more than a year.  Diagnostic imaging not indicated given this presentation given reassuring exam and duration of symptoms.  Will treat for UTI.  Does appear stable and appropriate for outpatient follow up.       FINAL CLINICAL IMPRESSION(S) / ED DIAGNOSES   Final diagnoses:  Other fatigue  Acute cystitis without hematuria     Rx / DC Orders   ED Discharge Orders          Ordered    nitrofurantoin, macrocrystal-monohydrate, (MACROBID) 100 MG capsule  2 times daily        05/07/23 0607             Note:  This document was prepared using Dragon voice recognition software and may include unintentional dictation errors.    Willy Eddy, MD 05/07/23 [phone]

## 2023-05-08 LAB — PROLACTIN: Prolactin: 16.2 ng/mL (ref 4.8–33.4)

## 2023-05-11 ENCOUNTER — Other Ambulatory Visit: Payer: Self-pay | Admitting: Family Medicine

## 2023-05-11 DIAGNOSIS — F411 Generalized anxiety disorder: Secondary | ICD-10-CM

## 2023-05-21 ENCOUNTER — Telehealth: Payer: Self-pay | Admitting: Family Medicine

## 2023-05-21 ENCOUNTER — Other Ambulatory Visit: Payer: Self-pay | Admitting: Family Medicine

## 2023-05-21 ENCOUNTER — Ambulatory Visit: Payer: Self-pay

## 2023-05-21 DIAGNOSIS — G8929 Other chronic pain: Secondary | ICD-10-CM

## 2023-05-21 MED ORDER — FLUCONAZOLE 150 MG PO TABS: ORAL_TABLET | ORAL | 0 refills | Status: DC

## 2023-05-21 MED ORDER — OXYCODONE HCL 15 MG PO TABS
15.0000 mg | ORAL_TABLET | Freq: Four times a day (QID) | ORAL | 0 refills | Status: DC
Start: 2023-05-21 — End: 2023-06-21

## 2023-05-21 NOTE — Telephone Encounter (Signed)
Medication Refill - Medication: oxyCODONE (ROXICODONE) 15 MG immediate release tablet   Has the patient contacted their pharmacy? No.  Preferred Pharmacy (with phone number or street name):  [facility] [place], [place] - [address] Phone: [phone]  Fax: [phone]     Has the patient been seen for an appointment in the last year OR does the patient have an upcoming appointment? Yes.    Agent: Please be advised that RX refills may take up to 3 business days. We ask that you follow-up with your pharmacy.

## 2023-05-21 NOTE — Addendum Note (Signed)
Addended by: Malva Limes on: 05/21/2023 12:26 PM   Modules accepted: Orders

## 2023-05-21 NOTE — Telephone Encounter (Signed)
Requested medication (s) are due for refill today - yes  Requested medication (s) are on the active medication list -yes  Future visit scheduled -yes  Last refill: 04/21/23 #120  Notes to clinic: Duplicate request- non delegated Rx  Requested Prescriptions  Pending Prescriptions Disp Refills   oxyCODONE (ROXICODONE) 15 MG immediate release tablet [Pharmacy Med Name: OXYCODONE HCL 15 MG TAB] 120 tablet     Sig: TAKE 1 TABLET BY MOUTH ONCE EVERY MORNING AT NOON AND ONCE EVERY EVENING AND AT BEDTIME     Not Delegated - Analgesics:  Opioid Agonists Failed - 05/21/2023 10:37 AM      Failed - This refill cannot be delegated      Failed - Urine Drug Screen completed in last 360 days      Passed - Valid encounter within last 3 months    Recent Outpatient Visits           1 month ago No-show for appointment   [facility] Jacky Kindle, FNP   4 months ago Vitamin D deficiency   [facility] Malva Limes, MD   7 months ago Bronchitis   [facility] Malva Limes, MD   8 months ago No-show for appointment   [facility] Malva Limes, MD   1 year ago Dizziness   [facility] Malva Limes, MD       Future Appointments             In 4 days Jacky Kindle, FNP  [facility], [facility]   In 3 weeks Sherrie Mustache, Demetrios Isaacs, MD [facility], [facility]               Requested Prescriptions  Pending Prescriptions Disp Refills   oxyCODONE (ROXICODONE) 15 MG immediate release tablet [Pharmacy Med Name: OXYCODONE HCL 15 MG TAB] 120 tablet     Sig: TAKE 1 TABLET BY MOUTH ONCE EVERY MORNING AT NOON AND ONCE EVERY EVENING AND AT BEDTIME     Not Delegated - Analgesics:  Opioid Agonists Failed - 05/21/2023 10:37 AM      Failed - This refill cannot be delegated      Failed - Urine Drug Screen completed in last 360  days      Passed - Valid encounter within last 3 months    Recent Outpatient Visits           1 month ago No-show for appointment   [facility] Jacky Kindle, FNP   4 months ago Vitamin D deficiency   [facility] Malva Limes, MD   7 months ago Bronchitis   [facility] Malva Limes, MD   8 months ago No-show for appointment   [facility] Malva Limes, MD   1 year ago Dizziness   [facility] Malva Limes, MD       Future Appointments             In 4 days Jacky Kindle, FNP [facility], [facility]   In 3 weeks Sherrie Mustache, Demetrios Isaacs, MD [facility], [facility]

## 2023-05-21 NOTE — Telephone Encounter (Signed)
Requested medication (s) are due for refill today - yes  Requested medication (s) are on the active medication list -yes  Future visit scheduled -yes  Last refill: 04/21/23 #120  Notes to clinic: non delegated Rx  Requested Prescriptions  Pending Prescriptions Disp Refills   oxyCODONE (ROXICODONE) 15 MG immediate release tablet 120 tablet 0    Sig: Take 1 tablet (15 mg total) by mouth in the morning, at noon, in the evening, and at bedtime.     Not Delegated - Analgesics:  Opioid Agonists Failed - 05/21/2023  9:43 AM      Failed - This refill cannot be delegated      Failed - Urine Drug Screen completed in last 360 days      Passed - Valid encounter within last 3 months    Recent Outpatient Visits           1 month ago No-show for appointment   [facility] Jacky Kindle, FNP   4 months ago Vitamin D deficiency   [facility] Malva Limes, MD   7 months ago Bronchitis   [facility] Malva Limes, MD   8 months ago No-show for appointment   [facility] Malva Limes, MD   1 year ago Dizziness   [facility] Malva Limes, MD       Future Appointments             In 4 days Jacky Kindle, FNP [facility], [facility]   In 3 weeks Sherrie Mustache, Demetrios Isaacs, MD [facility], [facility]               Requested Prescriptions  Pending Prescriptions Disp Refills   oxyCODONE (ROXICODONE) 15 MG immediate release tablet 120 tablet 0    Sig: Take 1 tablet (15 mg total) by mouth in the morning, at noon, in the evening, and at bedtime.     Not Delegated - Analgesics:  Opioid Agonists Failed - 05/21/2023  9:43 AM      Failed - This refill cannot be delegated      Failed - Urine Drug Screen completed in last 360 days      Passed - Valid encounter within last 3 months    Recent Outpatient Visits            1 month ago No-show for appointment   [facility] Jacky Kindle, FNP   4 months ago Vitamin D deficiency   [facility] Malva Limes, MD   7 months ago Bronchitis   [facility] Malva Limes, MD   8 months ago No-show for appointment   [facility] Malva Limes, MD   1 year ago Dizziness   [facility] Malva Limes, MD       Future Appointments             In 4 days Jacky Kindle, FNP [facility], [facility]   In 3 weeks Sherrie Mustache, Demetrios Isaacs, MD [facility], [facility]

## 2023-05-21 NOTE — Telephone Encounter (Signed)
[facility] is requesting prescription refill Key: [id number] Name: [place] oxyCODONE HCI 15MG  tablets

## 2023-05-21 NOTE — Telephone Encounter (Signed)
Chief Complaint: Vaginal symptoms after antibiotic treatment Symptoms: Vaginal itching  Frequency: constant 2-3 days  Pertinent Negatives: Patient denies discharge or burning Disposition: [] ED /[] Urgent Care (no appt availability in office) / [x] Appointment(In office/virtual)/ []  [facility]/ [] Home Care/ [] Refused Recommended Disposition /[] [place] [facility]/ []  Follow-up with PCP Additional Notes: Patient states she is experiencing vaginal itching after being treated for a UTI with Macrobid 100mg  2 times daily. Patient stated that she is unable to do a MyChart video visit due to camera issues. Patient also stated she is due for a general follow up with PCP. Patient has been scheduled on 05/25/23 at 1420 and a follow up with PCP on 06/11/23. Patient also requested a refill on her pain medication.   Summary: medication for yeast infection   Patient called sttd she went to the ER and was prescribed an antibiotic and she now has an yeast infection. Patient now needs something called in for the yeast infection. Please f/u with patient     Reason for Disposition  [1] Finished taking antibiotics AND [2] symptoms are BETTER but [3] not completely gone  Answer Assessment - Initial Assessment Questions 1. INFECTION: "What infection is the antibiotic being given for?"     UTI 2. ANTIBIOTIC: "What antibiotic are you taking" "How many times per day?"     Macrobid 100mg  2 times daily  3. DURATION: "When was the antibiotic started?"     05/07/23 4. MAIN CONCERN OR SYMPTOM:  "What is your main concern right now?"     Itching 5. BETTER-SAME-WORSE: "Are you getting better, staying the same, or getting worse compared to when you first started the antibiotics?" If getting worse, ask: "In what way?"      Same  6. FEVER: "Do you have a fever?" If Yes, ask: "What is your temperature, how was it measured, and when did it start?"     No 7. SYMPTOMS: "Are there any other symptoms you're concerned  about?" If Yes, ask: "When did it start?"     Vaginal itching onset 2 days ago 8. FOLLOW-UP APPOINTMENT: "Do you have a follow-up appointment with your doctor?"     I don't right now  Protocols used: Infection on Antibiotic Follow-up Call-A-AH

## 2023-05-25 ENCOUNTER — Ambulatory Visit (INDEPENDENT_AMBULATORY_CARE_PROVIDER_SITE_OTHER): Payer: Medicaid Other | Admitting: Family Medicine

## 2023-05-25 DIAGNOSIS — F4310 Post-traumatic stress disorder, unspecified: Secondary | ICD-10-CM | POA: Diagnosis not present

## 2023-05-25 DIAGNOSIS — Z91199 Patient's noncompliance with other medical treatment and regimen due to unspecified reason: Secondary | ICD-10-CM

## 2023-05-25 NOTE — Progress Notes (Signed)
Patient was not seen for appt d/t no call, no show, or late arrival >10 mins past appt time.   Desi Carby T Charlita Brian, FNP  [facility] [address] [place], [place] [address] [phone] (phone) [phone] (fax) [facility]  

## 2023-06-11 ENCOUNTER — Ambulatory Visit: Payer: Medicaid Other | Admitting: Family Medicine

## 2023-06-21 ENCOUNTER — Other Ambulatory Visit: Payer: Self-pay | Admitting: Family Medicine

## 2023-06-21 DIAGNOSIS — M5442 Lumbago with sciatica, left side: Secondary | ICD-10-CM

## 2023-06-21 MED ORDER — OXYCODONE HCL 15 MG PO TABS
15.0000 mg | ORAL_TABLET | Freq: Four times a day (QID) | ORAL | 0 refills | Status: AC
Start: 2023-06-21 — End: ?

## 2023-07-21 ENCOUNTER — Other Ambulatory Visit: Payer: Self-pay | Admitting: Family Medicine

## 2023-07-21 DIAGNOSIS — G8929 Other chronic pain: Secondary | ICD-10-CM

## 2023-07-21 MED ORDER — OXYCODONE HCL 15 MG PO TABS
15.0000 mg | ORAL_TABLET | Freq: Four times a day (QID) | ORAL | 0 refills | Status: DC
Start: 2023-07-21 — End: 2023-08-20

## 2023-08-10 ENCOUNTER — Telehealth: Payer: Self-pay | Admitting: Family Medicine

## 2023-08-10 DIAGNOSIS — L905 Scar conditions and fibrosis of skin: Secondary | ICD-10-CM

## 2023-08-10 NOTE — Telephone Encounter (Signed)
Patient stopped by asking for another referral for her to see Dr. Cheree Ditto at [facility] regarding the scars on her face from when she cut herself years back.  Her referral has expired.

## 2023-08-12 NOTE — Addendum Note (Signed)
Addended by: Mila Merry E on: 08/12/2023 12:56 PM   Modules accepted: Orders

## 2023-08-16 DIAGNOSIS — H5213 Myopia, bilateral: Secondary | ICD-10-CM | POA: Diagnosis not present

## 2023-08-20 ENCOUNTER — Other Ambulatory Visit: Payer: Self-pay | Admitting: Family Medicine

## 2023-08-20 DIAGNOSIS — G8929 Other chronic pain: Secondary | ICD-10-CM

## 2023-08-22 MED ORDER — OXYCODONE HCL 15 MG PO TABS
15.0000 mg | ORAL_TABLET | Freq: Four times a day (QID) | ORAL | 0 refills | Status: DC
Start: 2023-08-22 — End: 2023-08-30

## 2023-08-23 ENCOUNTER — Telehealth: Payer: Self-pay | Admitting: Family Medicine

## 2023-08-23 NOTE — Telephone Encounter (Signed)
Received a fax from covermymeds for Oxycodone HCI 15mg   Key: [id number]

## 2023-08-26 NOTE — Telephone Encounter (Signed)
PA sent to plan

## 2023-08-30 ENCOUNTER — Encounter: Payer: Self-pay | Admitting: Family Medicine

## 2023-08-30 ENCOUNTER — Ambulatory Visit (INDEPENDENT_AMBULATORY_CARE_PROVIDER_SITE_OTHER): Payer: Medicaid Other | Admitting: Family Medicine

## 2023-08-30 VITALS — BP 120/83 | HR 105 | Ht 64.0 in | Wt 172.0 lb

## 2023-08-30 DIAGNOSIS — M5442 Lumbago with sciatica, left side: Secondary | ICD-10-CM

## 2023-08-30 DIAGNOSIS — M5126 Other intervertebral disc displacement, lumbar region: Secondary | ICD-10-CM

## 2023-08-30 DIAGNOSIS — E559 Vitamin D deficiency, unspecified: Secondary | ICD-10-CM

## 2023-08-30 DIAGNOSIS — F909 Attention-deficit hyperactivity disorder, unspecified type: Secondary | ICD-10-CM

## 2023-08-30 DIAGNOSIS — K219 Gastro-esophageal reflux disease without esophagitis: Secondary | ICD-10-CM

## 2023-08-30 DIAGNOSIS — M5441 Lumbago with sciatica, right side: Secondary | ICD-10-CM

## 2023-08-30 DIAGNOSIS — G8929 Other chronic pain: Secondary | ICD-10-CM | POA: Diagnosis not present

## 2023-08-30 MED ORDER — VITAMIN D (ERGOCALCIFEROL) 1.25 MG (50000 UNIT) PO CAPS: 50000.0000 [IU] | ORAL_CAPSULE | ORAL | 3 refills | Status: DC

## 2023-08-30 MED ORDER — OXYCODONE HCL 15 MG PO TABS
15.0000 mg | ORAL_TABLET | Freq: Four times a day (QID) | ORAL | 0 refills | Status: DC
Start: 2023-08-30 — End: 2023-09-28

## 2023-08-30 MED ORDER — FLUCONAZOLE 150 MG PO TABS: 150.0000 mg | ORAL_TABLET | Freq: Once | ORAL | 0 refills | Status: AC
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
## 2023-08-30 NOTE — Patient Instructions (Signed)
.   Please review the attached list of medications and notify my office if there are any errors.   . Please bring all of your medications to every appointment so we can make sure that our medication list is the same as yours.   

## 2023-09-14 DIAGNOSIS — L910 Hypertrophic scar: Secondary | ICD-10-CM | POA: Diagnosis not present

## 2023-09-28 ENCOUNTER — Other Ambulatory Visit: Payer: Self-pay | Admitting: Family Medicine

## 2023-09-28 DIAGNOSIS — G8929 Other chronic pain: Secondary | ICD-10-CM

## 2023-09-29 ENCOUNTER — Telehealth: Payer: Self-pay | Admitting: Family Medicine

## 2023-09-29 MED ORDER — OXYCODONE HCL 15 MG PO TABS
15.0000 mg | ORAL_TABLET | Freq: Four times a day (QID) | ORAL | 0 refills | Status: DC
Start: 2023-09-29 — End: 2023-10-28

## 2023-09-29 NOTE — Telephone Encounter (Signed)
Received a fax from covermymeds for Oxycodone HCI 15mg   Key: [id number]

## 2023-10-01 NOTE — Telephone Encounter (Signed)
Signed     Received a fax from covermymeds for Oxycodone HCI 15mg  Tablets (is waiting )   Key: [id number]

## 2023-10-06 NOTE — Telephone Encounter (Signed)
Pa started and notes faxed

## 2023-10-11 ENCOUNTER — Other Ambulatory Visit: Payer: Self-pay | Admitting: Family Medicine

## 2023-10-11 DIAGNOSIS — F411 Generalized anxiety disorder: Secondary | ICD-10-CM

## 2023-10-12 NOTE — Telephone Encounter (Signed)
Requested medication (s) are due for refill today - yes  Requested medication (s) are on the active medication list -yes  Future visit scheduled -yes  Last refill: 05/12/23 #60 3RF  Notes to clinic: non delegated Rx  Requested Prescriptions  Pending Prescriptions Disp Refills   alprazolam (XANAX) 2 MG tablet [Pharmacy Med Name: ALPRAZOLAM 2 MG TAB] 60 tablet     Sig: TAKE 1/2 TABLET BY MOUTH 4 TIMES DAILY MAX 2 TABLETS PER DAY     Not Delegated - Psychiatry: Anxiolytics/Hypnotics 2 Failed - 10/12/2023  8:44 AM      Failed - This refill cannot be delegated      Failed - Urine Drug Screen completed in last 360 days      Passed - Patient is not pregnant      Passed - Valid encounter within last 6 months    Recent Outpatient Visits           1 month ago Vitamin D deficiency   [facility] Malva Limes, MD   4 months ago No-show for appointment   [facility] Merita Norton T, FNP   5 months ago No-show for appointment   [facility] Jacky Kindle, FNP   9 months ago Vitamin D deficiency   [facility] Malva Limes, MD   1 year ago Bronchitis   [facility] Malva Limes, MD       Future Appointments             In 2 months Fisher, Demetrios Isaacs, MD [facility], [facility]               Requested Prescriptions  Pending Prescriptions Disp Refills   alprazolam Prudy Feeler) 2 MG tablet [Pharmacy Med Name: ALPRAZOLAM 2 MG TAB] 60 tablet     Sig: TAKE 1/2 TABLET BY MOUTH 4 TIMES DAILY MAX 2 TABLETS PER DAY     Not Delegated - Psychiatry: Anxiolytics/Hypnotics 2 Failed - 10/12/2023  8:44 AM      Failed - This refill cannot be delegated      Failed - Urine Drug Screen completed in last 360 days      Passed - Patient is not pregnant      Passed - Valid encounter within last 6 months    Recent Outpatient Visits            1 month ago Vitamin D deficiency   [facility] Malva Limes, MD   4 months ago No-show for appointment   [facility] Merita Norton T, FNP   5 months ago No-show for appointment   [facility] Jacky Kindle, FNP   9 months ago Vitamin D deficiency   [facility] Malva Limes, MD   1 year ago Bronchitis   [facility] Malva Limes, MD       Future Appointments             In 2 months Fisher, Demetrios Isaacs, MD [facility], [facility]

## 2023-10-18 ENCOUNTER — Telehealth: Payer: Medicaid Other | Admitting: Family Medicine

## 2023-10-18 DIAGNOSIS — J329 Chronic sinusitis, unspecified: Secondary | ICD-10-CM

## 2023-10-18 MED ORDER — AZITHROMYCIN 250 MG PO TABS: ORAL_TABLET | ORAL | 0 refills | Status: AC

## 2023-10-18 NOTE — Progress Notes (Signed)
      Established patient visit   Patient: Martha Guerra   DOB: 1981/09/25   42 y.o. Female  MRN: [id number] Visit Date: 10/18/2023  Today's healthcare provider: Mila Merry, MD   No chief complaint on file.  Subjective    Discussed the use of AI scribe software for clinical note transcription with the patient, who gave verbal consent to proceed.  History of Present Illness   The patient has been experiencing recurrent symptoms suggestive of a sinus infection for approximately four to five days. She reports sensitivity and pain around the sides of the nose, between the eyebrows, and around the eyes. The patient describes the pain as severe and the areas as swollen and inflamed. Accompanying these symptoms is a hoarseness of voice and a productive cough, which she attributes to postnasal drainage. The patient has a history of similar episodes, which have previously responded well to treatment with Azithromycin (Z-Pak). She expresses concern about the potential for the infection to progress to pneumonia if not treated.      Medications: Outpatient Medications Prior to Visit  Medication Sig   alprazolam (XANAX) 2 MG tablet TAKE 1/2 TABLET BY MOUTH 4 TIMES DAILY MAX 2 TABLETS PER DAY   amphetamine-dextroamphetamine (ADDERALL) 10 MG tablet Take by mouth.   conjugated estrogens (PREMARIN) vaginal cream 1 gram intravaginal BIW   escitalopram (LEXAPRO) 5 MG tablet Take 5 mg by mouth daily.   fluticasone (FLONASE) 50 MCG/ACT nasal spray Place 2 sprays into both nostrils daily.   lamoTRIgine (LAMICTAL) 100 MG tablet    lidocaine (XYLOCAINE) 2 % jelly 1 application  as needed.   lidocaine (XYLOCAINE) 5 % ointment Apply topically every 12 (twelve) hours as needed.   loratadine (CLARITIN) 10 MG tablet Take 1 tablet (10 mg total) by mouth daily.   oxyCODONE (ROXICODONE) 15 MG immediate release tablet Take 1 tablet (15 mg total) by mouth in the morning, at noon, in the evening, and at bedtime.    Vitamin D, Ergocalciferol, (DRISDOL) 1.25 MG (50000 UNIT) CAPS capsule Take 1 capsule (50,000 Units total) by mouth every 7 (seven) days.   No facility-administered medications prior to visit.   Review of Systems     Objective    There were no vitals taken for this visit.  Physical Exam  Awake, alert, oriented x 3. In no apparent distress   Assessment & Plan        Sinusitis Symptoms of sinus pressure, postnasal drip, and productive cough for 4-5 days. History of similar symptoms resolving with azithromycin (Z-Pak). -Prescribe Z-Pak, to be picked up at [facility].         Mila Merry, MD  [facility] [phone] (phone) [phone] (fax)  [facility]

## 2023-10-28 ENCOUNTER — Other Ambulatory Visit: Payer: Self-pay | Admitting: Family Medicine

## 2023-10-28 DIAGNOSIS — G8929 Other chronic pain: Secondary | ICD-10-CM

## 2023-10-29 MED ORDER — OXYCODONE HCL 15 MG PO TABS
15.0000 mg | ORAL_TABLET | Freq: Four times a day (QID) | ORAL | 0 refills | Status: DC
Start: 2023-10-29 — End: 2023-11-28

## 2023-11-28 ENCOUNTER — Other Ambulatory Visit: Payer: Self-pay | Admitting: Family Medicine

## 2023-11-28 DIAGNOSIS — G8929 Other chronic pain: Secondary | ICD-10-CM

## 2023-11-29 MED ORDER — OXYCODONE HCL 15 MG PO TABS
15.0000 mg | ORAL_TABLET | Freq: Four times a day (QID) | ORAL | 0 refills | Status: DC
Start: 2023-11-29 — End: 2023-12-06

## 2023-12-06 ENCOUNTER — Telehealth: Payer: Self-pay | Admitting: Family Medicine

## 2023-12-06 DIAGNOSIS — G8929 Other chronic pain: Secondary | ICD-10-CM

## 2023-12-07 ENCOUNTER — Telehealth: Payer: Self-pay | Admitting: Family Medicine

## 2023-12-07 NOTE — Telephone Encounter (Signed)
 Pt is calling in wanting to know if her medication oxyCODONE (ROXICODONE) 15 MG [facility] [place]. Pt says the pharmacy she usually gets them from is currently out of stock and she is out of medication

## 2023-12-07 NOTE — Telephone Encounter (Signed)
 Please contact pharmacy about this. Pt states she was dispensed 16 oxycodone tablets on February 10th, but PDMP says she was dispensed 120. Please verify with pharmacy.

## 2023-12-08 ENCOUNTER — Other Ambulatory Visit: Payer: Self-pay | Admitting: Family Medicine

## 2023-12-08 DIAGNOSIS — G8929 Other chronic pain: Secondary | ICD-10-CM

## 2023-12-08 MED ORDER — OXYCODONE HCL 15 MG PO TABS
15.0000 mg | ORAL_TABLET | Freq: Four times a day (QID) | ORAL | 0 refills | Status: DC
Start: 2023-12-08 — End: 2024-01-05

## 2023-12-08 NOTE — Telephone Encounter (Signed)
 PDMP states she was dispensed 30 day supply from [facility] on 2/10. May need to call pharmacy to verify

## 2023-12-08 NOTE — Telephone Encounter (Signed)
 Copied from CRM [id number]. Topic: Clinical - Medication Refill >> Dec 08, 2023 11:59 AM Patsy Lager T wrote: Most Recent Primary Care Visit:  Provider: Malva Limes  Department: [id number]  Visit Type: MYCHART VIDEO VISIT  Date: 10/18/2023  Medication: oxyCODONE (ROXICODONE) 15 MG immediate release tablet  Has the patient contacted their pharmacy? Yes Patient said she called the pharmacy and they have the quantity that she needs  Is this the correct pharmacy for this prescription? Yes  This is the patient's preferred pharmacy:   [id number] [place] [place] - [address] [place] [address] Phone: [phone] Fax: [phone]  Has the prescription been filled recently? Yes  Is the patient out of the medication? Yes  Has the patient been seen for an appointment in the last year OR does the patient have an upcoming appointment? Yes  Can we respond through MyChart? Yes  Agent: Please be advised that Rx refills may take up to 3 business days. We ask that you follow-up with your pharmacy.

## 2023-12-08 NOTE — Telephone Encounter (Signed)
 Called [facility] pharmacy per pharmacist 15 mg is back order. They did a partial prescription. They do not know when they will receive the 15 mg.  Pharmacist said that we can send in a new prescription of 10 mg or 30 mg.

## 2023-12-08 NOTE — Telephone Encounter (Signed)
 Per message in other telephone encounter received 2/19, [facility] [place]

## 2023-12-09 NOTE — Telephone Encounter (Signed)
 Requested medication (s) are due for refill today: No  Requested medication (s) are on the active medication list: Yes  Last refill:  12/08/23  Future visit scheduled: Yes  Notes to clinic:  Not delegated, filled 12/08/23.    Requested Prescriptions  Pending Prescriptions Disp Refills   oxyCODONE (ROXICODONE) 15 MG immediate release tablet 120 tablet 0    Sig: Take 1 tablet (15 mg total) by mouth in the morning, at noon, in the evening, and at bedtime.     Not Delegated - Analgesics:  Opioid Agonists Failed - 12/09/2023  8:52 AM      Failed - This refill cannot be delegated      Failed - Urine Drug Screen completed in last 360 days      Passed - Valid encounter within last 3 months    Recent Outpatient Visits           1 month ago Sinusitis, unspecified chronicity, unspecified location   [facility] Malva Limes, MD   3 months ago Vitamin D deficiency   [facility] Malva Limes, MD   6 months ago No-show for appointment   [facility] Merita Norton T, FNP   7 months ago No-show for appointment   [facility] Jacky Kindle, FNP   11 months ago Vitamin D deficiency   [facility] Malva Limes, MD       Future Appointments             In 3 weeks Fisher, Demetrios Isaacs, MD [facility]

## 2023-12-31 ENCOUNTER — Ambulatory Visit: Admitting: Internal Medicine

## 2023-12-31 ENCOUNTER — Ambulatory Visit: Payer: Self-pay | Admitting: Family Medicine

## 2023-12-31 ENCOUNTER — Encounter: Payer: Self-pay | Admitting: Internal Medicine

## 2023-12-31 ENCOUNTER — Other Ambulatory Visit: Payer: Self-pay | Admitting: Family Medicine

## 2023-12-31 VITALS — BP 122/82 | HR 114 | Ht 64.0 in | Wt 151.2 lb

## 2023-12-31 DIAGNOSIS — R0602 Shortness of breath: Secondary | ICD-10-CM | POA: Diagnosis not present

## 2023-12-31 DIAGNOSIS — R0989 Other specified symptoms and signs involving the circulatory and respiratory systems: Secondary | ICD-10-CM

## 2023-12-31 DIAGNOSIS — R053 Chronic cough: Secondary | ICD-10-CM | POA: Diagnosis not present

## 2023-12-31 DIAGNOSIS — Z77120 Contact with and (suspected) exposure to mold (toxic): Secondary | ICD-10-CM

## 2023-12-31 DIAGNOSIS — F411 Generalized anxiety disorder: Secondary | ICD-10-CM

## 2023-12-31 DIAGNOSIS — G8929 Other chronic pain: Secondary | ICD-10-CM

## 2023-12-31 NOTE — Telephone Encounter (Signed)
 Chief Complaint: SOB Symptoms: anxiety, runny nose, cough, headaches, post nasal drip Frequency: x 2-3 months, worsening and more frequent past couple of weeks Pertinent Negatives: Patient denies N/A. Disposition: [] ED /[] Urgent Care (no appt availability in office) / [x] Appointment(In office/virtual)/ []  [place]/ [] Home Care/ [] Refused Recommended Disposition /[] [place] [facility]/ []  Follow-up with PCP Additional Notes: Patient speaking in full sentences, able to say multiple sentences before taking a breath. No gasping, wheezing or labored breathing audible during triage. Patient states her runny nose and post nasal drip has been ongoing x 1 year. She states she went to the ED and was told it was anxiety. Patient states she was told by family she either has a brain tumor or Huntington's disease, she states she Googled it and she thinks she has a mold infection. She states she was exposed to mold at work. Patient states her symptoms have worsened and become more frequent over the past couple of weeks. She is on the phone holding, waiting to speak with someone and states she can not make it to her office visit with her PCP. Disposition recommends to be seen within 4 hours. No available appts with PCP or at PCP clinic. Patient agreeable to be seen at [facility] later this afternoon.  Copied from CRM [id number]. Topic: Clinical - Red Word Triage >> Dec 31, 2023 12:36 PM Elle L wrote: Red Word that prompted transfer to Nurse Triage: The patient states that she is on an important phone call and is on hold and she is unsure if she can make it to her appointment today. However, she informed me that she has been having difficulty breathing and issues with her windpipe. Reason for Disposition  [1] MILD difficulty breathing (e.g., minimal/no SOB at rest, SOB with walking, pulse <100) AND [2] NEW-onset or WORSE than normal  Answer Assessment - Initial Assessment Questions 1. RESPIRATORY STATUS:  "Describe your breathing?" (e.g., wheezing, shortness of breath, unable to speak, severe coughing)      "My wind pipe will cut off", she states she was walking back to her apartment and her throat completely closed and she states she fell down to the ground but was able to still breath. She states she felt like she was going to die, she had to lie on the floor and focus on her breathing and resolved on its own. She states that happened a couple of months ago.  2. ONSET: "When did this breathing problem begin?"      2-3 months ago.  3. PATTERN "Does the difficult breathing come and go, or has it been constant since it started?"      Comes and goes.  4. SEVERITY: "How bad is your breathing?" (e.g., mild, moderate, severe)    - MILD: No SOB at rest, mild SOB with walking, speaks normally in sentences, can lie down, no retractions, pulse < 100.    - MODERATE: SOB at rest, SOB with minimal exertion and prefers to sit, cannot lie down flat, speaks in phrases, mild retractions, audible wheezing, pulse 100-120.    - SEVERE: Very SOB at rest, speaks in single words, struggling to breathe, sitting hunched forward, retractions, pulse > 120      Patient states she feels like she has to shallow breath and she can not explain it. She feels like her throat starts closing up, coughing and runny nose.  5. RECURRENT SYMPTOM: "Have you had difficulty breathing before?" If Yes, ask: "When was the last time?" and "What  happened that time?"      Denies.  6. CARDIAC HISTORY: "Do you have any history of heart disease?" (e.g., heart attack, angina, bypass surgery, angioplasty)      Denies.  7. LUNG HISTORY: "Do you have any history of lung disease?"  (e.g., pulmonary embolus, asthma, emphysema)     Denies.  8. CAUSE: "What do you think is causing the breathing problem?"      She thinks it is a mold infection, she states she was exposed to mold at work. She is also unsure if it is related to allergies.  9. OTHER  SYMPTOMS: "Do you have any other symptoms? (e.g., dizziness, runny nose, cough, chest pain, fever)     Difficulty swallowing, runny nose, cough, post nasal drip, headaches.  10. O2 SATURATION MONITOR:  "Do you use an oxygen saturation monitor (pulse oximeter) at home?" If Yes, ask: "What is your reading (oxygen level) today?" "What is your usual oxygen saturation reading?" (e.g., 95%)       Patient does not have one.  11. PREGNANCY: "Is there any chance you are pregnant?" "When was your last menstrual period?"       LMP end of last month.  12. TRAVEL: "Have you traveled out of the country in the last month?" (e.g., travel history, exposures)       Denies.  Protocols used: Breathing Difficulty-A-AH

## 2023-12-31 NOTE — Progress Notes (Signed)
**Note Martha-Identified via Obfuscation**  Subjective:    Patient ID: Martha Guerra, female    DOB: 15-Jun-1981, 43 y.o.   MRN: [id number]  HPI  Discussed the use of AI scribe software for clinical note transcription with the patient, who gave verbal consent to proceed.   Martha Burrs Kahan "[name]" is a 43 year old female who presents with chronic cough and sinus symptoms.  She has been experiencing a chronic cough and sinus symptoms for several months. The cough is sometimes dry and sometimes productive, accompanied by clear nasal discharge. She feels as though her throat is 'closing up constantly' and experiences both cough and shortness of breath. She often wakes up at night with drainage in her throat, leading to episodes of projectile expectoration. She has headache, shortness of breath, and throat discomfort. No ear pain or sore throat. She experienced nasal congestion in the past but not currently.  She has a history of smoking cigarettes and currently uses Flonase for her symptoms. She has tried antihistamines like Allegra and Zyrtec in the past but did not find them very effective. She has not undergone allergy testing before and is concerned about a possible mold allergy, as she lives in a place with mold exposure.  Her father had COPD, but she has not been diagnosed with it and does not use inhalers. She has anxiety about her symptoms and the potential impact of mold exposure on her health.  She does smoke.       Review of Systems   Past Medical History:  Diagnosis Date   Anxiety    Asthma    Chronic constipation    Cystitides, interstitial, chronic    Depression    Gastritis    GERD (gastroesophageal reflux disease)    History of kidney stones    History of ovarian cyst     Current Outpatient Medications  Medication Sig Dispense Refill   alprazolam (XANAX) 2 MG tablet TAKE 1/2 TABLET BY MOUTH 4 TIMES DAILY MAX 2 TABLETS PER DAY 60 tablet 3   amphetamine-dextroamphetamine (ADDERALL) 10 MG tablet Take  by mouth.     conjugated estrogens (PREMARIN) vaginal cream 1 gram intravaginal BIW 60 g 1   escitalopram (LEXAPRO) 5 MG tablet Take 5 mg by mouth daily.     fluticasone (FLONASE) 50 MCG/ACT nasal spray Place 2 sprays into both nostrils daily. 16 g 1   lamoTRIgine (LAMICTAL) 100 MG tablet   4   lidocaine (XYLOCAINE) 2 % jelly 1 application  as needed.     lidocaine (XYLOCAINE) 5 % ointment Apply topically every 12 (twelve) hours as needed. 35.44 g 3   loratadine (CLARITIN) 10 MG tablet Take 1 tablet (10 mg total) by mouth daily. 90 tablet 1   oxyCODONE (ROXICODONE) 15 MG immediate release tablet Take 1 tablet (15 mg total) by mouth in the morning, at noon, in the evening, and at bedtime. 120 tablet 0   Vitamin D, Ergocalciferol, (DRISDOL) 1.25 MG (50000 UNIT) CAPS capsule Take 1 capsule (50,000 Units total) by mouth every 7 (seven) days. 12 capsule 3   No current facility-administered medications for this visit.    Allergies  Allergen Reactions   Prednisone Shortness Of Breath   Acetaminophen Other (See Comments) and Nausea Only    Family History  Problem Relation Age of Onset   Heart attack Mother    Hypertension Father     Social History   Socioeconomic History   Marital status: Single    Spouse name: Not on file  Number of children: Not on file   Years of education: Not on file   Highest education level: Not on file  Occupational History   Not on file  Tobacco Use   Smoking status: Every Day    Current packs/day: 0.50    Average packs/day: 0.5 packs/day for 17.0 years (8.5 ttl pk-yrs)    Types: Cigarettes   Smokeless tobacco: Never   Tobacco comments:    started smoking age 47, 1/2-1 ppd, quit smoking 01/2018  Vaping Use   Vaping status: Never Used  Substance and Sexual Activity   Alcohol use: No   Drug use: No   Sexual activity: Yes    Birth control/protection: None  Other Topics Concern   Not on file  Social History Narrative   Not on file   Social Drivers  of Health   Financial Resource Strain: Not on file  Food Insecurity: Not on file  Transportation Needs: Not on file  Physical Activity: Not on file  Stress: Not on file  Social Connections: Not on file  Intimate Partner Violence: Not on file     Constitutional: Pt reports headache. Denies fever, malaise, fatigue, or abrupt weight changes.  HEENT:  Pt reports runny nose. Denies eye pain, eye redness, ear pain, ringing in the ears, wax buildup,  nasal congestion, bloody nose, or sore throat. Respiratory: Patient reports cough and shortness of breath.  Denies difficulty breathing, or sputum production.   Cardiovascular: Denies chest pain, chest tightness, palpitations or swelling in the hands or feet.  Gastrointestinal: Denies abdominal pain, bloating, constipation, diarrhea or blood in the stool.  GU: Denies urgency, frequency, pain with urination, burning sensation, blood in urine, odor or discharge. Musculoskeletal: Denies decrease in range of motion, difficulty with gait, muscle pain or joint pain and swelling.  Skin: Denies redness, rashes, lesions or ulcercations.  Neurological: Denies dizziness, difficulty with memory, difficulty with speech or problems with balance and coordination.  Psych: Patient has a history of anxiety.  Denies depression, SI/HI.  No other specific complaints in a complete review of systems (except as listed in HPI above).      Objective:   Physical Exam BP 122/82 (BP Location: Right Arm, Patient Position: Sitting, Cuff Size: Normal)   Pulse (!) 114   Ht 5\' 4"  (1.626 m)   Wt 151 lb 3.2 oz (68.6 kg)   LMP  (LMP Unknown)   SpO2 100%   BMI 25.95 kg/m   Wt Readings from Last 3 Encounters:  08/30/23 172 lb (78 kg)  01/08/23 176 lb (79.8 kg)  05/20/22 170 lb 12.8 oz (77.5 kg)    General: Appears her stated age, overweight, in NAD. HEENT: Head: normal shape and size, no sinus tenderness noted; Eyes: sclera white, no icterus, conjunctiva pink, PERRLA and  EOMs intact; Ears: Tm's gray and intact, normal light reflex; Nose: mucosa pink and moist, septum midline; Throat/Mouth: Teeth present, mucosa pink and moist, no exudate, lesions or ulcerations noted.  Neck: No adenopathy noted. Cardiovascular: Tachycardic with normal rhythm. S1,S2 noted.  No murmur, rubs or gallops noted. No JVD or BLE edema. No carotid bruits noted. Pulmonary/Chest: Normal effort and positive vesicular breath sounds. No respiratory distress. No wheezes, rales or ronchi noted.  Neurological: Alert and oriented.   BMET    Component Value Date/Time   NA 136 05/07/2023 0442   NA 139 12/02/2020 1416   K 3.2 (L) 05/07/2023 0442   CL 103 05/07/2023 0442   CO2 25 05/07/2023 0442  GLUCOSE 121 (H) 05/07/2023 0442   BUN 12 05/07/2023 0442   BUN 12 12/02/2020 1416   CREATININE 0.63 05/07/2023 0442   CALCIUM 8.7 (L) 05/07/2023 0442   GFRNONAA >60 05/07/2023 0442   GFRAA 130 12/02/2020 1416    Lipid Panel  No results found for: "CHOL", "TRIG", "HDL", "CHOLHDL", "VLDL", "LDLCALC"  CBC    Component Value Date/Time   WBC 7.0 05/07/2023 0442   RBC 5.00 05/07/2023 0442   HGB 14.8 05/07/2023 0442   HGB 15.4 12/02/2020 1416   HCT 43.4 05/07/2023 0442   HCT 45.1 12/02/2020 1416   PLT 360 05/07/2023 0442   PLT 324 12/02/2020 1416   MCV 86.8 05/07/2023 0442   MCV 89 12/02/2020 1416   MCH 29.6 05/07/2023 0442   MCHC 34.1 05/07/2023 0442   RDW 14.8 05/07/2023 0442   RDW 12.4 12/02/2020 1416   LYMPHSABS 1.7 03/30/2021 0745   LYMPHSABS 2.6 12/02/2018 1600   MONOABS 0.9 03/30/2021 0745   EOSABS 0.2 03/30/2021 0745   EOSABS 0.2 12/02/2018 1600   BASOSABS 0.1 03/30/2021 0745   BASOSABS 0.1 12/02/2018 1600    Hgb A1C No results found for: "HGBA1C"          Assessment & Plan:  Assessment and Plan    Chronic Cough and Shortness of Breath Chronic cough and shortness of breath likely related to smoking and possible mold allergy. No COPD evidence on imaging or  auscultation. - Order mold allergy panel through blood work for common molds. - Refer to allergist if mold panel negative. - Start Xyzal 5 mg daily. - Continue Flonase daily. - Advise smoking cessation.  Anxiety Anxiety related to potential mold exposure and health effects, exacerbated by family influence. Reassured her to avoid premature conclusions until test results are available. - Reassure and advise against premature conclusions until test results are available. - Encourage focus on current management plan and avoid external stressors.        Follow-up with your PCP as previously scheduled Nicki Reaper, NP

## 2024-01-03 ENCOUNTER — Other Ambulatory Visit: Payer: Self-pay | Admitting: Family Medicine

## 2024-01-03 ENCOUNTER — Encounter: Payer: Self-pay | Admitting: Internal Medicine

## 2024-01-03 DIAGNOSIS — G8929 Other chronic pain: Secondary | ICD-10-CM

## 2024-01-03 LAB — ALLERGY PANEL 11, MOLD GROUP
Allergen, A. alternata, m6: <0.1 kU/L
Allergen, Mucor Racemosus, M4: <0.1 kU/L
Aspergillus fumigatus, m3: <0.1 kU/L
CLADOSPORIUM HERBARUM (M2) IGE: <0.1 kU/L
CLASS: 0
CLASS: 0
Candida Albicans: <0.1 kU/L
Class: 0
Class: 0
Class: 0

## 2024-01-03 LAB — INTERPRETATION:

## 2024-01-07 ENCOUNTER — Other Ambulatory Visit: Payer: Self-pay | Admitting: Family Medicine

## 2024-01-07 DIAGNOSIS — G8929 Other chronic pain: Secondary | ICD-10-CM

## 2024-01-18 ENCOUNTER — Other Ambulatory Visit: Payer: Self-pay | Admitting: Family Medicine

## 2024-01-18 DIAGNOSIS — F411 Generalized anxiety disorder: Secondary | ICD-10-CM

## 2024-01-19 ENCOUNTER — Emergency Department
Admission: EM | Admit: 2024-01-19 | Discharge: 2024-01-20 | Disposition: A | Discharge status: Other Acute Inpatient Hospital | Attending: Emergency Medicine | Admitting: Emergency Medicine

## 2024-01-19 DIAGNOSIS — R45851 Suicidal ideations: Secondary | ICD-10-CM | POA: Insufficient documentation

## 2024-01-19 DIAGNOSIS — F22 Delusional disorders: Secondary | ICD-10-CM | POA: Insufficient documentation

## 2024-01-19 DIAGNOSIS — F419 Anxiety disorder, unspecified: Secondary | ICD-10-CM | POA: Insufficient documentation

## 2024-01-19 LAB — CBC
HCT: 44 % (ref 36.0–46.0)
Hemoglobin: 15.2 g/dL — ABNORMAL HIGH (ref 12.0–15.0)
MCH: 29.7 pg (ref 26.0–34.0)
MCHC: 34.5 g/dL (ref 30.0–36.0)
MCV: 85.9 fL (ref 80.0–100.0)
Platelets: 342 10*3/uL (ref 150–400)
RBC: 5.12 MIL/uL — ABNORMAL HIGH (ref 3.87–5.11)
RDW: 14.1 % (ref 11.5–15.5)
WBC: 11 10*3/uL — ABNORMAL HIGH (ref 4.0–10.5)
nRBC: 0 % (ref 0.0–0.2)

## 2024-01-19 LAB — COMPREHENSIVE METABOLIC PANEL WITH GFR
ALT: 17 U/L (ref 0–44)
AST: 19 U/L (ref 15–41)
Albumin: 4.2 g/dL (ref 3.5–5.0)
Alkaline Phosphatase: 73 U/L (ref 38–126)
Anion gap: 8 (ref 5–15)
BUN: 13 mg/dL (ref 6–20)
CO2: 26 mmol/L (ref 22–32)
Calcium: 9.4 mg/dL (ref 8.9–10.3)
Chloride: 101 mmol/L (ref 98–111)
Creatinine, Ser: 0.59 mg/dL (ref 0.44–1.00)
GFR, Estimated: >60 mL/min (ref >60)
Glucose, Bld: 93 mg/dL (ref 70–99)
Potassium: 4.4 mmol/L (ref 3.5–5.1)
Sodium: 135 mmol/L (ref 135–145)
Total Bilirubin: 0.3 mg/dL (ref 0.0–1.2)
Total Protein: 7.8 g/dL (ref 6.5–8.1)

## 2024-01-19 LAB — URINE DRUG SCREEN, QUALITATIVE (ARMC ONLY)
Amphetamines, Ur Screen: POSITIVE — AB
Barbiturates, Ur Screen: NOT DETECTED
Benzodiazepine, Ur Scrn: NOT DETECTED
Cannabinoid 50 Ng, Ur ~~LOC~~: NOT DETECTED
Cocaine Metabolite,Ur ~~LOC~~: NOT DETECTED
MDMA (Ecstasy)Ur Screen: NOT DETECTED
Methadone Scn, Ur: NOT DETECTED
Opiate, Ur Screen: POSITIVE — AB
Phencyclidine (PCP) Ur S: NOT DETECTED
Tricyclic, Ur Screen: NOT DETECTED

## 2024-01-19 LAB — ACETAMINOPHEN LEVEL: Acetaminophen (Tylenol), Serum: <10 ug/mL — ABNORMAL LOW (ref 10–30)

## 2024-01-19 LAB — ETHANOL: Alcohol, Ethyl (B): <10 mg/dL (ref <10)

## 2024-01-19 LAB — SALICYLATE LEVEL: Salicylate Lvl: <7 mg/dL — ABNORMAL LOW (ref 7.0–30.0)

## 2024-01-19 NOTE — ED Notes (Signed)
 After pt was brought over to [facility] and placed in room, pt was observed trying to walk into the rooms of other patients. Pt quickly and strictly informed that she is not allowed to go into any other space that is not her assigned room, the common room, or the bathroom. Pt fixated on getting home to her cats.

## 2024-01-19 NOTE — ED Notes (Signed)
 TTS at the bedside to evaluate pt at this time.

## 2024-01-19 NOTE — ED Triage Notes (Signed)
 Pt presents to the ED POV from home with police department with IVC paperwork. IVC paperwork states that pt is extremely paranoid and thinks that eveyone is spying on her. She thinks that there are cameras everywhere including showers. She also stated on video today that she was going to commit suicide. This RN asked patient if she felt safe at home and in all relationships and patient responded that she does. Pt states that there are moments in time when her emotions are high that she has thoughts of killing herself, but states that she would never act on it and that she would never hurt a fly.

## 2024-01-19 NOTE — ED Notes (Signed)
 Pts family member, Whitney Post, was called and given update on the status of the patient. He was informed that the plan is for the patient to be evaluated by the Psych team, and they will determine the next steps for this patients' care. Logan asked to be updated along the way.

## 2024-01-19 NOTE — ED Notes (Signed)
 Was not able to obtain red top blood tube
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
## 2024-01-19 NOTE — ED Notes (Addendum)
 Belongings collected and inventoried by this RN and [place], [place]. Belongings in one bag.  Belongings include: Orange Glass blower/designer Phone Cigerettes  Pants Orange underwear

## 2024-01-19 NOTE — ED Provider Notes (Signed)
 [facility] Provider Note    Event Date/Time   First MD Initiated Contact with Patient 01/19/24 1936     (approximate)   History   IVC   HPI  Martha Guerra is a 43 y.o. female who presents to the emergency department today under IVC because of concerns for paranoia and suicidal ideation.  The patient herself states that there are in fact people watching her.  She says that she does occasionally make comments about hurting herself but she only does that when she is angry and she has no thoughts of actually harming herself.  She denies any medical complaints.     Physical Exam   Triage Vital Signs: ED Triage Vitals  Encounter Vitals Group     BP 01/19/24 1825 (P) 124/87     Systolic BP Percentile --      Diastolic BP Percentile --      Pulse Rate 01/19/24 1825 (!) (P) 109     Resp 01/19/24 1825 (P) 17     Temp 01/19/24 1825 (P) 99.3 F (37.4 C)     Temp src --      SpO2 01/19/24 1827 98 %     Weight 01/19/24 1827 150 lb (68 kg)     Height 01/19/24 1827 5\' 5"  (1.651 m)     Head Circumference --      Peak Flow --      Pain Score 01/19/24 1827 0     Pain Loc --      Pain Education --      Exclude from Growth Chart --     Most recent vital signs: Vitals:   01/19/24 1825 01/19/24 1827  BP: (P) 124/87 124/87  Pulse: (!) (P) 109 (!) 111  Resp: (P) 17 18  Temp: (P) 99.3 F (37.4 C) 99.3 F (37.4 C)  SpO2:  98%   General: Awake, alert. CV:  Good peripheral perfusion.  Resp:  Normal effort.  Abd:  No distention.  Psych:  Anxious, not completely forthcoming with answers.    ED Results / Procedures / Treatments   Labs (all labs ordered are listed, but only abnormal results are displayed) Labs Reviewed  URINE DRUG SCREEN, QUALITATIVE ([facility] ONLY) - Abnormal; Notable for the following components:      Result Value   Amphetamines, Ur Screen POSITIVE (*)    Opiate, Ur Screen POSITIVE (*)    All other components within normal limits   ACETAMINOPHEN LEVEL - Abnormal; Notable for the following components:   Acetaminophen (Tylenol), Serum <10 (*)    All other components within normal limits  SALICYLATE LEVEL - Abnormal; Notable for the following components:   Salicylate Lvl <7.0 (*)    All other components within normal limits  CBC - Abnormal; Notable for the following components:   WBC 11.0 (*)    RBC 5.12 (*)    Hemoglobin 15.2 (*)    All other components within normal limits  ETHANOL  COMPREHENSIVE METABOLIC PANEL WITH GFR  POC URINE PREG, ED     EKG  None   RADIOLOGY None  PROCEDURES:  Critical Care performed: No    MEDICATIONS ORDERED IN ED: Medications - No data to display   IMPRESSION / MDM / ASSESSMENT AND PLAN / ED COURSE  I reviewed the triage vital signs and the nursing notes.  Differential diagnosis includes, but is not limited to, psychosis, drug induced mood disorder  Patient's presentation is most consistent with acute presentation with potential threat to life or bodily function.   Patient presents to the emergency department today under IVC because of an concerns for paranoia and self-harm.  Denies any medical complaints.  Will have psychiatry evaluate.  The patient has been placed in psychiatric observation due to the need to provide a safe environment for the patient while obtaining psychiatric consultation and evaluation, as well as ongoing medical and medication management to treat the patient's condition.  The patient has been placed under full IVC at this time.       FINAL CLINICAL IMPRESSION(S) / ED DIAGNOSES   Final diagnoses:  Paranoia (HCC)     Note:  This document was prepared using Dragon voice recognition software and may include unintentional dictation errors.    Phineas Semen, MD 01/19/24 2028

## 2024-01-19 NOTE — Consult Note (Incomplete)
 [facility] Psychiatric Consult Initial  Patient Name: .Martha Guerra  MRN: [id number]  DOB: [birth year]  Consult Order details:  Orders (From admission, onward)     Start     Ordered   01/19/24 2026  CONSULT TO CALL ACT TEAM       Ordering Provider: Phineas Semen, MD  Provider:  (Not yet assigned)  Question:  Reason for Consult?  Answer:  ivc   01/19/24 2026   01/19/24 2026  IP CONSULT TO PSYCHIATRY       Ordering Provider: Phineas Semen, MD  Provider:  (Not yet assigned)  Question Answer Comment  Place call to: psychiatry   Reason for Consult Consult   Diagnosis/Clinical Info for Consult: ivc      01/19/24 2026             Mode of Visit: Tele-visit Virtual Statement:TELE PSYCHIATRY ATTESTATION & CONSENT As the provider for this telehealth consult, I attest that I verified the patient's identity using two separate identifiers, introduced myself to the patient, provided my credentials, disclosed my location, and performed this encounter via a HIPAA-compliant, real-time, face-to-face, two-way, interactive audio and video platform and with the full consent and agreement of the patient (or guardian as applicable.) Patient physical location: [facility]. Telehealth provider physical location: home office in state of [place].   Video start time:   Video end time:      Psychiatry Consult Evaluation  Service Date: January 19, 2024 LOS:  LOS: 0 days  Chief Complaint "Upset because there is spyware everywhere"  Primary Psychiatric Diagnoses  *** 2.  *** 3.  ***  Assessment  Martha Guerra is a 43 y.o. female admitted: Presented to the [facility] 01/19/2024  7:36 PM for ***. She carries the psychiatric diagnoses of *** and has a past medical history of  ***.   Her current presentation of *** is most consistent with ***. She meets criteria for *** based on ***.  Current outpatient psychotropic medications include *** and historically she has had a ***  response to these medications. She was *** compliant with medications prior to admission as evidenced by ***. On initial examination, patient ***. Please see plan below for detailed recommendations.   Diagnoses:  Active Hospital problems: Active Problems:   * No active hospital problems. *    Plan   ## Psychiatric Medication Recommendations:  ***  ## Medical Decision Making Capacity: {CHL BH MEDICAL DECISION MAKING CAPACITY:31818}  ## Further Work-up:  -- *** {CHLmacgeneralandspecificworkuprecs:31821} -- most recent EKG on *** had QtC of *** -- Pertinent labwork reviewed earlier this admission includes: ***   ## Disposition:-- {CHLmaccldispo:31820}  ## Behavioral / Environmental: -{CHLmacbehavioralenvironmental2:31847}    ## Safety and Observation Level:  - Based on my clinical evaluation, I estimate the patient to be at *** risk of self harm in the current setting. - At this time, we recommend  {CHL BH SUICIDE OBSERVATION LEVEL:31850}. This decision is based on my review of the chart including patient's history and current presentation, interview of the patient, mental status examination, and consideration of suicide risk including evaluating suicidal ideation, plan, intent, suicidal or self-harm behaviors, risk factors, and protective factors. This judgment is based on our ability to directly address suicide risk, implement suicide prevention strategies, and develop a safety plan while the patient is in the clinical setting. Please contact our team if there is a concern that risk level has changed.  CSSR Risk Category:C-SSRS RISK CATEGORY: Moderate Risk  Suicide Risk Assessment: Patient has following modifiable risk factors for suicide: {CHLmacmodifiablesuicideriskfactors:31822}, which we are addressing by ***. Patient has following non-modifiable or demographic risk factors for suicide: {CHLmacnonmodifiablesuicideriskfactors:31823} Patient has the following protective factors  against suicide: {CHLmacprotectivefactors:31824}  Thank you for this consult request. Recommendations have been communicated to the primary team.  We will *** at this time.   De Burrs, NP       History of Present Illness  Relevant Aspects of [facility] or ED course:31819} Course:  Admitted on 01/19/2024 for ***. They ***.   Patient Report:  ***  Psych ROS:  Depression: *** Anxiety:  *** Mania (lifetime and current): *** Psychosis: (lifetime and current): ***  Collateral information:  Contacted *** at *** on ***  ROS   Psychiatric and Social History  Psychiatric History:  Information collected from ***  Prev Dx/Sx: *** Current Psych Provider: *** Home Meds (current): *** Previous Med Trials: *** Therapy: ***  Prior Psych Hospitalization: ***  Prior Self Harm: *** Prior Violence: ***  Family Psych History: *** Family Hx suicide: ***  Social History:  Developmental Hx: *** Educational Hx: *** Occupational Hx: *** Legal Hx: *** Living Situation: *** Spiritual Hx: *** Access to weapons/lethal means: ***   Substance History Alcohol: ***  Type of alcohol *** Last Drink *** Number of drinks per day *** History of alcohol withdrawal seizures *** History of DT's *** Tobacco: *** Illicit drugs: *** Prescription drug abuse: *** Rehab hx: ***  Exam Findings  Physical Exam: *** Vital Signs:  Temp:  [99.3 F (37.4 C)] 99.3 F (37.4 C) (04/02 1827) Pulse Rate:  [109-111] 111 (04/02 1827) Resp:  [17-18] 18 (04/02 1827) BP: (124)/(87) 124/87 (04/02 1827) SpO2:  [98 %] 98 % (04/02 1827) Weight:  [68 kg] 68 kg (04/02 1827) Blood pressure 124/87, pulse (!) 111, temperature 99.3 F (37.4 C), resp. rate 18, height 5\' 5"  (1.651 m), weight 68 kg, SpO2 98%. Body mass index is 24.96 kg/m.  Physical Exam  Mental Status Exam: General Appearance: {Appearance:22683}  Orientation:  {BHH ORIENTATION (PAA):22689}  Memory:  {BHH MEMORY:22881}   Concentration:  {Concentration:21399}  Recall:  {BHH GOOD/FAIR/POOR:22877}  Attention  {BH Attention Span:31825}  Eye Contact:  {BHH EYE CONTACT:22684}  Speech:  {Speech:22685}  Language:  {BHH GOOD/FAIR/POOR:22877}  Volume:  {Volume (PAA):22686}  Mood: ***  Affect:  {Affect (PAA):22687}  Thought Process:  {Thought Process (PAA):22688}  Thought Content:  {Thought Content:22690}  Suicidal Thoughts:  {ST/HT (PAA):22692}  Homicidal Thoughts:  {ST/HT (PAA):22692}  Judgement:  {Judgement (PAA):22694}  Insight:  {Insight (PAA):22695}  Psychomotor Activity:  {Psychomotor (PAA):22696}  Akathisia:  {BHH YES OR NO:22294}  Fund of Knowledge:  {BHH GOOD/FAIR/POOR:22877}      Assets:  {Assets (PAA):22698}  Cognition:  {chl bhh cognition:304700322}  ADL's:  {BHH GEX'B:28413}  AIMS (if indicated):        Other History   These have been pulled in through the EMR, reviewed, and updated if appropriate.  Family History:  The patient's family history includes Heart attack in her mother; Hypertension in her father.  Medical History: Past Medical History:  Diagnosis Date  . Anxiety   . Asthma   . Chronic constipation   . Cystitides, interstitial, chronic   . Depression   . Gastritis   . GERD (gastroesophageal reflux disease)   . History of kidney stones   . History of ovarian cyst     Surgical History: Past Surgical History:  Procedure Laterality Date  .  cystectomy    . OVARIAN CYST SURGERY       Medications:  No current facility-administered medications for this encounter.  Current Outpatient Medications:  .  alprazolam (XANAX) 2 MG tablet, TAKE 1/2 TABLET BY MOUTH 4 TIMES DAILY MAX 2 TABLETS PER DAY, Disp: 60 tablet, Rfl: 3 .  amphetamine-dextroamphetamine (ADDERALL) 10 MG tablet, Take by mouth., Disp: , Rfl:  .  conjugated estrogens (PREMARIN) vaginal cream, 1 gram intravaginal BIW, Disp: 60 g, Rfl: 1 .  escitalopram (LEXAPRO) 5 MG tablet, Take 5 mg by mouth daily., Disp: ,  Rfl:  .  fluticasone (FLONASE) 50 MCG/ACT nasal spray, Place 2 sprays into both nostrils daily., Disp: 16 g, Rfl: 1 .  lamoTRIgine (LAMICTAL) 100 MG tablet, , Disp: , Rfl: 4 .  lidocaine (XYLOCAINE) 2 % jelly, 1 application  as needed., Disp: , Rfl:  .  lidocaine (XYLOCAINE) 5 % ointment, Apply topically every 12 (twelve) hours as needed., Disp: 35.44 g, Rfl: 3 .  loratadine (CLARITIN) 10 MG tablet, Take 1 tablet (10 mg total) by mouth daily., Disp: 90 tablet, Rfl: 1 .  oxyCODONE (ROXICODONE) 15 MG immediate release tablet, Take 1 tablet (15 mg total) by mouth 4 (four) times daily., Disp: 120 tablet, Rfl: 0 .  Vitamin D, Ergocalciferol, (DRISDOL) 1.25 MG (50000 UNIT) CAPS capsule, Take 1 capsule (50,000 Units total) by mouth every 7 (seven) days., Disp: 12 capsule, Rfl: 3  Allergies: Allergies  Allergen Reactions  . Prednisone Shortness Of Breath  . Acetaminophen Other (See Comments) and Nausea Only    De Burrs, NP
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
## 2024-01-20 DIAGNOSIS — F22 Delusional disorders: Secondary | ICD-10-CM | POA: Diagnosis not present

## 2024-01-20 NOTE — ED Provider Notes (Signed)
 Emergency Medicine Observation Re-evaluation Note  Martha Guerra is a 43 y.o. female, seen on rounds today.  Pt initially presented to the ED for complaints of IVC Currently, the patient is resting comfortably no acute distress.  Physical Exam  BP 124/87   Pulse (!) 111   Temp 99.3 F (37.4 C)   Resp 18   Ht 5\' 5"  (1.651 m)   Wt 68 kg   LMP  (LMP Unknown)   SpO2 98%   BMI 24.96 kg/m  Physical Exam General: No acute distress Cardiac: Well-perfused Lungs: No respiratory distress Psych: Not agitated  ED Course / MDM  EKG:   I have reviewed the labs performed to date as well as medications administered while in observation.  Recent changes in the last 24 hours include.  Plan  Current plan is for psychiatric disposition recommendations.    Marinell Blight, MD 01/20/24 [phone]
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
## 2024-01-20 NOTE — BH Assessment (Signed)
 Per [facility] Selena Batten), patient to be referred out of system.  Referral information for Psychiatric Hospitalization faxed to:   [facility] [phone]- [phone]) No beds available at this time.  [facility] [phone]),  [place] [phone]--- [phone]--- [phone])  [address] [phone]),   [facility] [phone] -or- [phone]),   [facility] [phone])  [place] [phone])  [facility] [phone])

## 2024-01-20 NOTE — BH Assessment (Signed)
 Patient has been accepted to [facility].  Accepting physician is Dr. Janalyn Rouse.  Call report to [phone].  Representative was [employer].   ER Staff is aware of it:  Jackie Plum, ER Secretary  Dr. Anner Crete, ER MD  Al[place], Patient's Nurse     Patient can arrive at facility 01/20/24 anytime after 11 AM.
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
## 2024-01-20 NOTE — ED Notes (Signed)
 ivc prior to arrival/consult done/recommended for inpatient psychiatric hospitalization after medical hospitalization.

## 2024-01-20 NOTE — ED Notes (Signed)
 Pt going to [employer] transportation. Pt arguing with staff but eventually agreed to go after she was informed that she cannot leave here and go home. Pt informed she is IVC and she will have to go to [facility] for inpatient treatment.  Stable, ambulatory and in NAD. Escorted out of unit to Emergency planning/management officer in hallway who left with pt.

## 2024-01-20 NOTE — ED Notes (Signed)
[place]Y  SHERIFF  DEPT  CALLED  FOR  TRANSPORT TO  [facility] 

## 2024-01-20 NOTE — ED Notes (Signed)
 Pt to restroom at this time

## 2024-01-21 ENCOUNTER — Ambulatory Visit: Admitting: Family Medicine

## 2024-01-21 DIAGNOSIS — F250 Schizoaffective disorder, bipolar type: Secondary | ICD-10-CM | POA: Diagnosis not present

## 2024-01-22 DIAGNOSIS — F250 Schizoaffective disorder, bipolar type: Secondary | ICD-10-CM | POA: Diagnosis not present

## 2024-01-23 DIAGNOSIS — G5703 Lesion of sciatic nerve, bilateral lower limbs: Secondary | ICD-10-CM | POA: Diagnosis not present

## 2024-01-23 DIAGNOSIS — G894 Chronic pain syndrome: Secondary | ICD-10-CM | POA: Diagnosis not present

## 2024-01-24 DIAGNOSIS — F250 Schizoaffective disorder, bipolar type: Secondary | ICD-10-CM | POA: Diagnosis not present

## 2024-01-25 DIAGNOSIS — F250 Schizoaffective disorder, bipolar type: Secondary | ICD-10-CM | POA: Diagnosis not present

## 2024-01-26 DIAGNOSIS — F250 Schizoaffective disorder, bipolar type: Secondary | ICD-10-CM | POA: Diagnosis not present

## 2024-01-27 DIAGNOSIS — F250 Schizoaffective disorder, bipolar type: Secondary | ICD-10-CM | POA: Diagnosis not present

## 2024-02-07 ENCOUNTER — Other Ambulatory Visit: Payer: Self-pay | Admitting: Family Medicine

## 2024-02-07 DIAGNOSIS — G8929 Other chronic pain: Secondary | ICD-10-CM

## 2024-02-21 ENCOUNTER — Ambulatory Visit: Admitting: Family Medicine

## 2024-02-21 ENCOUNTER — Encounter: Payer: Self-pay | Admitting: Family Medicine

## 2024-02-21 VITALS — BP 118/81 | HR 108 | Temp 97.8°F | Resp 16 | Ht 65.0 in | Wt 146.7 lb

## 2024-02-21 DIAGNOSIS — N644 Mastodynia: Secondary | ICD-10-CM

## 2024-02-21 MED ORDER — DOXYCYCLINE HYCLATE 100 MG PO TABS: 100.0000 mg | ORAL_TABLET | Freq: Two times a day (BID) | ORAL | 0 refills | Status: AC

## 2024-02-21 NOTE — Progress Notes (Signed)
      Established patient visit   Patient: Martha Guerra   DOB: 1981/07/05   43 y.o. Female  MRN: [id number] Visit Date: 02/21/2024  Today's healthcare provider: Jeralene Mom, MD   Chief Complaint  Patient presents with   Breast Problem    Lump around the nipple. Symptoms: sore,itching,tender, burns inside.   Subjective    HPI Discussed the use of AI scribe software for clinical note transcription with the patient, who gave verbal consent to proceed.  History of Present Illness   Martha Guerra "Dalbert Dubois" is a 43 year old female who presents with breast pain and soreness.  She has been experiencing soreness in her breast for several months, with more pronounced pain on the right side. The pain worsens when her cats press on her chest. She has noticed a lump under the right areola in the area, described as 'really sore'. The pain is also present on the left nipple area, though it is less severe.  No drainage has been observed from the area. She previously applied tape to the area but has since removed it. She does have a history of hyperprolactinemia and would like to have this rechecked.       Medications: Outpatient Medications Prior to Visit  Medication Sig   alprazolam  (XANAX ) 2 MG tablet TAKE 1/2 TABLET BY MOUTH 4 TIMES DAILY MAX 2 TABLETS PER DAY (Patient taking differently: Take 1 mg by mouth 4 (four) times daily.)   amphetamine-dextroamphetamine (ADDERALL) 20 MG tablet Take 20 mg by mouth 3 (three) times daily.   escitalopram (LEXAPRO) 5 MG tablet Take 5 mg by mouth daily.   oxyCODONE  (ROXICODONE ) 15 MG immediate release tablet Take 1 tablet (15 mg total) by mouth 4 (four) times daily.   Vitamin D , Ergocalciferol , (DRISDOL ) 1.25 MG (50000 UNIT) CAPS capsule Take 1 capsule (50,000 Units total) by mouth every 7 (seven) days.   No facility-administered medications prior to visit.    Review of Systems     Objective    BP 118/81 (BP Location: Left Arm, Patient  Position: Sitting, Cuff Size: Normal)   Pulse (!) 108   Temp 97.8 F (36.6 C) (Oral)   Resp 16   Ht 5\' 5"  (1.651 m)   Wt 146 lb 11.2 oz (66.5 kg)   LMP 02/01/2024   SpO2 98%   BMI 24.41 kg/m    Physical Exam   Breast: Tender swollen indurated right lateral areola. No discharger. No breast parenchymal masses. Slight tenderness of left areola.    Assessment & Plan     1. Mastalgia (Primary) History of elevated prolactin level which had normalized when last checked in July of 2024 - Prolactin  2. Areolar infection, possibly small abscess Symptoms present for months. Will start doxycycline  (VIBRA -TABS) 100 MG tablet; Take 1 tablet (100 mg total) by mouth 2 (two) times daily for 10 days.  Dispense: 20 tablet; Refill: 0   Recheck in 2 weeks. Advised she does nee mammogram once current sx are under control         Jeralene Mom, MD  [facility] [phone] (phone) [phone] (fax)  [facility]

## 2024-02-21 NOTE — Patient Instructions (Signed)
 Marland Kitchen  Please review the attached list of medications and notify my office if there are any errors.   . Please bring all of your medications to every appointment so we can make sure that our medication list is the same as yours.

## 2024-02-22 ENCOUNTER — Encounter: Payer: Self-pay | Admitting: Family Medicine

## 2024-02-22 LAB — PROLACTIN: Prolactin: 22.6 ng/mL (ref 4.8–33.4)

## 2024-02-28 ENCOUNTER — Encounter (HOSPITAL_COMMUNITY): Payer: Self-pay

## 2024-03-06 ENCOUNTER — Ambulatory Visit: Admitting: Family Medicine

## 2024-03-08 ENCOUNTER — Other Ambulatory Visit: Payer: Self-pay | Admitting: Family Medicine

## 2024-03-08 DIAGNOSIS — G8929 Other chronic pain: Secondary | ICD-10-CM

## 2024-04-06 ENCOUNTER — Other Ambulatory Visit: Payer: Self-pay | Admitting: Family Medicine

## 2024-04-06 DIAGNOSIS — G8929 Other chronic pain: Secondary | ICD-10-CM

## 2024-04-07 ENCOUNTER — Telehealth: Payer: Self-pay

## 2024-04-07 ENCOUNTER — Other Ambulatory Visit (HOSPITAL_COMMUNITY): Payer: Self-pay

## 2024-04-07 NOTE — Telephone Encounter (Signed)
 Pharmacy Patient Advocate Encounter   Received notification from Onbase that prior authorization for oxyCODONE  HCl 15MG  tablets  is required/requested.   Insurance verification completed.   The patient is insured through [facility] .   Per test claim: PA required; PA submitted to above mentioned insurance via CoverMyMeds Key/confirmation #/[id number] Status is pending

## 2024-04-19 ENCOUNTER — Other Ambulatory Visit (HOSPITAL_COMMUNITY): Payer: Self-pay

## 2024-04-19 NOTE — Telephone Encounter (Signed)
 Pharmacy Patient Advocate Encounter  Received notification from [facility] that Prior Authorization for oxyCODONE  HCl 15MG  tablets  has been APPROVED from 04/07/24 to 10/04/24   PA #/Case ID/Reference #: [id number]

## 2024-05-05 ENCOUNTER — Other Ambulatory Visit: Payer: Self-pay | Admitting: Family Medicine

## 2024-05-05 DIAGNOSIS — G8929 Other chronic pain: Secondary | ICD-10-CM

## 2024-05-08 ENCOUNTER — Telehealth: Payer: Self-pay

## 2024-05-08 ENCOUNTER — Other Ambulatory Visit (HOSPITAL_COMMUNITY): Payer: Self-pay

## 2024-05-08 NOTE — Telephone Encounter (Signed)
*  [employer] Patient Advocate Encounter   Received notification from Fax that prior authorization for oxyCODONE  HCl 15MG  tablets  is required/requested.   Insurance verification completed.   The patient is insured through [employer] .   Per test claim: PA required; PA submitted to above mentioned insurance via CoverMyMeds Key/confirmation #/EOC [facility] Status is pending

## 2024-05-09 ENCOUNTER — Other Ambulatory Visit (HOSPITAL_COMMUNITY): Payer: Self-pay

## 2024-05-10 ENCOUNTER — Other Ambulatory Visit (HOSPITAL_COMMUNITY): Payer: Self-pay

## 2024-05-10 NOTE — Telephone Encounter (Signed)
 Pharmacy Patient Advocate Encounter  Received notification from [facility] that Prior Authorization for oxyCODONE  HCl 15MG  tablets  has been APPROVED from 05/09/2024 to 11/09/2024. Ran test claim, Copay is $4.00. This test claim was processed through [facility]- copay amounts may vary at other pharmacies due to pharmacy/plan contracts, or as the patient moves through the different stages of their insurance plan.

## 2024-05-26 ENCOUNTER — Other Ambulatory Visit: Payer: Self-pay | Admitting: Family Medicine

## 2024-05-26 DIAGNOSIS — F411 Generalized anxiety disorder: Secondary | ICD-10-CM

## 2024-06-05 ENCOUNTER — Other Ambulatory Visit: Payer: Self-pay | Admitting: Family Medicine

## 2024-06-05 DIAGNOSIS — G8929 Other chronic pain: Secondary | ICD-10-CM

## 2024-06-08 ENCOUNTER — Other Ambulatory Visit (HOSPITAL_COMMUNITY): Payer: Self-pay

## 2024-06-16 ENCOUNTER — Other Ambulatory Visit (HOSPITAL_COMMUNITY): Payer: Self-pay

## 2024-07-06 ENCOUNTER — Other Ambulatory Visit: Payer: Self-pay | Admitting: Family Medicine

## 2024-07-06 DIAGNOSIS — G8929 Other chronic pain: Secondary | ICD-10-CM

## 2024-07-11 ENCOUNTER — Other Ambulatory Visit: Payer: Self-pay

## 2024-07-25 ENCOUNTER — Other Ambulatory Visit: Payer: Self-pay | Admitting: Family Medicine

## 2024-07-25 DIAGNOSIS — F411 Generalized anxiety disorder: Secondary | ICD-10-CM

## 2024-08-04 ENCOUNTER — Other Ambulatory Visit: Payer: Self-pay | Admitting: Family Medicine

## 2024-08-04 DIAGNOSIS — G8929 Other chronic pain: Secondary | ICD-10-CM

## 2024-08-10 ENCOUNTER — Ambulatory Visit: Payer: Self-pay

## 2024-08-10 ENCOUNTER — Other Ambulatory Visit (HOSPITAL_COMMUNITY): Payer: Self-pay

## 2024-08-10 NOTE — Telephone Encounter (Signed)
 FYI Only or Action Required?: FYI only for provider.  Patient was last seen in primary care on 02/21/2024 by Martha Nancyann BRAVO, MD.  Called Nurse Triage reporting Depression.  Symptoms began several weeks ago.  Interventions attempted: Nothing.  Symptoms are: gradually worsening.  Triage Disposition: See Physician Within 24 Hours  Patient/caregiver understands and will follow disposition?: Yes     Copied from CRM #[id number]. Topic: Clinical - Red Word Triage >> Aug 10, 2024  3:52 PM Montie POUR wrote: Red Word that prompted transfer to Nurse Triage:  Her depression and anxiety has gotten worse. Her mental health doctor retired and she has been out of her medications for 2 months. She has had withdrawals and she does not think she is having them now. Reason for Disposition  [1] Depression AND [2] getting worse (e.g., sleeping poorly, less able to do activities of daily living)  Answer Assessment - Initial Assessment Questions Pt states her mental health provider retired about 2 months ago and she was supposed to get some information on who to switch to but states she never got that. She has been out of her medications for about 2 months. She states she was hoping her brain was reconstructing itself that she wouldn't need the medications but she is having worsening depression symptoms. RN offered pt appt for tomorrow but states she needs an appt for Monday due to finances. Rn did give patient behavioral health number if she needs any immediate assistance.    1. CONCERN: What happened that made you call today?     Worsening symptoms 2. DEPRESSION SYMPTOM SCREENING: How are you feeling overall? (e.g., decreased energy, increased sleeping or difficulty sleeping, difficulty concentrating, feelings of sadness, guilt, hopelessness, or worthlessness)     Anxiety, mood swings, severe hopelessness 3. RISK OF HARM - SUICIDAL IDEATION:  Do you ever have thoughts of hurting or killing yourself?   (e.g., yes, no, no but preoccupation with thoughts about death)     no 4. RISK OF HARM - HOMICIDAL IDEATION:  Do you ever have thoughts of hurting or killing someone else?  (e.g., yes, no, no but preoccupation with thoughts about death)     no 5. FUNCTIONAL IMPAIRMENT: How have things been going for you overall? Have you had more difficulty than usual doing your normal daily activities?  (e.g., better, same, worse; self-care, school, work, interactions)     yes 6. SUPPORT: Who is with you now? Who do you live with? Do you have family or friends who you can talk to?      No  7. THERAPIST: Do you have a counselor or therapist? If Yes, ask: What is their name?     Not currently 8. STRESSORS: Has there been any new stress or recent changes in your life?     Home environment- stays isolation 9. ALCOHOL USE OR SUBSTANCE USE (DRUG USE): Do you drink alcohol or use any illegal drugs?     Alcohol occassionally 10. OTHER: Do you have any other physical symptoms right now? (e.g., fever)       No, fatigue, then effects appetite  Protocols used: Depression-A-AH

## 2024-08-14 ENCOUNTER — Ambulatory Visit (INDEPENDENT_AMBULATORY_CARE_PROVIDER_SITE_OTHER): Payer: MEDICAID | Admitting: Family Medicine

## 2024-08-14 ENCOUNTER — Encounter: Payer: Self-pay | Admitting: Family Medicine

## 2024-08-14 VITALS — BP 123/86 | HR 99 | Temp 98.5°F | Ht 65.0 in | Wt 145.4 lb

## 2024-08-14 DIAGNOSIS — F909 Attention-deficit hyperactivity disorder, unspecified type: Secondary | ICD-10-CM | POA: Diagnosis not present

## 2024-08-14 DIAGNOSIS — F332 Major depressive disorder, recurrent severe without psychotic features: Secondary | ICD-10-CM

## 2024-08-14 DIAGNOSIS — F3163 Bipolar disorder, current episode mixed, severe, without psychotic features: Secondary | ICD-10-CM

## 2024-08-14 DIAGNOSIS — F411 Generalized anxiety disorder: Secondary | ICD-10-CM | POA: Diagnosis not present

## 2024-08-14 DIAGNOSIS — F410 Panic disorder [episodic paroxysmal anxiety] without agoraphobia: Secondary | ICD-10-CM

## 2024-08-14 DIAGNOSIS — Z1231 Encounter for screening mammogram for malignant neoplasm of breast: Secondary | ICD-10-CM

## 2024-08-14 DIAGNOSIS — J301 Allergic rhinitis due to pollen: Secondary | ICD-10-CM

## 2024-08-14 MED ORDER — ESCITALOPRAM OXALATE 5 MG PO TABS: 5.0000 mg | ORAL_TABLET | Freq: Every day | ORAL | 1 refills | Status: DC

## 2024-08-14 MED ORDER — AMPHETAMINE-DEXTROAMPHETAMINE 20 MG PO TABS: 20.0000 mg | ORAL_TABLET | Freq: Two times a day (BID) | ORAL | 0 refills | Status: DC

## 2024-08-14 NOTE — Progress Notes (Unsigned)
   Acute Office Visit  Introduced to nurse practitioner role and practice setting.  All questions answered.  Discussed provider/patient relationship and expectations.   Subjective:     Patient ID: Andree Etha Shallow, female    DOB: 1981-08-08, 43 y.o.   MRN: [id number]  Chief Complaint  Patient presents with  . Acute Visit    Patient is here due to increased depression and want a referral to psychiatry because hers retired.    Discussed the use of AI scribe software for clinical note transcription with the patient, who gave verbal consent to proceed.  History of Present Illness      HPI  ROS      Objective:    BP 123/86 (BP Location: Left Arm, Patient Position: Sitting, Cuff Size: Normal)   Pulse 99   Temp 98.5 F (36.9 C) (Oral)   Ht 5' 5 (1.651 m)   Wt 145 lb 6.4 oz (66 kg)   SpO2 100%   BMI 24.20 kg/m  {Vitals History (Optional):23777}  Physical Exam  No results found for any visits on 08/14/24.      Assessment & Plan:  Assessment and Plan Assessment & Plan      Problem List Items Addressed This Visit   None   No orders of the defined types were placed in this encounter.   No follow-ups on file.  Curtis DELENA Boom, FNP  I, Curtis DELENA Boom, FNP, have reviewed all documentation for this visit. The documentation on 08/14/24 for the exam, diagnosis, procedures, and orders are all accurate and complete.

## 2024-08-14 NOTE — Patient Instructions (Signed)

## 2024-08-15 ENCOUNTER — Other Ambulatory Visit (HOSPITAL_COMMUNITY): Payer: Self-pay

## 2024-08-15 ENCOUNTER — Encounter: Payer: Self-pay | Admitting: Family Medicine

## 2024-09-04 ENCOUNTER — Other Ambulatory Visit: Payer: Self-pay | Admitting: Family Medicine

## 2024-09-04 DIAGNOSIS — G8929 Other chronic pain: Secondary | ICD-10-CM

## 2024-09-06 ENCOUNTER — Ambulatory Visit (INDEPENDENT_AMBULATORY_CARE_PROVIDER_SITE_OTHER): Payer: MEDICAID | Admitting: Family Medicine

## 2024-09-06 ENCOUNTER — Ambulatory Visit
Admission: RE | Admit: 2024-09-06 | Discharge: 2024-09-06 | Disposition: A | Discharge status: Home / Self Care | Payer: MEDICAID | Attending: Family Medicine | Admitting: Family Medicine

## 2024-09-06 ENCOUNTER — Encounter: Payer: Self-pay | Admitting: Family Medicine

## 2024-09-06 ENCOUNTER — Ambulatory Visit
Admission: RE | Admit: 2024-09-06 | Discharge: 2024-09-06 | Disposition: A | Discharge status: Home / Self Care | Payer: MEDICAID | Source: Ambulatory Visit | Attending: Family Medicine | Admitting: Family Medicine

## 2024-09-06 VITALS — Resp 16 | Wt 153.9 lb

## 2024-09-06 DIAGNOSIS — F332 Major depressive disorder, recurrent severe without psychotic features: Secondary | ICD-10-CM | POA: Diagnosis not present

## 2024-09-06 DIAGNOSIS — M533 Sacrococcygeal disorders, not elsewhere classified: Secondary | ICD-10-CM

## 2024-09-06 DIAGNOSIS — F909 Attention-deficit hyperactivity disorder, unspecified type: Secondary | ICD-10-CM

## 2024-09-06 NOTE — Progress Notes (Signed)
 Established patient visit   Patient: Martha Guerra   DOB: April 27, 1981   43 y.o. Female  MRN: [id number] Visit Date: 09/06/2024  Today's healthcare provider: Nancyann Perry, MD   Chief Complaint  Patient presents with   Medical Management of Chronic Issues    3 week follow-up mood   Subjective    Discussed the use of AI scribe software for clinical note transcription with the patient, who gave verbal consent to proceed.  History of Present Illness   Martha Guerra is a 43 year old female who presents with concerns about tailbone pain and medication management.  She has been experiencing tailbone pain following a fall approximately two to three weeks ago. The pain is constant, aching, and throbbing, with the tailbone protruding and feeling crooked. Sitting exacerbates the pain as the tailbone 'sticks into the chair'. She has a history of tailbone fractures, with a previous incident in 2014. She is concerned about the appearance and discomfort of the tailbone, noting it 'goes all the way to the right and sticks out'.  She is currently taking Lexapro 5 mg, which she restarted recently after a break. The medication helps her feel less emotional, more steady, and mentally clearer. She has a history of taking Lexapro from October to June of the previous year. She is unsure about when or if she should increase the dose to 10 mg. She has been referred to a psychiatrist in [place] for further management but has not yet scheduled an appointment due to Medicaid-related issues.  She is also taking Adderall, which helps her think clearly.       Medications: Outpatient Medications Prior to Visit  Medication Sig   alprazolam  (XANAX ) 2 MG tablet TAKE 1/2 TABLET BY MOUTH 4 TIMES DAILY MAX 2 TABLETS PER DAY   amphetamine-dextroamphetamine (ADDERALL) 20 MG tablet Take 1 tablet (20 mg total) by mouth 2 (two) times daily.   escitalopram (LEXAPRO) 5 MG tablet Take 1 tablet (5 mg  total) by mouth daily.   oxyCODONE  (ROXICODONE ) 15 MG immediate release tablet TAKE 1 TABLET BY MOUTH 4 TIMES DAILY   Vitamin D , Ergocalciferol , (DRISDOL ) 1.25 MG (50000 UNIT) CAPS capsule TAKE 1 CAPSULE BY MOUTH ONCE EVERY 7 DAYS   No facility-administered medications prior to visit.        Objective    Resp 16   Wt 153 lb 14.4 oz (69.8 kg)   LMP 08/21/2024   SpO2 97%   BMI 25.61 kg/m   Physical Exam   General appearance: Well developed, well nourished female, cooperative and in no acute distress Head: Normocephalic, without obvious abnormality, atraumatic Respiratory: Respirations even and unlabored, normal respiratory rate Extremities: All extremities are intact.  Skin: Skin color, texture, turgor normal. No rashes seen  Psych: Appropriate mood and affect. Neurologic: Mental status: Alert, oriented to person, place, and time, thought content appropriate.     Assessment & Plan        Sacrococcygeal pain and deformity after recent fall Recent fall caused sacrococcygeal pain and deformity. Previous fracture in 2014. No surgical intervention typically performed for tailbone fractures. - Ordered sacrococcygeal x-ray to assess current status of tailbone. - Advised use of a donut pillow or soft foam cushion for sitting.  Depression Managed with Lexapro 5 mg daily. Reports improvement in emotional stability and mental clarity. No current need to increase dosage. - Continue Lexapro 5 mg daily. - Provided refill for Lexapro with additional refills as needed.  Attention-deficit  hyperactivity disorder (ADHD) ADHD managed with Adderall. Reports improved clarity of thought with current medication regimen. - Continue current Adderall regimen.         Nancyann Perry, MD  [facility] [phone] (phone) [phone] (fax)  [facility]

## 2024-09-11 ENCOUNTER — Ambulatory Visit: Payer: Self-pay | Admitting: Family Medicine

## 2024-09-18 ENCOUNTER — Telehealth: Payer: Self-pay | Admitting: Family Medicine

## 2024-09-18 DIAGNOSIS — F909 Attention-deficit hyperactivity disorder, unspecified type: Secondary | ICD-10-CM

## 2024-09-18 DIAGNOSIS — F410 Panic disorder [episodic paroxysmal anxiety] without agoraphobia: Secondary | ICD-10-CM

## 2024-09-18 DIAGNOSIS — F411 Generalized anxiety disorder: Secondary | ICD-10-CM

## 2024-09-18 DIAGNOSIS — F3163 Bipolar disorder, current episode mixed, severe, without psychotic features: Secondary | ICD-10-CM

## 2024-09-18 NOTE — Telephone Encounter (Unsigned)
 Copied from CRM #[id number]. Topic: Clinical - Prescription Issue >> Sep 18, 2024  2:46 PM Myrick T wrote: Reason for CRM: patient called stated she need someone to call to help get her oamphetamine-dextroamphetamine  (ADDERALL) 20 MG tablet, escitalopram  (LEXAPRO ) 5 MG tablet and oxyCODONE  (ROXICODONE ) 15 MG immediate release tablet. She doesn't know what she needs to do as she has been trying to get in to see a therapist for medication management but they haven't schedule the appt yet and she is overdue for refills on all the meds she needs. Please f/u with patient

## 2024-09-19 ENCOUNTER — Telehealth: Payer: Self-pay

## 2024-09-19 MED ORDER — ESCITALOPRAM OXALATE 5 MG PO TABS: 5.0000 mg | ORAL_TABLET | Freq: Every day | ORAL | 2 refills | Status: AC

## 2024-09-19 MED ORDER — ALPRAZOLAM 2 MG PO TABS: ORAL_TABLET | ORAL | 2 refills | Status: AC

## 2024-09-19 MED ORDER — AMPHETAMINE-DEXTROAMPHETAMINE 20 MG PO TABS: 20.0000 mg | ORAL_TABLET | Freq: Two times a day (BID) | ORAL | 0 refills | Status: DC

## 2024-09-19 NOTE — Telephone Encounter (Signed)
 Copied from CRM #[id number]. Topic: Clinical - Prescription Issue >> Sep 18, 2024  2:46 PM Myrick T wrote: Reason for CRM: patient called stated she need someone to call to help get her oamphetamine-dextroamphetamine  (ADDERALL) 20 MG tablet, escitalopram  (LEXAPRO ) 5 MG tablet and oxyCODONE  (ROXICODONE ) 15 MG immediate release tablet. She doesn't know what she needs to do as she has been trying to get in to see a therapist for medication management but they haven't schedule the appt yet and she is overdue for refills on all the meds she needs. Please f/u with patient >> Sep 19, 2024  1:48 PM Shardie S wrote: Patient calling back to check the status of refills. States she is out of her medication and still waiting for an appointment with therapist.   [facility] [place], [place] - [address]  [address] [place] [address]  Phone: [phone] Fax: [phone]  Hours: Not open 24 hours

## 2024-09-19 NOTE — Telephone Encounter (Signed)
 [email or web address]_PROVIDER_AND_DEPARTMENT

## 2024-10-03 ENCOUNTER — Other Ambulatory Visit: Payer: Self-pay | Admitting: Family Medicine

## 2024-10-03 DIAGNOSIS — G8929 Other chronic pain: Secondary | ICD-10-CM

## 2024-10-20 ENCOUNTER — Other Ambulatory Visit: Payer: Self-pay | Admitting: Family Medicine

## 2024-10-20 DIAGNOSIS — F909 Attention-deficit hyperactivity disorder, unspecified type: Secondary | ICD-10-CM

## 2024-11-02 ENCOUNTER — Other Ambulatory Visit: Payer: Self-pay | Admitting: Family Medicine

## 2024-11-02 DIAGNOSIS — G8929 Other chronic pain: Secondary | ICD-10-CM

## 2024-11-20 ENCOUNTER — Other Ambulatory Visit: Payer: Self-pay | Admitting: Family Medicine

## 2024-11-20 DIAGNOSIS — F909 Attention-deficit hyperactivity disorder, unspecified type: Secondary | ICD-10-CM
